# Patient Record
Sex: Male | Born: 1960 | Race: Black or African American | Hispanic: No | Marital: Married | Smoking: Never smoker
Health system: Southern US, Community
[De-identification: ages and names within clinical notes are randomized; demographics above are authoritative.]

## PROBLEM LIST (undated history)

## (undated) DIAGNOSIS — E119 Type 2 diabetes mellitus without complications: Secondary | ICD-10-CM

## (undated) DIAGNOSIS — E669 Obesity, unspecified: Secondary | ICD-10-CM

## (undated) DIAGNOSIS — L02419 Cutaneous abscess of limb, unspecified: Secondary | ICD-10-CM

## (undated) DIAGNOSIS — J40 Bronchitis, not specified as acute or chronic: Secondary | ICD-10-CM

## (undated) DIAGNOSIS — L089 Local infection of the skin and subcutaneous tissue, unspecified: Secondary | ICD-10-CM

## (undated) DIAGNOSIS — L03119 Cellulitis of unspecified part of limb: Secondary | ICD-10-CM

## (undated) DIAGNOSIS — I10 Essential (primary) hypertension: Secondary | ICD-10-CM

---

## 2004-11-04 ENCOUNTER — Emergency Department: Payer: Self-pay | Admitting: Emergency Medicine

## 2005-08-26 ENCOUNTER — Emergency Department: Payer: Self-pay | Admitting: Emergency Medicine

## 2007-06-30 ENCOUNTER — Emergency Department: Payer: Self-pay | Admitting: Internal Medicine

## 2008-12-23 ENCOUNTER — Inpatient Hospital Stay: Payer: Self-pay | Admitting: Specialist

## 2010-09-07 ENCOUNTER — Emergency Department: Payer: Self-pay | Admitting: Unknown Physician Specialty

## 2011-10-12 ENCOUNTER — Emergency Department (INDEPENDENT_AMBULATORY_CARE_PROVIDER_SITE_OTHER)
Admission: EM | Admit: 2011-10-12 | Discharge: 2011-10-12 | Disposition: A | Discharge status: Home / Self Care | Payer: Self-pay | Source: Home / Self Care | Attending: Emergency Medicine | Admitting: Emergency Medicine

## 2011-10-12 ENCOUNTER — Encounter (HOSPITAL_COMMUNITY): Payer: Self-pay

## 2011-10-12 DIAGNOSIS — L089 Local infection of the skin and subcutaneous tissue, unspecified: Secondary | ICD-10-CM

## 2011-10-12 HISTORY — DX: Local infection of the skin and subcutaneous tissue, unspecified: L08.9

## 2011-10-12 HISTORY — DX: Essential (primary) hypertension: I10

## 2011-10-12 MED ORDER — MUPIROCIN 2 % EX OINT: TOPICAL_OINTMENT | Freq: Three times a day (TID) | CUTANEOUS | Status: AC

## 2011-10-12 MED ORDER — SULFAMETHOXAZOLE-TRIMETHOPRIM 800-160 MG PO TABS: 1.0000 | ORAL_TABLET | Freq: Two times a day (BID) | ORAL | Status: AC

## 2011-10-12 MED ORDER — CHLORHEXIDINE GLUCONATE 4 % EX LIQD: 60.0000 mL | Freq: Every day | CUTANEOUS | Status: AC | PRN

## 2011-10-12 NOTE — Discharge Instructions (Signed)
Go to www.goodrx.com to look up your medications. This will give you a list of where you can find your prescriptions at the most affordable prices.   RESOURCE GUIDE   Insufficient Money for Medicine Contact [employer]:  call "211" or [facility] [phone].  No Primary Care Doctor Call [facility]  [phone] Other agencies that provide inexpensive medical care    [facility]  [phone]    [facility]  [phone]    [facility]  [phone]   [facility] [phone]   [address] [place] [address] [place], [place] [address]  [facility] Resources  [facility]     [employer]                          [facility] Dept. [address] [place]                       [address]      [address] [place] [address]   [phone] (After Hours)  .   Call the family practice center first thing tomorrow. They take the first 60 people as patients. You do not need insurance Return here or follow up with a doctor of your choice 2 days for a wound check if you are not getting significantly better. Return sooner if you get worse, have a fever >100.4, or any other concerns. Take the medication as written. Take 1 gram of tylenol with the motrin up to 4 times a day as needed for pain and fever. This is an effective combination for pain. Take the hydrocodone/norco/percocet only for severe pain. Do not take the tylenol and hydrocodone/norco/percocet as they both have tylenol in them and too much can hurt your liver. Return if you get worse, have a persistent fever >100.4, or for any concerns.

## 2011-10-12 NOTE — ED Provider Notes (Signed)
History     CSN: [id number]  Arrival date & time 10/12/11  0865   First MD Initiated Contact with Patient 10/12/11 1918      Chief Complaint  Patient presents with  . Recurrent Skin Infections    (Consider location/radiation/quality/duration/timing/severity/associated sxs/prior treatment) HPI Comments: Patient reports multiple, tender areas of erythema, starting 2 weeks ago. Initial lesion started on right lower abdomen, followed by 2 lesions on his right upper thigh. Has been applying warm compresses, and several these lesions have been draining well. Patient states that they are getting better, but that he is getting another one. Patient has a history of skin infections in this area, he states that it was negative for MRSA. Patient is not diabetic. No nausea, vomiting, fevers. No sensation of being bitten at night  ROS as noted in HPI. All other ROS negative.   Patient is a 51 y.o. male presenting with rash. The history is provided by the patient. No language interpreter was used.  Rash     Past Medical History  Diagnosis Date  . Hypertension   . Skin infection     History reviewed. No pertinent past surgical history.  History reviewed. No pertinent family history.  History  Substance Use Topics  . Smoking status: Never Smoker   . Smokeless tobacco: Not on file  . Alcohol Use: No      Review of Systems  Skin: Positive for rash.    Allergies  Review of patient's allergies indicates no known allergies.  Home Medications   Current Outpatient Rx  Name Route Sig Dispense Refill  . CHLORHEXIDINE GLUCONATE 4 % EX LIQD Topical Apply 60 mLs (4 application total) topically daily as needed. Use daily when bathing for 1-2 weeks 120 mL 0  . MUPIROCIN 2 % EX OINT Topical Apply topically 3 (three) times daily. Apply after warm soak for 10 minutes 22 g 0  . SULFAMETHOXAZOLE-TRIMETHOPRIM 800-160 MG PO TABS Oral Take 1 tablet by mouth 2 (two) times daily. 20 tablet 0    BP  143/89  Pulse 94  Temp(Src) 99.6 F (37.6 C) (Oral)  Resp 20  SpO2 97%  Physical Exam  Nursing note and vitals reviewed. Constitutional: He is oriented to person, place, and time. He appears well-developed and well-nourished.  HENT:  Head: Normocephalic and atraumatic.  Eyes: Conjunctivae and EOM are normal.  Neck: Normal range of motion.  Cardiovascular: Normal rate.   Pulmonary/Chest: Effort normal. No respiratory distress.  Abdominal: He exhibits no distension.  Musculoskeletal: Normal range of motion.  Neurological: He is alert and oriented to person, place, and time.  Skin: Skin is warm and dry.       Several areas of erythema, induration. 2 on right upper thigh, one measuring 2 x 2.5 cm, the other 2 x 5 cm. Both have central scabs. No expressible drainage, central fluctuance. Patient has 2 lesions on right lower abdomen: One measuring 1 x 1.5 cm, the other measuring approximately 2 x 6 cm. Larger lesion has a central, healing ulcer.  Psychiatric: He has a normal mood and affect. His behavior is normal.    ED Course  Procedures (including critical care time)  Labs Reviewed - No data to display No results found.   1. Skin infection       MDM  No previous records available. Patient has multiple skin infections. here does not appear to be anything to I&D at this time. Marked all these with a permanent marker for reference. Will send  home on Bactrim and Bactroban. Will have patient return here if no significant improvement in 2 days. Instructed him to bring the permanent marker with him if he returns and to keep these areas marked for reference.   Luiz Blare, MD 10/12/11 2123

## 2011-10-12 NOTE — ED Notes (Signed)
Skin infections on lower abdominal area x2 and right thigh x 2 for past 2-3 weeks; treated at home w hot compresses and drainage by massage; NAD at present

## 2012-06-05 ENCOUNTER — Emergency Department (HOSPITAL_COMMUNITY)
Admission: EM | Admit: 2012-06-05 | Discharge: 2012-06-05 | Disposition: A | Discharge status: Home / Self Care | Payer: Self-pay | Attending: Emergency Medicine | Admitting: Emergency Medicine

## 2012-06-05 ENCOUNTER — Emergency Department (HOSPITAL_COMMUNITY): Payer: Self-pay

## 2012-06-05 ENCOUNTER — Encounter (HOSPITAL_COMMUNITY): Payer: Self-pay | Admitting: *Deleted

## 2012-06-05 DIAGNOSIS — R509 Fever, unspecified: Secondary | ICD-10-CM | POA: Insufficient documentation

## 2012-06-05 DIAGNOSIS — J40 Bronchitis, not specified as acute or chronic: Secondary | ICD-10-CM | POA: Insufficient documentation

## 2012-06-05 DIAGNOSIS — J3489 Other specified disorders of nose and nasal sinuses: Secondary | ICD-10-CM | POA: Insufficient documentation

## 2012-06-05 DIAGNOSIS — R05 Cough: Secondary | ICD-10-CM | POA: Insufficient documentation

## 2012-06-05 DIAGNOSIS — Z872 Personal history of diseases of the skin and subcutaneous tissue: Secondary | ICD-10-CM | POA: Insufficient documentation

## 2012-06-05 DIAGNOSIS — R059 Cough, unspecified: Secondary | ICD-10-CM | POA: Insufficient documentation

## 2012-06-05 DIAGNOSIS — J069 Acute upper respiratory infection, unspecified: Secondary | ICD-10-CM | POA: Insufficient documentation

## 2012-06-05 DIAGNOSIS — I10 Essential (primary) hypertension: Secondary | ICD-10-CM | POA: Insufficient documentation

## 2012-06-05 MED ORDER — BENZONATATE 100 MG PO CAPS
100.0000 mg | ORAL_CAPSULE | Freq: Once | ORAL | Status: AC
  Administered 2012-06-05: 100 mg via ORAL
  Filled 2012-06-05: qty 1

## 2012-06-05 MED ORDER — BENZONATATE 100 MG PO CAPS
100.0000 mg | ORAL_CAPSULE | Freq: Three times a day (TID) | ORAL | Status: DC
Start: 2012-06-05 — End: 2012-07-08

## 2012-06-05 MED ORDER — ALBUTEROL SULFATE HFA 108 (90 BASE) MCG/ACT IN AERS
2.0000 | INHALATION_SPRAY | RESPIRATORY_TRACT | Status: DC | PRN
  Administered 2012-06-05: 2 via RESPIRATORY_TRACT
  Filled 2012-06-05: qty 6.7

## 2012-06-05 MED ORDER — HYDROCOD POLST-CHLORPHEN POLST 10-8 MG/5ML PO LQCR: 5.0000 mL | Freq: Two times a day (BID) | ORAL | Status: DC

## 2012-06-05 NOTE — ED Notes (Signed)
Pt reports cough for about four to five days.  Stated he has been taking mucinex, robitussin, and cough drops with no relief. Also reports he has been having a productive cough with brown sputum.

## 2012-06-05 NOTE — ED Provider Notes (Signed)
Medical screening examination/treatment/procedure(s) were performed by non-physician practitioner and as supervising physician I was immediately available for consultation/collaboration.  Flint Melter, MD 06/05/12 [phone]

## 2012-06-05 NOTE — ED Notes (Signed)
The pt has had a cough and a cold for 5 days.  He cannot sleep at night  From coughing

## 2012-06-05 NOTE — ED Notes (Signed)
Pt returned from radiology.

## 2012-06-05 NOTE — ED Provider Notes (Signed)
History     CSN: [id number]  Arrival date & time 06/05/12  0128   First MD Initiated Contact with Patient 06/05/12 0145      Chief Complaint  Patient presents with  . Cough   HPI  History provided by the patient. Patient is a 51 year old African American male with history of hypertension who presents with complaints of continued cough with nasal congestion rhinorrhea for the past 5 days. Symptoms have been waxing and waning some with worsening cough symptoms at night when lying down. Patient has been using over-the-counter Robitussin which seems to help during the days but is not working well at night. Over the past one to 2 days patient also has had some episodes of subjective fevers and chills. He has not taken his temperature at home. He denies any known sick contacts. Denies any recent travel. Denies any other aggravating or alleviating factors. Denies any associated vomiting or diarrhea symptoms.    Past Medical History  Diagnosis Date  . Hypertension   . Skin infection     History reviewed. No pertinent past surgical history.  No family history on file.  History  Substance Use Topics  . Smoking status: Never Smoker   . Smokeless tobacco: Not on file  . Alcohol Use: No      Review of Systems  Constitutional: Positive for fever and chills. Negative for diaphoresis and appetite change.  HENT: Positive for congestion and rhinorrhea. Negative for sore throat.   Respiratory: Positive for cough. Negative for shortness of breath.   Cardiovascular: Negative for chest pain and palpitations.  Gastrointestinal: Negative for vomiting, abdominal pain and diarrhea.  Skin: Negative for rash.  All other systems reviewed and are negative.    Allergies  Review of patient's allergies indicates no known allergies.  Home Medications  No current outpatient prescriptions on file.  BP 144/88  Pulse 85  Temp 97.5 F (36.4 C) (Oral)  Resp 18  SpO2 97%  Physical Exam  Nursing  note and vitals reviewed. Constitutional: He is oriented to person, place, and time. He appears well-developed and well-nourished. No distress.  HENT:  Head: Normocephalic.  Right Ear: Tympanic membrane normal.  Left Ear: Tympanic membrane normal.  Mouth/Throat: Oropharynx is clear and moist.  Neck: Normal range of motion. Neck supple.       No meningeal signs  Cardiovascular: Normal rate and regular rhythm.   No murmur heard. Pulmonary/Chest: Breath sounds normal. No respiratory distress. He has no wheezes. He has no rales.       Occasional coughing  Neurological: He is alert and oriented to person, place, and time.  Skin: Skin is warm. No rash noted.  Psychiatric: He has a normal mood and affect. His behavior is normal.    ED Course  Procedures    Dg Chest 2 View  06/05/2012  *RADIOLOGY REPORT*  Clinical Data: Productive cough for 5 days.  Fever.  CHEST - 2 VIEW  Comparison: None.  Findings: The heart size and pulmonary vascularity are normal. The lungs appear clear and expanded without focal air space disease or consolidation. No blunting of the costophrenic angles.  No pneumothorax.  Mediastinal contours appear intact.  Degenerative changes in the spine.  IMPRESSION: No evidence of active pulmonary disease.   Original Report Authenticated By: Burman Nieves, M.D.      1. Cough   2. URI (upper respiratory infection)   3. Bronchitis       MDM  2:05 AM patient seen and evaluated.  Patient appears well in no respiratory distress. Normal respirations and O2 sats.        Angus Seller, PA 06/05/12 0530

## 2012-06-14 ENCOUNTER — Emergency Department (HOSPITAL_COMMUNITY)
Admission: EM | Admit: 2012-06-14 | Discharge: 2012-06-15 | Disposition: A | Discharge status: Home / Self Care | Payer: Self-pay | Attending: Emergency Medicine | Admitting: Emergency Medicine

## 2012-06-14 ENCOUNTER — Encounter (HOSPITAL_COMMUNITY): Payer: Self-pay | Admitting: *Deleted

## 2012-06-14 ENCOUNTER — Emergency Department (HOSPITAL_COMMUNITY): Payer: Self-pay

## 2012-06-14 DIAGNOSIS — L089 Local infection of the skin and subcutaneous tissue, unspecified: Secondary | ICD-10-CM | POA: Insufficient documentation

## 2012-06-14 DIAGNOSIS — J40 Bronchitis, not specified as acute or chronic: Secondary | ICD-10-CM | POA: Insufficient documentation

## 2012-06-14 DIAGNOSIS — I10 Essential (primary) hypertension: Secondary | ICD-10-CM | POA: Insufficient documentation

## 2012-06-14 DIAGNOSIS — Z792 Long term (current) use of antibiotics: Secondary | ICD-10-CM | POA: Insufficient documentation

## 2012-06-14 MED ORDER — AZITHROMYCIN 250 MG PO TABS: 250.0000 mg | ORAL_TABLET | Freq: Every day | ORAL | Status: DC

## 2012-06-14 MED ORDER — LISINOPRIL 10 MG PO TABS: 10.0000 mg | ORAL_TABLET | Freq: Every day | ORAL | Status: DC

## 2012-06-14 MED ORDER — ALBUTEROL SULFATE HFA 108 (90 BASE) MCG/ACT IN AERS: 2.0000 | INHALATION_SPRAY | RESPIRATORY_TRACT | Status: DC | PRN

## 2012-06-14 NOTE — ED Provider Notes (Signed)
History     CSN: [id number]  Arrival date & time 06/14/12  2124   First MD Initiated Contact with Patient 06/14/12 2346      Chief Complaint  Patient presents with  . Cough    (Consider location/radiation/quality/duration/timing/severity/associated sxs/prior treatment) HPI Comments: Pt presents with 1 week of cough, stuffy nose and congestion in his chest - has no fevers, no sore throat. Sx are mild, persistent and not associated with back or chest pain.  Patient is a 51 y.o. male presenting with cough. The history is provided by the patient.  Cough    Past Medical History  Diagnosis Date  . Hypertension   . Skin infection     History reviewed. No pertinent past surgical history.  History reviewed. No pertinent family history.  History  Substance Use Topics  . Smoking status: Never Smoker   . Smokeless tobacco: Not on file  . Alcohol Use: No      Review of Systems  Respiratory: Positive for cough.   All other systems reviewed and are negative.    Allergies  Oxycodone  Home Medications   Current Outpatient Rx  Name  Route  Sig  Dispense  Refill  . ALBUTEROL SULFATE HFA 108 (90 BASE) MCG/ACT IN AERS   Inhalation   Inhale 2 puffs into the lungs every 4 (four) hours as needed for wheezing or shortness of breath.   1 Inhaler   3   . AZITHROMYCIN 250 MG PO TABS   Oral   Take 1 tablet (250 mg total) by mouth daily. 500mg  PO day 1, then 250mg  PO days 205   6 tablet   0   . BENZONATATE 100 MG PO CAPS   Oral   Take 1 capsule (100 mg total) by mouth every 8 (eight) hours.   21 capsule   0   . NAPROXEN SODIUM 220 MG PO TABS   Oral   Take 440 mg by mouth daily as needed. For pain           BP 173/108  Pulse 96  Temp 98.8 F (37.1 C) (Oral)  Resp 20  SpO2 97%  Physical Exam  Nursing note and vitals reviewed. Constitutional: He appears well-developed and well-nourished. No distress.  HENT:  Head: Normocephalic and atraumatic.  Mouth/Throat:  Oropharynx is clear and moist. No oropharyngeal exudate.       + nasal d/c bialterally, OP clear, MMM  Eyes: Conjunctivae normal and EOM are normal. Pupils are equal, round, and reactive to light. Right eye exhibits no discharge. Left eye exhibits no discharge. No scleral icterus.  Neck: Normal range of motion. Neck supple. No JVD present. No thyromegaly present.  Cardiovascular: Normal rate, regular rhythm, normal heart sounds and intact distal pulses.  Exam reveals no gallop and no friction rub.   No murmur heard. Pulmonary/Chest: Effort normal and breath sounds normal. No respiratory distress. He has no wheezes. He has no rales.  Abdominal: Soft. Bowel sounds are normal. He exhibits no distension and no mass. There is no tenderness.  Musculoskeletal: Normal range of motion. He exhibits no edema and no tenderness.  Lymphadenopathy:    He has no cervical adenopathy.  Neurological: He is alert. Coordination normal.  Skin: Skin is warm and dry. No rash noted. No erythema.  Psychiatric: He has a normal mood and affect. His behavior is normal.    ED Course  Procedures (including critical care time)  Labs Reviewed - No data to display Dg Chest 2  View  06/14/2012  *RADIOLOGY REPORT*  Clinical Data: Productive cough.  CHEST - 2 VIEW  Comparison:  06/05/2012  Findings:  The heart size and mediastinal contours are within normal limits.  Both lungs are clear.  The visualized skeletal structures are unremarkable.  IMPRESSION: No active cardiopulmonary disease.   Original Report Authenticated By: Myles Rosenthal, M.D.      1. Bronchitis       MDM  Well appaering, normal VS other than Htn, needs to have this f/u - he does have hx of htn and is currently unmedicated - he agrees to start meds and have f/u BP in next 2 weeks.  Zithromax Lisinopril Albuterol MDI        Vida Roller, MD 06/14/12 2356

## 2012-06-14 NOTE — ED Notes (Signed)
Pt reports having a cough for the past 5 days; pt was febrile earlier this week but not presently; pt reports sputum being brown in the beginning but is now yellow; dry productive cough present on assessment; nad.

## 2012-06-14 NOTE — ED Notes (Signed)
Patient with a week of coughing.  States that he was here a week ago for the same and seems like it getting course and his voice is hoarse.

## 2012-07-08 ENCOUNTER — Encounter (HOSPITAL_COMMUNITY): Payer: Self-pay | Admitting: *Deleted

## 2012-07-08 ENCOUNTER — Emergency Department (HOSPITAL_COMMUNITY): Payer: Self-pay

## 2012-07-08 ENCOUNTER — Emergency Department (HOSPITAL_COMMUNITY)
Admission: EM | Admit: 2012-07-08 | Discharge: 2012-07-08 | Disposition: A | Discharge status: Home / Self Care | Payer: Self-pay | Attending: Emergency Medicine | Admitting: Emergency Medicine

## 2012-07-08 DIAGNOSIS — I10 Essential (primary) hypertension: Secondary | ICD-10-CM | POA: Insufficient documentation

## 2012-07-08 DIAGNOSIS — Z8619 Personal history of other infectious and parasitic diseases: Secondary | ICD-10-CM | POA: Insufficient documentation

## 2012-07-08 DIAGNOSIS — J40 Bronchitis, not specified as acute or chronic: Secondary | ICD-10-CM | POA: Insufficient documentation

## 2012-07-08 DIAGNOSIS — Z791 Long term (current) use of non-steroidal anti-inflammatories (NSAID): Secondary | ICD-10-CM | POA: Insufficient documentation

## 2012-07-08 DIAGNOSIS — J209 Acute bronchitis, unspecified: Secondary | ICD-10-CM

## 2012-07-08 DIAGNOSIS — Z79899 Other long term (current) drug therapy: Secondary | ICD-10-CM | POA: Insufficient documentation

## 2012-07-08 DIAGNOSIS — R062 Wheezing: Secondary | ICD-10-CM | POA: Insufficient documentation

## 2012-07-08 MED ORDER — ALBUTEROL SULFATE (5 MG/ML) 0.5% IN NEBU
5.0000 mg | INHALATION_SOLUTION | Freq: Once | RESPIRATORY_TRACT | Status: AC
  Administered 2012-07-08: 5 mg via RESPIRATORY_TRACT
  Filled 2012-07-08: qty 1

## 2012-07-08 MED ORDER — ALBUTEROL SULFATE HFA 108 (90 BASE) MCG/ACT IN AERS: 1.0000 | INHALATION_SPRAY | Freq: Four times a day (QID) | RESPIRATORY_TRACT | Status: DC | PRN

## 2012-07-08 MED ORDER — PREDNISONE 20 MG PO TABS
60.0000 mg | ORAL_TABLET | Freq: Once | ORAL | Status: AC
  Administered 2012-07-08: 60 mg via ORAL
  Filled 2012-07-08: qty 3

## 2012-07-08 MED ORDER — PREDNISONE 50 MG PO TABS: 50.0000 mg | ORAL_TABLET | Freq: Every day | ORAL | Status: DC

## 2012-07-08 MED ORDER — BENZONATATE 100 MG PO CAPS
100.0000 mg | ORAL_CAPSULE | Freq: Three times a day (TID) | ORAL | Status: DC
Start: 2012-07-08 — End: 2012-09-07

## 2012-07-08 MED ORDER — IPRATROPIUM BROMIDE 0.02 % IN SOLN
0.5000 mg | Freq: Once | RESPIRATORY_TRACT | Status: AC
  Administered 2012-07-08: 0.5 mg via RESPIRATORY_TRACT
  Filled 2012-07-08: qty 2.5

## 2012-07-08 NOTE — ED Notes (Signed)
Pt reports here for same as last time.  States that he has had a cough x several weeks.  Reports that he is coughing up white clear sputum.  Pt has been taking medication as he should.

## 2012-07-08 NOTE — ED Notes (Signed)
Pt reports continuous wheezing and coughing with white mucous. A&Ox4, ambulatory, nad.

## 2012-07-08 NOTE — ED Provider Notes (Signed)
History  This chart was scribed for Christopher Racer, MD by Bennett Scrape, ED Scribe. This patient was seen in room TR09C/TR09C and the patient's care was started at 11:53 AM.   CSN: [id number]  Arrival date & time 07/08/12  1121   First MD Initiated Contact with Patient 07/08/12 1153      Chief Complaint  Patient presents with  . Cough     Patient is a 51 y.o. male presenting with cough. The history is provided by the patient. No language interpreter was used.  Cough This is a new problem. The current episode started more than 1 week ago. The problem occurs constantly. The problem has not changed since onset.The cough is productive of sputum. There has been no fever. Associated symptoms include wheezing. Pertinent negatives include no chills and no shortness of breath.    [name] is a 51 y.o. male who presents to the Emergency Department complaining of one month of gradual onset, non-changing, constant productive cough of white phlegm with associated wheezing that started with a prior URI. The other symptoms have since resolved, but pt reports an on-going cough that is worse at night. He states that he has prior bronchitis diagnoses with similar symptoms, usually once every year. He denies nausea and emesis as associated symptoms. He has a h/o HTN and recently started lisinopril 2 weeks ago. He denies smoking and alcohol use.   Past Medical History  Diagnosis Date  . Hypertension   . Skin infection     History reviewed. No pertinent past surgical history.  History reviewed. No pertinent family history.  History  Substance Use Topics  . Smoking status: Never Smoker   . Smokeless tobacco: Not on file  . Alcohol Use: No      Review of Systems  Constitutional: Negative for fever and chills.  Respiratory: Positive for cough and wheezing. Negative for shortness of breath.   Gastrointestinal: Negative for nausea and vomiting.  All other systems reviewed and are  negative.    Allergies  Oxycodone  Home Medications   Current Outpatient Rx  Name  Route  Sig  Dispense  Refill  . ALBUTEROL SULFATE HFA 108 (90 BASE) MCG/ACT IN AERS   Inhalation   Inhale 2 puffs into the lungs every 6 (six) hours as needed. For shortness of breath         . LISINOPRIL 10 MG PO TABS   Oral   Take 1 tablet (10 mg total) by mouth daily.   30 tablet   1   . NAPROXEN SODIUM 220 MG PO TABS   Oral   Take 440 mg by mouth daily as needed. For pain         . ALBUTEROL SULFATE HFA 108 (90 BASE) MCG/ACT IN AERS   Inhalation   Inhale 1-2 puffs into the lungs every 6 (six) hours as needed for wheezing.   1 Inhaler   0   . BENZONATATE 100 MG PO CAPS   Oral   Take 1 capsule (100 mg total) by mouth every 8 (eight) hours.   21 capsule   0   . PREDNISONE 50 MG PO TABS   Oral   Take 1 tablet (50 mg total) by mouth daily.   5 tablet   0     Triage Vitals: BP 165/102  Pulse 77  Temp 98.1 F (36.7 C) (Oral)  Resp 16  SpO2 98%  Physical Exam  Nursing note and vitals reviewed. Constitutional: He is oriented  to person, place, and time. He appears well-developed and well-nourished. No distress.  HENT:  Head: Normocephalic and atraumatic.  Eyes: EOM are normal.  Neck: Neck supple. No tracheal deviation present.  Cardiovascular: Normal rate and regular rhythm.   Pulmonary/Chest: Effort normal. No respiratory distress. He has wheezes (very mild left-sided expiratory wheeze). He has no rales.       cough heard on expiratory phase  Musculoskeletal: Normal range of motion.  Neurological: He is alert and oriented to person, place, and time.  Skin: Skin is warm and dry.  Psychiatric: He has a normal mood and affect. His behavior is normal.    ED Course  Procedures (including critical care time)  DIAGNOSTIC STUDIES: Oxygen Saturation is 98% on room air, normal by my interpretation.    COORDINATION OF CARE: 12:05 PM- Discussed treatment plan which includes  CXR, albuterol inhaler, steroids and a breathing treatment with pt at bedside and pt agreed to plan.  12:15 PM- Ordered 60 mg prednisone tablet, 5 mg of 0.5% albuterol nebulizer solution and 0.5 mg of Atrovent solution.  1:13 PM- Advised pt of radiology results. Discussed discharge plan which includes an albuterol inhaler and steriods with pt and pt agreed to plan. Also advised pt to follow up and pt agreed.  1:15 PM- Prescribed 50 mg prednisone tablets, 100 mg tessalon capsules and albuterol inhaler  Labs Reviewed - No data to display Dg Chest 2 View  07/08/2012  *RADIOLOGY REPORT*  Clinical Data: Productive cough, congestion.  CHEST - 2 VIEW  Comparison: 06/14/2012  Findings: Mild peribronchial thickening.  Slight right infrahilar density, more prominent since prior study.  This could represent atelectasis or developing infiltrate.  No confluent opacity on the left.  No effusions or acute bony abnormality.  Heart is upper limits normal in size.  IMPRESSION: Bronchitic changes.  Slight right infrahilar atelectasis or infiltrate.   Original Report Authenticated By: Charlett Nose, M.D.      1. Bronchitis with bronchospasm       MDM  I personally performed the services described in this documentation, which was scribed in my presence. The recorded information has been reviewed and is accurate.     Christopher Racer, MD 07/08/12 [phone]

## 2012-07-08 NOTE — ED Notes (Signed)
Pt returned from xray

## 2012-09-07 ENCOUNTER — Emergency Department (HOSPITAL_COMMUNITY): Payer: Self-pay

## 2012-09-07 ENCOUNTER — Encounter (HOSPITAL_COMMUNITY): Payer: Self-pay | Admitting: Adult Health

## 2012-09-07 ENCOUNTER — Emergency Department (HOSPITAL_COMMUNITY)
Admission: EM | Admit: 2012-09-07 | Discharge: 2012-09-07 | Disposition: A | Discharge status: Home / Self Care | Payer: Self-pay | Attending: Emergency Medicine | Admitting: Emergency Medicine

## 2012-09-07 DIAGNOSIS — Z79899 Other long term (current) drug therapy: Secondary | ICD-10-CM | POA: Insufficient documentation

## 2012-09-07 DIAGNOSIS — R220 Localized swelling, mass and lump, head: Secondary | ICD-10-CM | POA: Insufficient documentation

## 2012-09-07 DIAGNOSIS — J40 Bronchitis, not specified as acute or chronic: Secondary | ICD-10-CM | POA: Insufficient documentation

## 2012-09-07 DIAGNOSIS — IMO0002 Reserved for concepts with insufficient information to code with codable children: Secondary | ICD-10-CM | POA: Insufficient documentation

## 2012-09-07 DIAGNOSIS — J209 Acute bronchitis, unspecified: Secondary | ICD-10-CM

## 2012-09-07 DIAGNOSIS — R05 Cough: Secondary | ICD-10-CM | POA: Insufficient documentation

## 2012-09-07 DIAGNOSIS — Z872 Personal history of diseases of the skin and subcutaneous tissue: Secondary | ICD-10-CM | POA: Insufficient documentation

## 2012-09-07 DIAGNOSIS — K1120 Sialoadenitis, unspecified: Secondary | ICD-10-CM | POA: Insufficient documentation

## 2012-09-07 DIAGNOSIS — I10 Essential (primary) hypertension: Secondary | ICD-10-CM | POA: Insufficient documentation

## 2012-09-07 DIAGNOSIS — R059 Cough, unspecified: Secondary | ICD-10-CM | POA: Insufficient documentation

## 2012-09-07 LAB — CBC
HCT: 40.3 % (ref 39.0–52.0)
Hemoglobin: 14 g/dL (ref 13.0–17.0)
MCH: 29.9 pg (ref 26.0–34.0)
MCHC: 34.7 g/dL (ref 30.0–36.0)
MCV: 86.1 fL (ref 78.0–100.0)
RBC: 4.68 MIL/uL (ref 4.22–5.81)

## 2012-09-07 LAB — BASIC METABOLIC PANEL
BUN: 12 mg/dL (ref 6–23)
CO2: 31 mEq/L (ref 19–32)
Calcium: 8.9 mg/dL (ref 8.4–10.5)
Chloride: 103 mEq/L (ref 96–112)
Creatinine, Ser: 0.72 mg/dL (ref 0.50–1.35)
GFR calc Af Amer: >90 mL/min (ref >90)
GFR calc non Af Amer: >90 mL/min (ref >90)
Glucose, Bld: 109 mg/dL — ABNORMAL HIGH (ref 70–99)
Potassium: 3.8 mEq/L (ref 3.5–5.1)
Sodium: 140 mEq/L (ref 135–145)

## 2012-09-07 LAB — POCT I-STAT TROPONIN I: Troponin i, poc: 0.01 ng/mL (ref 0.00–0.08)

## 2012-09-07 MED ORDER — IPRATROPIUM BROMIDE 0.02 % IN SOLN
0.5000 mg | Freq: Once | RESPIRATORY_TRACT | Status: AC
  Administered 2012-09-07: 0.5 mg via RESPIRATORY_TRACT
  Filled 2012-09-07: qty 2.5

## 2012-09-07 MED ORDER — ALBUTEROL SULFATE HFA 108 (90 BASE) MCG/ACT IN AERS: 1.0000 | INHALATION_SPRAY | Freq: Four times a day (QID) | RESPIRATORY_TRACT | Status: DC | PRN

## 2012-09-07 MED ORDER — ALBUTEROL SULFATE (5 MG/ML) 0.5% IN NEBU
5.0000 mg | INHALATION_SOLUTION | Freq: Once | RESPIRATORY_TRACT | Status: AC
  Administered 2012-09-07: 5 mg via RESPIRATORY_TRACT
  Filled 2012-09-07: qty 1

## 2012-09-07 MED ORDER — PREDNISONE 20 MG PO TABS
60.0000 mg | ORAL_TABLET | Freq: Once | ORAL | Status: AC
  Administered 2012-09-07: 60 mg via ORAL
  Filled 2012-09-07: qty 3

## 2012-09-07 MED ORDER — PREDNISONE 50 MG PO TABS: 50.0000 mg | ORAL_TABLET | Freq: Every day | ORAL | Status: DC

## 2012-09-07 MED ORDER — IOHEXOL 300 MG/ML  SOLN
75.0000 mL | Freq: Once | INTRAMUSCULAR | Status: AC | PRN
  Administered 2012-09-07: 75 mL via INTRAVENOUS

## 2012-09-07 NOTE — ED Notes (Signed)
Patients O2 sat down to 81%  Repositioned patient  Wheezing noted while resting.

## 2012-09-07 NOTE — ED Notes (Signed)
Patient complains of SOB since Tuesday. Inspiratory and expiratory Wheezing in all sections of lungs. Patient also complains of right sided facial swelling in cheeks and neck area.  Swelling does not appear to be affecting airway or speech.  Patient stated swelling has gotten progressively worse.

## 2012-09-07 NOTE — ED Provider Notes (Signed)
History    CSN: [id number] Arrival date & time 09/07/12  0029 First MD Initiated Contact with Patient 09/07/12 0049      Chief Complaint  Patient presents with  . Shortness of Breath    HPI Patient presents emergency room with complaints of shortness of breath.  The symptoms started yesterday. He's also been having trouble with cough and he has had some wheezing. Patient has history of bronchitis and takes inhalers occasionally but has never been formally diagnosed with asthma. He denies any smoking history. Patient denies any chest pain, nausea vomiting or diarrhea. He also has noticed some trouble with swelling that started on the right side of his face this afternoon. He does have a bad tooth in that area but has not noticed any drainage and does not feel any pain in the neck area where it is swollen. Past Medical History  Diagnosis Date  . Hypertension   . Skin infection     History reviewed. No pertinent past surgical history.  History reviewed. No pertinent family history.  History  Substance Use Topics  . Smoking status: Never Smoker   . Smokeless tobacco: Not on file  . Alcohol Use: No      Review of Systems  All other systems reviewed and are negative.    Allergies  Oxycodone  Home Medications   Current Outpatient Rx  Name  Route  Sig  Dispense  Refill  . albuterol (PROVENTIL HFA;VENTOLIN HFA) 108 (90 BASE) MCG/ACT inhaler   Inhalation   Inhale 2 puffs into the lungs every 6 (six) hours as needed. For shortness of breath         . albuterol (PROVENTIL HFA;VENTOLIN HFA) 108 (90 BASE) MCG/ACT inhaler   Inhalation   Inhale 1-2 puffs into the lungs every 6 (six) hours as needed for wheezing.   1 Inhaler   0   . benzonatate (TESSALON) 100 MG capsule   Oral   Take 1 capsule (100 mg total) by mouth every 8 (eight) hours.   21 capsule   0   . lisinopril (PRINIVIL,ZESTRIL) 10 MG tablet   Oral   Take 1 tablet (10 mg total) by mouth daily.   30 tablet  1   . naproxen sodium (ANAPROX) 220 MG tablet   Oral   Take 440 mg by mouth daily as needed. For pain         . predniSONE (DELTASONE) 50 MG tablet   Oral   Take 1 tablet (50 mg total) by mouth daily.   5 tablet   0     There were no vitals taken for this visit.  Physical Exam  Nursing note and vitals reviewed. Constitutional: No distress.  Obese  HENT:  Head: Normocephalic and atraumatic.  Right Ear: External ear normal.  Left Ear: External ear normal.  Mouth/Throat: No oropharyngeal exudate.  Dental caries noted, no purulent drainage; induration and swelling right submandibular region, no tenderness or erythema,   Eyes: Conjunctivae are normal. Right eye exhibits no discharge. Left eye exhibits no discharge. No scleral icterus.  Neck: Neck supple. No tracheal deviation present.  Cardiovascular: Normal rate, regular rhythm and intact distal pulses.   Pulmonary/Chest: Effort normal. No accessory muscle usage or stridor. Not tachypneic. No respiratory distress. Decreased breath sounds: end expiration. He has wheezes. He has no rales.  No stridor, trachea midline,   Abdominal: Soft. Bowel sounds are normal. He exhibits no distension. There is no tenderness. There is no rebound and no guarding.  Musculoskeletal: He exhibits no edema and no tenderness.  Neurological: He is alert. He has normal strength. No sensory deficit. Cranial nerve deficit:  no gross defecits noted. He exhibits normal muscle tone. He displays no seizure activity. Coordination normal.  Skin: Skin is warm and dry. No rash noted.  Psychiatric: He has a normal mood and affect.    ED Course  Procedures (including critical care time)  Labs Reviewed  BASIC METABOLIC PANEL - Abnormal; Notable for the following:    Glucose, Bld 109 (*)    All other components within normal limits  CBC - Abnormal; Notable for the following:    Platelets 144 (*)    All other components within normal limits  POCT I-STAT  TROPONIN I   Ct Soft Tissue Neck W Contrast  09/07/2012  *RADIOLOGY REPORT*  Clinical Data: Shortness of breath since Tuesday.  Right facial swelling to the cheek and neck.  Bilateral wheezing.  CT NECK WITH CONTRAST  Technique:  Multidetector CT imaging of the neck was performed with intravenous contrast.  Contrast: 75mL OMNIPAQUE IOHEXOL 300 MG/ML  SOLN  Comparison: None.  Findings: There is mucosal thickening in the maxillary antra on the right and in the ethmoid air cells bilaterally.  No acute air-fluid levels in the paranasal sinuses.  There is asymmetric enlargement of the right parotid gland with infiltration in the subcutaneous fat of the right side of the neck mg centered in the parotid region.  Increased pain and pain and this extends down to the thoracic inlet.  The appearance suggests parotid infection, possibly with cellulitis.  There is no focal abscess within the collection demonstrated.  No significant lymphadenopathy in the neck.  The cervical carotid and jugular vessels are patent and nondisplaced.  The prevertebral and mucosal spaces appear intact. No prevertebral soft tissue swelling.  Epiglottis and aryepiglottic folds are normal.  Homogeneous appearance of the thyroid gland. Lung apices are clear.  Degenerative changes in the cervical spine.  IMPRESSION: Enlarged right parotid gland with soft tissue infiltration and stranding throughout the subcutaneous fat over the right side of the neck.  Changes are consistent with inflammation/infection of the parotid with associated soft tissue reaction versus cellulitis. No drainable fluid collections are demonstrated.   Original Report Authenticated By: Burman Nieves, M.D.      1. Bronchitis with bronchospasm   2. Parotiditis       MDM  Dyspnea Consistent with bronchitis with bronchospasm.  Pt has had episodes like this before but not formally diagnosed with asthma.  Will dc home with albuterol and prednisone.  Parotiditis Doubt  bacterial infection.  No ttp.  Sour lemon Jurek.  Follow up with PCP for recheck.  Monitor for fever, worsening symptoms        Celene Kras, MD 09/07/12 [phone]

## 2012-09-07 NOTE — ED Notes (Signed)
Presents with SOB since Tuesday, denies pain reports cough. Right side of face swelling and induration to cheek and neck. Pt is speaking in full sentences, bilateral inspiratory and expiratory wheezes.  SOb is worse with exertion. Right facial swelling began this afternooon and has gotten progressively larger.

## 2012-10-21 ENCOUNTER — Encounter (HOSPITAL_COMMUNITY): Payer: Self-pay | Admitting: Emergency Medicine

## 2012-10-21 ENCOUNTER — Telehealth (HOSPITAL_COMMUNITY): Payer: Self-pay | Admitting: Emergency Medicine

## 2012-10-21 ENCOUNTER — Emergency Department (HOSPITAL_COMMUNITY)
Admission: EM | Admit: 2012-10-21 | Discharge: 2012-10-21 | Disposition: A | Discharge status: Home / Self Care | Payer: Self-pay | Attending: Emergency Medicine | Admitting: Emergency Medicine

## 2012-10-21 DIAGNOSIS — I10 Essential (primary) hypertension: Secondary | ICD-10-CM | POA: Insufficient documentation

## 2012-10-21 DIAGNOSIS — H109 Unspecified conjunctivitis: Secondary | ICD-10-CM | POA: Insufficient documentation

## 2012-10-21 DIAGNOSIS — E669 Obesity, unspecified: Secondary | ICD-10-CM | POA: Insufficient documentation

## 2012-10-21 DIAGNOSIS — Z872 Personal history of diseases of the skin and subcutaneous tissue: Secondary | ICD-10-CM | POA: Insufficient documentation

## 2012-10-21 DIAGNOSIS — Z79899 Other long term (current) drug therapy: Secondary | ICD-10-CM | POA: Insufficient documentation

## 2012-10-21 HISTORY — DX: Obesity, unspecified: E66.9

## 2012-10-21 MED ORDER — NEOMYCIN-POLYMYXIN-HC 3.5-10000-1 OP SUSP: 2.0000 [drp] | Freq: Four times a day (QID) | OPHTHALMIC | Status: DC

## 2012-10-21 NOTE — Progress Notes (Signed)
   CARE MANAGEMENT ED NOTE 10/21/2012  Patient:  [name], [name]   Account Number:  [id number]  Date Initiated:  10/21/2012  Documentation initiated by:  Fransico Michael  Subjective/Objective Assessment:   presented to ED with c/o bilateral eye redness and pain early this am.     Subjective/Objective Assessment Detail:     Action/Plan:   requesting assistance with medications.   Action/Plan Detail:   Anticipated DC Date:  10/21/2012     Status Recommendation to Physician:   Result of Recommendation:      DC Planning Services  CM consult  [facility] Program    Choice offered to / List presented to:            Status of service:  Completed, signed off  ED Comments:   ED Comments Detail:  10/21/12-1331-J.Cindie Rajagopalan,RN,BSN [phone]        Patient enrolled in [facility] program. Letter printed and left with triage nurse for patient to pick up. No further needs identified.  10/21/12-1323-J.Taliya Mcclard,RN,BSN  [phone]      Called Mr. Wise at [phone] regarding referral. States, " I recently lost my health insurance and have been to several pharmacies trying to find this medicine for less than $100 and can't. I just can't afford it." Explained the [facility] program to patient. Voiced interest in enrolling. Also explained that it is only accessible once a year and that he would have a $3 copay and letter for patient to give with prescription. Voiced understanding. Will enroll patient.  10/21/12-1259-J.Chantil Bari,RN,BSN [phone]      Received call from flow manager with referral for medication assistance for patient who was seen in ED last night.

## 2012-10-21 NOTE — ED Notes (Signed)
Pt reports bilateral eye drainage and inflammation x 3 days. States he has "pink eye".

## 2012-10-21 NOTE — ED Notes (Signed)
PT. REPORTS BILATERAL RED EYES FOR 2 DAYS WITH SLIGHT BLURRED VISION , DENIES INJURY , NO DRAINAGE .

## 2012-10-21 NOTE — ED Provider Notes (Signed)
History     CSN: [id number]  Arrival date & time 10/21/12  0131   None     Chief Complaint  Patient presents with  . Conjunctivitis    (Consider location/radiation/quality/duration/timing/severity/associated sxs/prior treatment) The history is provided by the patient. No language interpreter was used.    SUBJECTIVE:  52 y.o. male with  redness, wattering, discharge and mattering in both eyes for 2 days.  No other symptoms.  No significant prior ophthalmological history. No change in visual acuity, no photophobia, no severe eye pain. No history of previous, no history of allergies.  Denies injury or chemical exposure.       Past Medical History  Diagnosis Date  . Hypertension   . Skin infection   . Obesity     History reviewed. No pertinent past surgical history.  No family history on file.  History  Substance Use Topics  . Smoking status: Never Smoker   . Smokeless tobacco: Not on file  . Alcohol Use: No      Review of Systems  Eyes: Positive for discharge and redness. Negative for photophobia, pain, itching and visual disturbance.    Allergies  Oxycodone  Home Medications   Current Outpatient Rx  Name  Route  Sig  Dispense  Refill  . albuterol (PROVENTIL HFA;VENTOLIN HFA) 108 (90 BASE) MCG/ACT inhaler   Inhalation   Inhale 1-2 puffs into the lungs every 6 (six) hours as needed for wheezing.   1 Inhaler   0   . lisinopril (PRINIVIL,ZESTRIL) 10 MG tablet   Oral   Take 1 tablet (10 mg total) by mouth daily.   30 tablet   1   . naproxen sodium (ANAPROX) 220 MG tablet   Oral   Take 220 mg by mouth 2 (two) times daily as needed (for pain).           BP 143/75  Pulse 78  Temp(Src) 97.6 F (36.4 C) (Oral)  Resp 18  SpO2 99%  Physical Exam CV: RRR, No M/R/G, Peripheral pulses intact. No peripheral edema. Lungs: CTAB Abd: Soft, Non tender, non distended Eyes: both eyes with findings of typical conjunctivitis noted; erythema and discharge.  PERRLA, no foreign body noted. No periorbital cellulitis. The corneas are clear and fundi normal. Visual acuity normal.   ED Course  Procedures (including critical care time)  Labs Reviewed - No data to display No results found.   No diagnosis found.    MDM  Patient with conjunctivitis.  He denies any irritation.  Symptoms consistent with conjunctivitis.  Will discharge with antibiotic eyedrops, although this is likely viral in origin. Patient has no eye discomfort or irritation. No history of HSV, no vesicular eruptions on face or nose. Personal hygiene and frequent handwashing discussed.  Patient advised to followup with ophthalmologist if symptoms persist or worsen in any way including vision change or purulent discharge.  Patient verbalizes understanding and is agreeable with discharge.         Arthor Captain, PA-C 10/21/12 1603

## 2012-10-22 NOTE — ED Provider Notes (Signed)
Medical screening examination/treatment/procedure(s) were performed by non-physician practitioner and as supervising physician I was immediately available for consultation/collaboration.   Hanley Seamen, MD 10/22/12 [id number]

## 2013-03-21 ENCOUNTER — Encounter (HOSPITAL_COMMUNITY): Payer: Self-pay

## 2013-03-21 ENCOUNTER — Emergency Department (HOSPITAL_COMMUNITY)
Admission: EM | Admit: 2013-03-21 | Discharge: 2013-03-21 | Disposition: A | Discharge status: Home / Self Care | Payer: Self-pay | Attending: Emergency Medicine | Admitting: Emergency Medicine

## 2013-03-21 ENCOUNTER — Emergency Department (HOSPITAL_COMMUNITY): Payer: Self-pay

## 2013-03-21 DIAGNOSIS — Z79899 Other long term (current) drug therapy: Secondary | ICD-10-CM | POA: Insufficient documentation

## 2013-03-21 DIAGNOSIS — Z872 Personal history of diseases of the skin and subcutaneous tissue: Secondary | ICD-10-CM | POA: Insufficient documentation

## 2013-03-21 DIAGNOSIS — I10 Essential (primary) hypertension: Secondary | ICD-10-CM | POA: Insufficient documentation

## 2013-03-21 DIAGNOSIS — E669 Obesity, unspecified: Secondary | ICD-10-CM | POA: Insufficient documentation

## 2013-03-21 DIAGNOSIS — J40 Bronchitis, not specified as acute or chronic: Secondary | ICD-10-CM | POA: Insufficient documentation

## 2013-03-21 HISTORY — DX: Bronchitis, not specified as acute or chronic: J40

## 2013-03-21 MED ORDER — CETIRIZINE HCL 10 MG PO TABS: 10.0000 mg | ORAL_TABLET | Freq: Every day | ORAL | Status: DC

## 2013-03-21 MED ORDER — ALBUTEROL SULFATE HFA 108 (90 BASE) MCG/ACT IN AERS
2.0000 | INHALATION_SPRAY | RESPIRATORY_TRACT | Status: DC | PRN
  Administered 2013-03-21: 2 via RESPIRATORY_TRACT
  Filled 2013-03-21: qty 6.7

## 2013-03-21 MED ORDER — GUAIFENESIN 100 MG/5ML PO LIQD: 100.0000 mg | ORAL | Status: DC | PRN

## 2013-03-21 NOTE — ED Provider Notes (Signed)
CSN: [id number]     Arrival date & time 03/21/13  1718 History  This chart was scribed for non-physician practitioner Roxy Horseman, PA-C working with [name]. Oletta Lamas, MD by Danella Maiers, ED Scribe. This patient was seen in room TR10C/TR10C and the patient's care was started at 5:59 PM.    Chief Complaint  Patient presents with  . Bronchitis   The history is provided by the patient. No language interpreter was used.   HPI Comments: [name] is a 52 y.o. male with a history of bronchitis who presents to the Emergency Department complaining of a productive cough with greenish-yellow sputum onset 2 days ago. He reports associated waxing and waning fever at home. He states his symptoms feel the same as when he had bronchitis before and that this is his 3rd time having bronchitis. He hasnt tried anything for the cough but states he is planning on getting Mucinex.   Past Medical History  Diagnosis Date  . Hypertension   . Skin infection   . Obesity   . Bronchitis    History reviewed. No pertinent past surgical history. History reviewed. No pertinent family history. History  Substance Use Topics  . Smoking status: Never Smoker   . Smokeless tobacco: Not on file  . Alcohol Use: No    Review of Systems  All other systems reviewed and are negative.    Allergies  Oxycodone  Home Medications   Current Outpatient Rx  Name  Route  Sig  Dispense  Refill  . albuterol (PROVENTIL HFA;VENTOLIN HFA) 108 (90 BASE) MCG/ACT inhaler   Inhalation   Inhale 1-2 puffs into the lungs every 6 (six) hours as needed for wheezing.   1 Inhaler   0   . lisinopril (PRINIVIL,ZESTRIL) 10 MG tablet   Oral   Take 1 tablet (10 mg total) by mouth daily.   30 tablet   1   . naproxen sodium (ANAPROX) 220 MG tablet   Oral   Take 220 mg by mouth 2 (two) times daily as needed (for pain).         Marland Kitchen neomycin-polymyxin-hydrocortisone (CORTISPORIN) 3.5-10000-1 ophthalmic suspension   Both Eyes    Place 2 drops into both eyes 4 (four) times daily.   7.5 mL   0    BP 162/95  Pulse 90  Temp(Src) 99 F (37.2 C) (Oral)  Resp 18  Ht 5\' 10"  (1.778 m)  Wt 354 lb 3 oz (160.658 kg)  BMI 50.82 kg/m2  SpO2 97% Physical Exam  Nursing note and vitals reviewed. Constitutional: He is oriented to person, place, and time. He appears well-developed and well-nourished. No distress.  HENT:  Head: Normocephalic and atraumatic.  Eyes: EOM are normal.  Neck: Neck supple. No tracheal deviation present.  Cardiovascular: Normal rate, regular rhythm and normal heart sounds.  Exam reveals no gallop and no friction rub.   No murmur heard. Pulmonary/Chest: Effort normal and breath sounds normal. No respiratory distress. He has no wheezes. He has no rales. He exhibits no tenderness.  Musculoskeletal: Normal range of motion.  Neurological: He is alert and oriented to person, place, and time.  Skin: Skin is warm and dry.  Psychiatric: He has a normal mood and affect. His behavior is normal.    ED Course  Procedures (including critical care time) Medications - No data to display  DIAGNOSTIC STUDIES: Oxygen Saturation is 97% on room air, normal by my interpretation.    COORDINATION OF CARE: 6:27 PM- Discussed treatment plan  with pt which includes a CXR. Advised the pt that if he has bronchitis he will be given an inhaler and mucinex. If he has pneumonia he will be given antibiotics. Pt agrees to plan.    Labs Review Labs Reviewed - No data to display Imaging Review Dg Chest 2 View  03/21/2013   *RADIOLOGY REPORT*  Clinical Data: History of bronchitis, hypertension, congestion for 2 days.  CHEST - 2 VIEW  Comparison: 07/08/2012  Findings: Heart size is normal.  There is mild perihilar peribronchial thickening.  There are no focal consolidations or pleural effusions.  Mild mid thoracic degenerative changes are present.  IMPRESSION:  1.  Bronchitic changes. 2. No focal pulmonary abnormality.   Original  Report Authenticated By: Norva Pavlov, M.D.    MDM   1. Bronchitis    Uncomplicated bronchitis.  Inhaler, mucinex, rest and fluids.   I personally performed the services described in this documentation, which was scribed in my presence. The recorded information has been reviewed and is accurate.    Roxy Horseman, PA-C 03/21/13 1939

## 2013-03-21 NOTE — ED Notes (Signed)
Pt c.o productive cough with green/yellow colored mucus x2 days. Pt has a hx of bronchitis and states his symptoms feel the same

## 2013-03-23 NOTE — ED Provider Notes (Signed)
Medical screening examination/treatment/procedure(s) were performed by non-physician practitioner and as supervising physician I was immediately available for consultation/collaboration.  Danasha Melman Y. Koa Palla, MD 03/23/13 2157 

## 2013-09-10 ENCOUNTER — Emergency Department (HOSPITAL_COMMUNITY)
Admission: EM | Admit: 2013-09-10 | Discharge: 2013-09-10 | Disposition: A | Discharge status: Home / Self Care | Payer: Self-pay | Attending: Emergency Medicine | Admitting: Emergency Medicine

## 2013-09-10 ENCOUNTER — Emergency Department (HOSPITAL_COMMUNITY): Payer: Self-pay

## 2013-09-10 ENCOUNTER — Encounter (HOSPITAL_COMMUNITY): Payer: Self-pay | Admitting: Emergency Medicine

## 2013-09-10 DIAGNOSIS — Z872 Personal history of diseases of the skin and subcutaneous tissue: Secondary | ICD-10-CM | POA: Insufficient documentation

## 2013-09-10 DIAGNOSIS — R071 Chest pain on breathing: Secondary | ICD-10-CM | POA: Insufficient documentation

## 2013-09-10 DIAGNOSIS — I10 Essential (primary) hypertension: Secondary | ICD-10-CM | POA: Insufficient documentation

## 2013-09-10 DIAGNOSIS — R0789 Other chest pain: Secondary | ICD-10-CM

## 2013-09-10 DIAGNOSIS — Z79899 Other long term (current) drug therapy: Secondary | ICD-10-CM | POA: Insufficient documentation

## 2013-09-10 LAB — BASIC METABOLIC PANEL
BUN: 8 mg/dL (ref 6–23)
CALCIUM: 9.2 mg/dL (ref 8.4–10.5)
CO2: 26 mEq/L (ref 19–32)
Chloride: 100 mEq/L (ref 96–112)
Creatinine, Ser: 0.64 mg/dL (ref 0.50–1.35)
Glucose, Bld: 105 mg/dL — ABNORMAL HIGH (ref 70–99)
POTASSIUM: 4 meq/L (ref 3.7–5.3)
SODIUM: 135 meq/L — AB (ref 137–147)

## 2013-09-10 LAB — CBC
HEMATOCRIT: 39 % (ref 39.0–52.0)
Hemoglobin: 13.6 g/dL (ref 13.0–17.0)
MCH: 31 pg (ref 26.0–34.0)
MCHC: 34.9 g/dL (ref 30.0–36.0)
MCV: 88.8 fL (ref 78.0–100.0)
Platelets: 143 10*3/uL — ABNORMAL LOW (ref 150–400)
RBC: 4.39 MIL/uL (ref 4.22–5.81)
RDW: 13.1 % (ref 11.5–15.5)
WBC: 7.2 10*3/uL (ref 4.0–10.5)

## 2013-09-10 LAB — TROPONIN I: Troponin I: <0.3 ng/mL (ref <0.30)

## 2013-09-10 MED ORDER — METHOCARBAMOL 500 MG PO TABS: 500.0000 mg | ORAL_TABLET | Freq: Two times a day (BID) | ORAL | Status: DC

## 2013-09-10 MED ORDER — ASPIRIN 81 MG PO CHEW
324.0000 mg | CHEWABLE_TABLET | Freq: Once | ORAL | Status: AC
  Administered 2013-09-10: 324 mg via ORAL
  Filled 2013-09-10: qty 4

## 2013-09-10 MED ORDER — MORPHINE SULFATE 4 MG/ML IJ SOLN: 4.0000 mg | Freq: Once | INTRAMUSCULAR | Status: DC

## 2013-09-10 MED ORDER — ONDANSETRON 4 MG PO TBDP
4.0000 mg | ORAL_TABLET | Freq: Once | ORAL | Status: AC
  Administered 2013-09-10: 4 mg via ORAL
  Filled 2013-09-10: qty 1

## 2013-09-10 MED ORDER — MORPHINE SULFATE 4 MG/ML IJ SOLN
4.0000 mg | Freq: Once | INTRAMUSCULAR | Status: AC
Start: 2013-09-10 — End: 2013-09-10
  Administered 2013-09-10: 4 mg via INTRAMUSCULAR
  Filled 2013-09-10: qty 1

## 2013-09-10 MED ORDER — LISINOPRIL 20 MG PO TABS: 20.0000 mg | ORAL_TABLET | Freq: Every day | ORAL | Status: DC

## 2013-09-10 NOTE — ED Notes (Addendum)
Pt c/o right upper chest pain, describes it as feeling sore and sometimes like a cramp, pain only comes with movement. Pain increases with palpation. Pt reports it started yesterday, denies taking any OTC meds for it. Nad, skin warm and dry, resp e/u.

## 2013-09-10 NOTE — ED Notes (Signed)
Pt c/o right sided chest pain x 2 days. Pt also reports abdominal cramping. Pt has not taken his BP meds in 3-4 months.

## 2013-09-10 NOTE — ED Notes (Signed)
Pt ambulated to restroom, no difficulties. Now talking on phone.

## 2013-09-10 NOTE — ED Provider Notes (Signed)
CSN: [id number]     Arrival date & time 09/10/13  1013 History   First MD Initiated Contact with Patient 09/10/13 1113     Chief Complaint  Patient presents with  . Chest Pain     (Consider location/radiation/quality/duration/timing/severity/associated sxs/prior Treatment) HPI  [name] is a 53 y.o.male with a significant PMH of morbid obesity,hyperlipidemia and hypertension presents to the ER with complaints of right sided chest soreness for two days. He reports that he works for [employer] and has  Only had one day off in 30 days. He says while at work his right pectoral muscle cramped up really tight, this lasted for a couple minutes and then resolved. He reports that he has been getting charlie horses in his abdomen and his extremities lately as well. Since it resolved he has had chest wall tenderness and soreness with movement, pain with exertion. He denies feeling weak, nauseous, diaphoretic, or "ill" when he had the initial pains. He denies ever having chest pains. He denies ever seeing a cardiologist. Denies family history. He has not taken his BP medication in 4 months, he reports no insurance and his prescription ran out. He currently feels mild soreness to the right chest.  Past Medical History  Diagnosis Date  . Hypertension   . Skin infection   . Obesity   . Bronchitis    History reviewed. No pertinent past surgical history. No family history on file. History  Substance Use Topics  . Smoking status: Never Smoker   . Smokeless tobacco: Not on file  . Alcohol Use: No    Review of Systems  The patient denies anorexia, fever, weight loss,, vision loss, decreased hearing, hoarseness, syncope, dyspnea on exertion, peripheral edema, balance deficits, hemoptysis, abdominal pain, melena, hematochezia, severe indigestion/heartburn, hematuria, incontinence, genital sores, muscle weakness, suspicious skin lesions, transient blindness, difficulty walking, depression, unusual weight  change, abnormal bleeding, enlarged lymph nodes, angioedema, and breast masses.   Allergies  Oxycodone  Home Medications   Current Outpatient Rx  Name  Route  Sig  Dispense  Refill  . albuterol (PROVENTIL HFA;VENTOLIN HFA) 108 (90 BASE) MCG/ACT inhaler   Inhalation   Inhale 1-2 puffs into the lungs every 6 (six) hours as needed for wheezing.   1 Inhaler   0    BP 149/78  Pulse 71  Temp(Src) 98.6 F (37 C) (Oral)  Resp 22  Ht 5\' 10"  (1.778 m)  Wt 373 lb 5 oz (169.333 kg)  BMI 53.56 kg/m2  SpO2 97% Physical Exam  Nursing note and vitals reviewed. Constitutional: He appears well-developed and well-nourished. No distress.  HENT:  Head: Normocephalic and atraumatic.  Eyes: Pupils are equal, round, and reactive to light.  Neck: Normal range of motion. Neck supple.  Cardiovascular: Normal rate and regular rhythm.   Pulmonary/Chest: Effort normal. He has no decreased breath sounds. He has no wheezes. He has no rhonchi. He exhibits tenderness (to palpation of the right pectoral muscle.).  Abdominal: Soft.  Musculoskeletal:       Right shoulder: He exhibits tenderness and pain. He exhibits normal range of motion (pain with ROM to right pectoral muscle), no bony tenderness, no crepitus, no spasm, normal pulse and normal strength.       Arms: Neurological: He is alert.  Skin: Skin is warm and dry.      ED Course  Procedures (including critical care time) Labs Review Labs Reviewed  CBC - Abnormal; Notable for the following:    Platelets 143 (*)  All other components within normal limits  BASIC METABOLIC PANEL - Abnormal; Notable for the following:    Sodium 135 (*)    Glucose, Bld 105 (*)    All other components within normal limits  TROPONIN I   Imaging Review Dg Chest 2 View (if Patient Has Fever And/or Copd)  09/10/2013   CLINICAL DATA:  Chest pressure and pain.  Hypertension.  Diabetic.  EXAM: CHEST  2 VIEW  COMPARISON:  DG CHEST 2 VIEW dated 03/21/2013; DG CHEST 2  VIEW dated 06/05/2012  FINDINGS: Lateral view degraded by patient arm position. Moderate lower thoracic spondylosis. Midline trachea. Borderline cardiomegaly. Mediastinal contours otherwise within normal limits. No pleural effusion or pneumothorax. Low lung volumes with resultant pulmonary interstitial prominence.  IMPRESSION: Borderline cardiomegaly and low lung volumes. No acute findings.   Electronically Signed   By: Jeronimo Greaves M.D.   On: 09/10/2013 11:19     EKG Interpretation   Date/Time:  Sunday September 10 2013 10:15:27 EST Ventricular Rate:  87 PR Interval:  138 QRS Duration: 84 QT Interval:  350 QTC Calculation: 421 R Axis:   60 Text Interpretation:  Normal sinus rhythm T wave abnormality, consider  inferior ischemia Abnormal ECG since last tracing no significant change  Confirmed by Effie Shy  MD, Mechele Collin ([id number]) on 09/10/2013 2:23:38 PM      MDM   Final diagnoses:  Chest wall pain  Hypertension    Discussed case with Dr. Effie Shy and reviewed results. Patients pain is very atypical for cardiac chest pain. He needs to see his PCP for follow-up and further management. Will give him a referral. Will start him on Lisinopril and give his a muscle relaxer.  52 y.o.Refujio Pion's evaluation in the Emergency Department is complete. It has been determined that no acute conditions requiring further emergency intervention are present at this time. The patient/guardian have been advised of the diagnosis and plan. We have discussed signs and symptoms that warrant return to the ED, such as changes or worsening in symptoms.  Vital signs are stable at discharge. Filed Vitals:   09/10/13 1300  BP:   Pulse: 71  Temp:   Resp:     Patient/guardian has voiced understanding and agreed to follow-up with the PCP or specialist.     Dorthula Matas, PA-C 09/10/13 1427

## 2013-09-10 NOTE — ED Notes (Signed)
Attempted to draw blood, unsuccessful 

## 2013-09-10 NOTE — ED Notes (Signed)
Called phlebotomy to get pt's blood work, reports she will come as soon as she can.

## 2013-09-10 NOTE — ED Notes (Signed)
[name], NT attempted to draw blood, unsuccessful.

## 2013-09-10 NOTE — Discharge Instructions (Signed)
Chest Wall Pain Chest wall pain is pain in or around the bones and muscles of your chest. It may take up to 6 weeks to get better. It may take longer if you must stay physically active in your work and activities.  CAUSES  Chest wall pain may happen on its own. However, it may be caused by:  A viral illness like the flu.  Injury.  Coughing.  Exercise.  Arthritis.  Fibromyalgia.  Shingles. HOME CARE INSTRUCTIONS   Avoid overtiring physical activity. Try not to strain or perform activities that cause pain. This includes any activities using your chest or your abdominal and side muscles, especially if heavy weights are used.  Put ice on the sore area.  Put ice in a plastic bag.  Place a towel between your skin and the bag.  Leave the ice on for 15-20 minutes per hour while awake for the first 2 days.  Only take over-the-counter or prescription medicines for pain, discomfort, or fever as directed by your caregiver. SEEK IMMEDIATE MEDICAL CARE IF:   Your pain increases, or you are very uncomfortable.  You have a fever.  Your chest pain becomes worse.  You have new, unexplained symptoms.  You have nausea or vomiting.  You feel sweaty or lightheaded.  You have a cough with phlegm (sputum), or you cough up blood. MAKE SURE YOU:   Understand these instructions.  Will watch your condition.  Will get help right away if you are not doing well or get worse. Document Released: 06/29/2005 Document Revised: 09/21/2011 Document Reviewed: 02/23/2011 [facility] Patient Information 2014 [place], [place].  Arterial Hypertension Arterial hypertension (high blood pressure) is a condition of elevated pressure in your blood vessels. Hypertension over a long period of time is a risk factor for strokes, heart attacks, and heart failure. It is also the leading cause of kidney (renal) failure.  CAUSES   In Adults -- Over 90% of all hypertension has no known cause. This is called essential  or primary hypertension. In the other 10% of people with hypertension, the increase in blood pressure is caused by another disorder. This is called secondary hypertension. Important causes of secondary hypertension are:  Heavy alcohol use.  Obstructive sleep apnea.  Hyperaldosterosim (Conn's syndrome).  Steroid use.  Chronic kidney failure.  Hyperparathyroidism.  Medications.  Renal artery stenosis.  Pheochromocytoma.  Cushing's disease.  Coarctation of the aorta.  Scleroderma renal crisis.  Licorice (in excessive amounts).  Drugs (cocaine, methamphetamine). Your caregiver can explain any items above that apply to you.  In Children -- Secondary hypertension is more common and should always be considered.  Pregnancy -- Few women of childbearing age have high blood pressure. However, up to 10% of them develop hypertension of pregnancy. Generally, this will not harm the woman. It may be a sign of 3 complications of pregnancy: preeclampsia, HELLP syndrome, and eclampsia. Follow up and control with medication is necessary. SYMPTOMS   This condition normally does not produce any noticeable symptoms. It is usually found during a routine exam.  Malignant hypertension is a late problem of high blood pressure. It may have the following symptoms:  Headaches.  Blurred vision.  End-organ damage (this means your kidneys, heart, lungs, and other organs are being damaged).  Stressful situations can increase the blood pressure. If a person with normal blood pressure has their blood pressure go up while being seen by their caregiver, this is often termed "white coat hypertension." Its importance is not known. It may be  related with eventually developing hypertension or complications of hypertension.  Hypertension is often confused with mental tension, stress, and anxiety. DIAGNOSIS  The diagnosis is made by 3 separate blood pressure measurements. They are taken at least 1 week apart  from each other. If there is organ damage from hypertension, the diagnosis may be made without repeat measurements. Hypertension is usually identified by having blood pressure readings:  Above 140/90 mmHg measured in both arms, at 3 separate times, over a couple weeks.  Over 130/80 mmHg should be considered a risk factor and may require treatment in patients with diabetes. Blood pressure readings over 120/80 mmHg are called "pre-hypertension" even in non-diabetic patients. To get a true blood pressure measurement, use the following guidelines. Be aware of the factors that can alter blood pressure readings.  Take measurements at least 1 hour after caffeine.  Take measurements 30 minutes after smoking and without any stress. This is another reason to quit smoking  it raises your blood pressure.  Use a proper cuff size. Ask your caregiver if you are not sure about your cuff size.  Most home blood pressure cuffs are automatic. They will measure systolic and diastolic pressures. The systolic pressure is the pressure reading at the start of sounds. Diastolic pressure is the pressure at which the sounds disappear. If you are elderly, measure pressures in multiple postures. Try sitting, lying or standing.  Sit at rest for a minimum of 5 minutes before taking measurements.  You should not be on any medications like decongestants. These are found in many cold medications.  Record your blood pressure readings and review them with your caregiver. If you have hypertension:  Your caregiver may do tests to be sure you do not have secondary hypertension (see "causes" above).  Your caregiver may also look for signs of metabolic syndrome. This is also called Syndrome X or Insulin Resistance Syndrome. You may have this syndrome if you have type 2 diabetes, abdominal obesity, and abnormal blood lipids in addition to hypertension.  Your caregiver will take your medical and family history and perform a  physical exam.  Diagnostic tests may include blood tests (for glucose, cholesterol, potassium, and kidney function), a urinalysis, or an EKG. Other tests may also be necessary depending on your condition. PREVENTION  There are important lifestyle issues that you can adopt to reduce your chance of developing hypertension:  Maintain a normal weight.  Limit the amount of salt (sodium) in your diet.  Exercise often.  Limit alcohol intake.  Get enough potassium in your diet. Discuss specific advice with your caregiver.  Follow a DASH diet (dietary approaches to stop hypertension). This diet is rich in fruits, vegetables, and low-fat dairy products, and avoids certain fats. PROGNOSIS  Essential hypertension cannot be cured. Lifestyle changes and medical treatment can lower blood pressure and reduce complications. The prognosis of secondary hypertension depends on the underlying cause. Many people whose hypertension is controlled with medicine or lifestyle changes can live a normal, healthy life.  RISKS AND COMPLICATIONS  While high blood pressure alone is not an illness, it often requires treatment due to its short- and long-term effects on many organs. Hypertension increases your risk for:  CVAs or strokes (cerebrovascular accident).  Heart failure due to chronically high blood pressure (hypertensive cardiomyopathy).  Heart attack (myocardial infarction).  Damage to the retina (hypertensive retinopathy).  Kidney failure (hypertensive nephropathy). Your caregiver can explain list items above that apply to you. Treatment of hypertension can significantly reduce the risk  of complications. TREATMENT   For overweight patients, weight loss and regular exercise are recommended. Physical fitness lowers blood pressure.  Mild hypertension is usually treated with diet and exercise. A diet rich in fruits and vegetables, fat-free dairy products, and foods low in fat and salt (sodium) can help lower  blood pressure. Decreasing salt intake decreases blood pressure in a 1/3 of people.  Stop smoking if you are a smoker. The steps above are highly effective in reducing blood pressure. While these actions are easy to suggest, they are difficult to achieve. Most patients with moderate or severe hypertension end up requiring medications to bring their blood pressure down to a normal level. There are several classes of medications for treatment. Blood pressure pills (antihypertensives) will lower blood pressure by their different actions. Lowering the blood pressure by 10 mmHg may decrease the risk of complications by as much as 25%. The goal of treatment is effective blood pressure control. This will reduce your risk for complications. Your caregiver will help you determine the best treatment for you according to your lifestyle. What is excellent treatment for one person, may not be for you. HOME CARE INSTRUCTIONS   Do not smoke.  Follow the lifestyle changes outlined in the "Prevention" section.  If you are on medications, follow the directions carefully. Blood pressure medications must be taken as prescribed. Skipping doses reduces their benefit. It also puts you at risk for problems.  Follow up with your caregiver, as directed.  If you are asked to monitor your blood pressure at home, follow the guidelines in the "Diagnosis" section above. SEEK MEDICAL CARE IF:   You think you are having medication side effects.  You have recurrent headaches or lightheadedness.  You have swelling in your ankles.  You have trouble with your vision. SEEK IMMEDIATE MEDICAL CARE IF:   You have sudden onset of chest pain or pressure, difficulty breathing, or other symptoms of a heart attack.  You have a severe headache.  You have symptoms of a stroke (such as sudden weakness, difficulty speaking, difficulty walking). MAKE SURE YOU:   Understand these instructions.  Will watch your condition.  Will  get help right away if you are not doing well or get worse. Document Released: 06/29/2005 Document Revised: 09/21/2011 Document Reviewed: 01/27/2007 [facility] Patient Information 2014 [place]ExitCare, [place]LLC.

## 2013-09-12 NOTE — ED Provider Notes (Signed)
Medical screening examination/treatment/procedure(s) were performed by non-physician practitioner and as supervising physician I was immediately available for consultation/collaboration.  Delson Dulworth L Agostino Gorin, MD 09/12/13 2033 

## 2013-11-07 ENCOUNTER — Emergency Department (HOSPITAL_COMMUNITY): Payer: Self-pay

## 2013-11-07 ENCOUNTER — Encounter (HOSPITAL_COMMUNITY): Payer: Self-pay | Admitting: Emergency Medicine

## 2013-11-07 ENCOUNTER — Emergency Department (HOSPITAL_COMMUNITY)
Admission: EM | Admit: 2013-11-07 | Discharge: 2013-11-07 | Disposition: A | Discharge status: Home / Self Care | Payer: Self-pay | Attending: Emergency Medicine | Admitting: Emergency Medicine

## 2013-11-07 DIAGNOSIS — Z79899 Other long term (current) drug therapy: Secondary | ICD-10-CM | POA: Insufficient documentation

## 2013-11-07 DIAGNOSIS — J40 Bronchitis, not specified as acute or chronic: Secondary | ICD-10-CM

## 2013-11-07 DIAGNOSIS — Z872 Personal history of diseases of the skin and subcutaneous tissue: Secondary | ICD-10-CM | POA: Insufficient documentation

## 2013-11-07 DIAGNOSIS — E669 Obesity, unspecified: Secondary | ICD-10-CM | POA: Insufficient documentation

## 2013-11-07 DIAGNOSIS — J209 Acute bronchitis, unspecified: Secondary | ICD-10-CM | POA: Insufficient documentation

## 2013-11-07 DIAGNOSIS — I10 Essential (primary) hypertension: Secondary | ICD-10-CM | POA: Insufficient documentation

## 2013-11-07 LAB — CBC WITH DIFFERENTIAL/PLATELET
BASOS ABS: 0 10*3/uL (ref 0.0–0.1)
BASOS PCT: 0 % (ref 0–1)
EOS ABS: 0.2 10*3/uL (ref 0.0–0.7)
EOS PCT: 4 % (ref 0–5)
HEMATOCRIT: 40.9 % (ref 39.0–52.0)
Hemoglobin: 14.1 g/dL (ref 13.0–17.0)
Lymphocytes Relative: 36 % (ref 12–46)
Lymphs Abs: 2.1 10*3/uL (ref 0.7–4.0)
MCH: 30.6 pg (ref 26.0–34.0)
MCHC: 34.5 g/dL (ref 30.0–36.0)
MCV: 88.7 fL (ref 78.0–100.0)
MONO ABS: 0.5 10*3/uL (ref 0.1–1.0)
MONOS PCT: 8 % (ref 3–12)
NEUTROS ABS: 3 10*3/uL (ref 1.7–7.7)
Neutrophils Relative %: 52 % (ref 43–77)
Platelets: 147 10*3/uL — ABNORMAL LOW (ref 150–400)
RBC: 4.61 MIL/uL (ref 4.22–5.81)
RDW: 12.9 % (ref 11.5–15.5)
WBC: 5.8 10*3/uL (ref 4.0–10.5)

## 2013-11-07 LAB — BASIC METABOLIC PANEL
BUN: 8 mg/dL (ref 6–23)
CHLORIDE: 103 meq/L (ref 96–112)
CO2: 26 mEq/L (ref 19–32)
Calcium: 9.4 mg/dL (ref 8.4–10.5)
Creatinine, Ser: 0.65 mg/dL (ref 0.50–1.35)
GFR calc Af Amer: >90 mL/min (ref >90)
GFR calc non Af Amer: >90 mL/min (ref >90)
GLUCOSE: 104 mg/dL — AB (ref 70–99)
Potassium: 4.2 mEq/L (ref 3.7–5.3)
Sodium: 139 mEq/L (ref 137–147)

## 2013-11-07 LAB — PRO B NATRIURETIC PEPTIDE: PRO B NATRI PEPTIDE: 74.4 pg/mL (ref 0–125)

## 2013-11-07 LAB — TROPONIN I: Troponin I: <0.3 ng/mL (ref <0.30)

## 2013-11-07 MED ORDER — SODIUM CHLORIDE 0.9 % IJ SOLN
INTRAMUSCULAR | Status: AC
  Administered 2013-11-07: 3 mL
  Filled 2013-11-07: qty 3

## 2013-11-07 MED ORDER — ALBUTEROL SULFATE HFA 108 (90 BASE) MCG/ACT IN AERS
2.0000 | INHALATION_SPRAY | Freq: Once | RESPIRATORY_TRACT | Status: AC
  Administered 2013-11-07: 2 via RESPIRATORY_TRACT
  Filled 2013-11-07: qty 6.7

## 2013-11-07 MED ORDER — AZITHROMYCIN 250 MG PO TABS: ORAL_TABLET | ORAL | Status: DC

## 2013-11-07 MED ORDER — PREDNISONE 20 MG PO TABS: 40.0000 mg | ORAL_TABLET | Freq: Every day | ORAL | Status: DC

## 2013-11-07 MED ORDER — IPRATROPIUM-ALBUTEROL 0.5-2.5 (3) MG/3ML IN SOLN
3.0000 mL | Freq: Once | RESPIRATORY_TRACT | Status: AC
  Administered 2013-11-07: 3 mL via RESPIRATORY_TRACT
  Filled 2013-11-07: qty 3

## 2013-11-07 MED ORDER — PREDNISONE 20 MG PO TABS
40.0000 mg | ORAL_TABLET | Freq: Once | ORAL | Status: AC
  Administered 2013-11-07: 40 mg via ORAL
  Filled 2013-11-07: qty 2

## 2013-11-07 MED ORDER — AZITHROMYCIN 250 MG PO TABS
500.0000 mg | ORAL_TABLET | Freq: Once | ORAL | Status: AC
  Administered 2013-11-07: 500 mg via ORAL
  Filled 2013-11-07: qty 2

## 2013-11-07 NOTE — Discharge Instructions (Signed)
Rest and push fluids.   For fever and pain control you can take Motrin (ibuprofen) as follows: 400 mg (this is normally 2 over the counter pills) every 4 hours with food.   Return to the emergency room for any worsening or concerning symptoms including fast breathing, heart racing, confusion.    Bronchitis Bronchitis is inflammation of the airways that extend from the windpipe into the lungs (bronchi). The inflammation often causes mucus to develop, which leads to a cough. If the inflammation becomes severe, it may cause shortness of breath. CAUSES  Bronchitis may be caused by:   Viral infections.   Bacteria.   Cigarette smoke.   Allergens, pollutants, and other irritants.  SIGNS AND SYMPTOMS  The most common symptom of bronchitis is a frequent cough that produces mucus. Other symptoms include:  Fever.   Body aches.   Chest congestion.   Chills.   Shortness of breath.   Sore throat.  DIAGNOSIS  Bronchitis is usually diagnosed through a medical history and physical exam. Tests, such as chest X-rays, are sometimes done to rule out other conditions.  TREATMENT  You may need to avoid contact with whatever caused the problem (smoking, for example). Medicines are sometimes needed. These may include:  Antibiotics. These may be prescribed if the condition is caused by bacteria.  Cough suppressants. These may be prescribed for relief of cough symptoms.   Inhaled medicines. These may be prescribed to help open your airways and make it easier for you to breathe.   Steroid medicines. These may be prescribed for those with recurrent (chronic) bronchitis. HOME CARE INSTRUCTIONS  Get plenty of rest.   Drink enough fluids to keep your urine clear or pale yellow (unless you have a medical condition that requires fluid restriction). Increasing fluids may help thin your secretions and will prevent dehydration.   Only take over-the-counter or prescription medicines as  directed by your health care provider.  Only take antibiotics as directed. Make sure you finish them even if you start to feel better.  Avoid secondhand smoke, irritating chemicals, and strong fumes. These will make bronchitis worse. If you are a smoker, quit smoking. Consider using nicotine gum or skin patches to help control withdrawal symptoms. Quitting smoking will help your lungs heal faster.   Put a cool-mist humidifier in your bedroom at night to moisten the air. This may help loosen mucus. Change the water in the humidifier daily. You can also run the hot water in your shower and sit in the bathroom with the door closed for 5 10 minutes.   Follow up with your health care provider as directed.   Wash your hands frequently to avoid catching bronchitis again or spreading an infection to others.  SEEK MEDICAL CARE IF: Your symptoms do not improve after 1 week of treatment.  SEEK IMMEDIATE MEDICAL CARE IF:  Your fever increases.  You have chills.   You have chest pain.   You have worsening shortness of breath.   You have bloody sputum.  You faint.  You have lightheadedness.  You have a severe headache.   You vomit repeatedly. MAKE SURE YOU:   Understand these instructions.  Will watch your condition.  Will get help right away if you are not doing well or get worse. Document Released: 06/29/2005 Document Revised: 04/19/2013 Document Reviewed: 02/21/2013 [facility] Patient Information 2014 [place] VillageExitCare, [place]LL[place].

## 2013-11-07 NOTE — ED Provider Notes (Signed)
CSN: [id number]     Arrival date & time 11/07/13  1723 History   First MD Initiated Contact with Patient 11/07/13 1941     Chief Complaint  Patient presents with  . Shortness of Breath     (Consider location/radiation/quality/duration/timing/severity/associated sxs/prior Treatment) HPI  [name] Needs is a 53 y.o. male complaining of productive cough, shortness of breath and wheezing worsening over the course of 3 days. Pt denies fever, cough, h/o DVT/PE, calf pain or leg swelling, hemoptysis, recent immobilization, cancer/chemotherapy in the last 6 months.    Past Medical History  Diagnosis Date  . Hypertension   . Skin infection   . Obesity   . Bronchitis    History reviewed. No pertinent past surgical history. History reviewed. No pertinent family history. History  Substance Use Topics  . Smoking status: Never Smoker   . Smokeless tobacco: Not on file  . Alcohol Use: No    Review of Systems  10 systems reviewed and found to be negative, except as noted in the HPI.   Allergies  Oxycodone  Home Medications   Prior to Admission medications   Medication Sig Start Date End Date Taking? Authorizing Provider  lisinopril (PRINIVIL,ZESTRIL) 20 MG tablet Take 1 tablet (20 mg total) by mouth daily. 09/10/13  Yes Tiffany Irine SealG Greene, PA-C  naproxen sodium (ANAPROX) 220 MG tablet Take 660 mg by mouth daily as needed (knee pain).   Yes Historical Provider, MD   BP 129/72  Pulse 74  Temp(Src) 98.2 F (36.8 C) (Oral)  Resp 16  Ht 5\' 10"  (1.778 m)  SpO2 98% Physical Exam  Nursing note and vitals reviewed. Constitutional: He is oriented to person, place, and time. He appears well-developed and well-nourished. No distress.  HENT:  Head: Normocephalic and atraumatic.  Mouth/Throat: Oropharynx is clear and moist.  Eyes: Conjunctivae and EOM are normal. Pupils are equal, round, and reactive to light.  Neck: Normal range of motion.  Cardiovascular: Normal rate.   Pulmonary/Chest:  Effort normal. No stridor. No respiratory distress. He has wheezes. He has no rales. He exhibits no tenderness.  No tripoding, no stridor, patient speaking in complete sentences.  Mild diffuse expiratory wheezing with no rales or rhonchi.  Good air movement in all fields.  Abdominal: Soft. Bowel sounds are normal. He exhibits no distension and no mass. There is no tenderness. There is no rebound and no guarding.  Musculoskeletal: Normal range of motion.  Neurological: He is alert and oriented to person, place, and time.  Psychiatric: He has a normal mood and affect.    ED Course  Procedures (including critical care time) Labs Review Labs Reviewed  CBC WITH DIFFERENTIAL - Abnormal; Notable for the following:    Platelets 147 (*)    All other components within normal limits  BASIC METABOLIC PANEL - Abnormal; Notable for the following:    Glucose, Bld 104 (*)    All other components within normal limits  TROPONIN I  PRO B NATRIURETIC PEPTIDE    Imaging Review No results found.   EKG Interpretation   Date/Time:  Tuesday November 07 2013 17:28:08 EDT Ventricular Rate:  90 PR Interval:  140 QRS Duration: 86 QT Interval:  352 QTC Calculation: 430 R Axis:   30 Text Interpretation:  Normal sinus rhythm Moderate voltage criteria for  LVH, may be normal variant T wave abnormality, consider inferior ischemia  Abnormal ECG ED PHYSICIAN INTERPRETATION AVAILABLE IN [facility]  Confirmed by TEST, Record ([id number]) on 11/09/2013 7:30:47 AM  MDM   Final diagnoses:  Bronchitis    Filed Vitals:   11/07/13 2100 11/07/13 2130 11/07/13 2145 11/07/13 2200  BP: 141/64 127/65 125/94 129/72  Pulse: 66 74 85 74  Temp:      TempSrc:      Resp:  13 21 16   Height:      SpO2: 98% 100% 100% 98%    Medications  predniSONE (DELTASONE) tablet 40 mg (40 mg Oral Given 11/07/13 2113)  ipratropium-albuterol (DUONEB) 0.5-2.5 (3) MG/3ML nebulizer solution 3 mL (3 mLs Nebulization Given  11/07/13 2113)  azithromycin (ZITHROMAX) tablet 500 mg (500 mg Oral Given 11/07/13 2145)  sodium chloride 0.9 % injection (3 mLs  Given 11/07/13 2113)  albuterol (PROVENTIL HFA;VENTOLIN HFA) 108 (90 BASE) MCG/ACT inhaler 2 puff (2 puffs Inhalation Given 11/07/13 2210)    [name] [name] is a 53 y.o. male presenting with productive cough, shortness of breath and wheezing. Patient is nonasthmatic. Chest x-ray with no signs of infiltrate. EKG is nonischemic and troponin and BNP are also negative. Patient improved significantly after nebulizer treatment. We'll treat as acute bronchitis with reactive airway.  Evaluation does not show pathology that would require ongoing emergent intervention or inpatient treatment. Pt is hemodynamically stable and mentating appropriately. Discussed findings and plan with patient/guardian, who agrees with care plan. All questions answered. Return precautions discussed and outpatient follow up given.   Discharge Medication List as of 11/07/2013 10:01 PM    START taking these medications   Details  azithromycin (ZITHROMAX Z-PAK) 250 MG tablet 2 po day one, then 1 daily x 4 days, Print    predniSONE (DELTASONE) 20 MG tablet Take 2 tablets (40 mg total) by mouth daily., Starting 11/07/2013, Until Discontinued, Print        Note: Portions of this report may have been transcribed using voice recognition software. Every effort was made to ensure accuracy; however, inadvertent computerized transcription errors may be present    Christopher Emeryicole Collyns Mcquigg, PA-C 11/09/13 1915  Christopher Emeryicole Tudor Chandley, PA-C 11/09/13 [id number]

## 2013-11-07 NOTE — ED Notes (Signed)
PA Nicole at the bedside  

## 2013-11-07 NOTE — ED Notes (Signed)
Reports having sob and wheezing for several days, denies any swelling to extremities, denies hx of asthma. Reports recent cough with brown sputum.

## 2013-11-07 NOTE — ED Provider Notes (Signed)
Medical screening examination/treatment/procedure(s) were conducted as a shared visit with non-physician practitioner(s) and myself.  I personally evaluated the patient during the encounter.   EKG Interpretation None      Date: 11/07/2013  Rate: 90  Rhythm: normal sinus rhythm  QRS Axis: normal  Intervals: normal  ST/T Wave abnormalities: nonspecific T wave changes  Conduction Disutrbances:none  Narrative Interpretation:   Old EKG Reviewed: none available    Toy BakerAnthony T Dasie Chancellor, MD 11/18/13 1515

## 2013-11-09 NOTE — ED Provider Notes (Signed)
Medical screening examination/treatment/procedure(s) were performed by non-physician practitioner and as supervising physician I was immediately available for consultation/collaboration.  Jakaila Norment T Retha Bither, MD 11/09/13 2325 

## 2013-11-24 ENCOUNTER — Emergency Department (HOSPITAL_COMMUNITY)
Admission: EM | Admit: 2013-11-24 | Discharge: 2013-11-24 | Disposition: A | Discharge status: Home / Self Care | Payer: Self-pay | Attending: Emergency Medicine | Admitting: Emergency Medicine

## 2013-11-24 ENCOUNTER — Emergency Department (HOSPITAL_COMMUNITY): Payer: Self-pay

## 2013-11-24 ENCOUNTER — Encounter (HOSPITAL_COMMUNITY): Payer: Self-pay | Admitting: Emergency Medicine

## 2013-11-24 DIAGNOSIS — E669 Obesity, unspecified: Secondary | ICD-10-CM | POA: Insufficient documentation

## 2013-11-24 DIAGNOSIS — I10 Essential (primary) hypertension: Secondary | ICD-10-CM | POA: Insufficient documentation

## 2013-11-24 DIAGNOSIS — R0781 Pleurodynia: Secondary | ICD-10-CM

## 2013-11-24 DIAGNOSIS — W1809XA Striking against other object with subsequent fall, initial encounter: Secondary | ICD-10-CM | POA: Insufficient documentation

## 2013-11-24 DIAGNOSIS — Z8709 Personal history of other diseases of the respiratory system: Secondary | ICD-10-CM | POA: Insufficient documentation

## 2013-11-24 DIAGNOSIS — W19XXXA Unspecified fall, initial encounter: Secondary | ICD-10-CM

## 2013-11-24 DIAGNOSIS — W010XXA Fall on same level from slipping, tripping and stumbling without subsequent striking against object, initial encounter: Secondary | ICD-10-CM | POA: Insufficient documentation

## 2013-11-24 DIAGNOSIS — Y9389 Activity, other specified: Secondary | ICD-10-CM | POA: Insufficient documentation

## 2013-11-24 DIAGNOSIS — S298XXA Other specified injuries of thorax, initial encounter: Secondary | ICD-10-CM | POA: Insufficient documentation

## 2013-11-24 DIAGNOSIS — Y9289 Other specified places as the place of occurrence of the external cause: Secondary | ICD-10-CM | POA: Insufficient documentation

## 2013-11-24 MED ORDER — IPRATROPIUM BROMIDE 0.02 % IN SOLN
0.5000 mg | Freq: Once | RESPIRATORY_TRACT | Status: AC
  Administered 2013-11-24: 0.5 mg via RESPIRATORY_TRACT
  Filled 2013-11-24: qty 2.5

## 2013-11-24 MED ORDER — ALBUTEROL SULFATE (2.5 MG/3ML) 0.083% IN NEBU
5.0000 mg | INHALATION_SOLUTION | Freq: Once | RESPIRATORY_TRACT | Status: AC
  Administered 2013-11-24: 5 mg via RESPIRATORY_TRACT
  Filled 2013-11-24: qty 6

## 2013-11-24 MED ORDER — HYDROCODONE-ACETAMINOPHEN 5-325 MG PO TABS
2.0000 | ORAL_TABLET | Freq: Once | ORAL | Status: AC
  Administered 2013-11-24: 2 via ORAL
  Filled 2013-11-24: qty 2

## 2013-11-24 MED ORDER — HYDROCODONE-ACETAMINOPHEN 5-325 MG PO TABS: 1.0000 | ORAL_TABLET | Freq: Four times a day (QID) | ORAL | Status: DC | PRN

## 2013-11-24 NOTE — Discharge Instructions (Signed)
Rib Contusion A rib contusion (bruise) can occur by a blow to the chest or by a fall against a hard object. Usually these will be much better in a couple weeks. If X-rays were taken today and there are no broken bones (fractures), the diagnosis of bruising is made. However, broken ribs may not show up for several days, or may be discovered later on a routine X-ray when signs of healing show up. If this happens to you, it does not mean that something was missed on the X-ray, but simply that it did not show up on the first X-rays. Earlier diagnosis will not usually change the treatment. HOME CARE INSTRUCTIONS   Avoid strenuous activity. Be careful during activities and avoid bumping the injured ribs. Activities that pull on the injured ribs and cause pain should be avoided, if possible.  For the first day or two, an ice pack used every 20 minutes while awake may be helpful. Put ice in a plastic bag and put a towel between the bag and the skin.  Eat a normal, well-balanced diet. Drink plenty of fluids to avoid constipation.  Take deep breaths several times a day to keep lungs free of infection. Try to cough several times a day. Splint the injured area with a pillow while coughing to ease pain. Coughing can help prevent pneumonia.  Wear a rib belt or binder only if told to do so by your caregiver. If you are wearing a rib belt or binder, you must do the breathing exercises as directed by your caregiver. If not used properly, rib belts or binders restrict breathing which can lead to pneumonia.  Only take over-the-counter or prescription medicines for pain, discomfort, or fever as directed by your caregiver. SEEK MEDICAL CARE IF:   You or your child has an oral temperature above 102 F (38.9 C).  Your baby is older than 3 months with a rectal temperature of 100.5 F (38.1 C) or higher for more than 1 day.  You develop a cough, with thick or bloody sputum. SEEK IMMEDIATE MEDICAL CARE IF:   You  have difficulty breathing.  You feel sick to your stomach (nausea), have vomiting or belly (abdominal) pain.  You have worsening pain, not controlled with medications, or there is a change in the location of the pain.  You develop sweating or radiation of the pain into the arms, jaw or shoulders, or become light headed or faint.  You or your child has an oral temperature above 102 F (38.9 C), not controlled by medicine.  Your or your baby is older than 3 months with a rectal temperature of 102 F (38.9 C) or higher.  Your baby is 3 months old or younger with a rectal temperature of 100.4 F (38 C) or higher. MAKE SURE YOU:   Understand these instructions.  Will watch your condition.  Will get help right away if you are not doing well or get worse. Document Released: 03/24/2001 Document Revised: 10/24/2012 Document Reviewed: 02/15/2008 Exit[employer] Patient Information 2014 [employer].  

## 2013-11-24 NOTE — ED Provider Notes (Signed)
CSN: [id number]     Arrival date & time 11/24/13  1808 History   First MD Initiated Contact with Patient 11/24/13 1835     Chief Complaint  Patient presents with  . Fall     (Consider location/radiation/quality/duration/timing/severity/associated sxs/prior Treatment) HPI Comments: Patient presents emergency department with chief complaint of chest pain. He states that he tripped and fell yesterday, and his chest on the corner of a box. He states that yesterday he did not have any pain, but when he awoke this morning, he has had mild/moderate pain. Throughout the day, the pain has worsened to severe pain. He states he has tried taking Robaxin with no relief. He reports increased pain with inspiration, coughing, and bending.  The history is provided by the patient. No language interpreter was used.    Past Medical History  Diagnosis Date  . Hypertension   . Skin infection   . Obesity   . Bronchitis    History reviewed. No pertinent past surgical history. No family history on file. History  Substance Use Topics  . Smoking status: Never Smoker   . Smokeless tobacco: Not on file  . Alcohol Use: No    Review of Systems  Constitutional: Negative for fever and chills.  Respiratory: Negative for shortness of breath.   Cardiovascular: Positive for chest pain.  Gastrointestinal: Negative for nausea, vomiting, diarrhea and constipation.  Genitourinary: Negative for dysuria.      Allergies  Oxycodone  Home Medications   Prior to Admission medications   Medication Sig Start Date End Date Taking? Authorizing Provider  lisinopril (PRINIVIL,ZESTRIL) 20 MG tablet Take 1 tablet (20 mg total) by mouth daily. 09/10/13  Yes Tiffany Irine SealG Greene, PA-C  methocarbamol (ROBAXIN) 500 MG tablet Take 500 mg by mouth every 6 (six) hours as needed for muscle spasms.   Yes Historical Provider, MD   BP 155/83  Pulse 89  Temp(Src) 98 F (36.7 C) (Oral)  Resp 18  SpO2 98% Physical Exam  Nursing note  and vitals reviewed. Constitutional: He is oriented to person, place, and time. He appears well-developed and well-nourished.  HENT:  Head: Normocephalic and atraumatic.  Eyes: Conjunctivae and EOM are normal. Pupils are equal, round, and reactive to light. Right eye exhibits no discharge. Left eye exhibits no discharge. No scleral icterus.  Neck: Normal range of motion. Neck supple. No JVD present.  Cardiovascular: Normal rate, regular rhythm and normal heart sounds.  Exam reveals no gallop and no friction rub.   No murmur heard. Pulmonary/Chest: Effort normal. No respiratory distress. He has no wheezes. He has rales. He exhibits no tenderness.  Right-sided rale  Chest tender to palpation, no bruising  Abdominal: Soft. He exhibits no distension and no mass. There is no tenderness. There is no rebound and no guarding.  Musculoskeletal: Normal range of motion. He exhibits no edema and no tenderness.  Neurological: He is alert and oriented to person, place, and time.  Skin: Skin is warm and dry.  Psychiatric: He has a normal mood and affect. His behavior is normal. Judgment and thought content normal.    ED Course  Procedures (including critical care time) Labs Review Labs Reviewed - No data to display  Imaging Review Dg Chest 2 View  11/24/2013   CLINICAL DATA:  Sharp right chest pain for a few hours  EXAM: CHEST  2 VIEW  COMPARISON:  November 07, 2013, September 10, 2013,  FINDINGS: The heart size and mediastinal contours are within normal limits. Confluence of  shadows is identified in the medial right lung base unchanged. There is no focal infiltrate, pulmonary edema, or pleural effusion. The visualized skeletal structures are stable.  IMPRESSION: No active cardiopulmonary disease.   Electronically Signed   By: Sherian ReinWei-Chen  Lin M.D.   On: 11/24/2013 19:55     EKG Interpretation None      MDM   Final diagnoses:  Rib pain  Fall    Patient with chest pain following a fall yesterday. Will  check chest x-ray. Will treat pain.  Plan films are negative. Under exam, no rales are heard. Chest pain is reproducible with palpation, and is mostly located over the right inferior ribs. Discharge with pain medicine, and instructions regarding rib injury. Patient is stable and ready for discharge.   Roxy Horsemanobert Beyonka Pitney, PA-C 11/24/13 2044

## 2013-11-24 NOTE — ED Notes (Signed)
The pt is c/o chest pain after a fall yesterday.  He tripped and fell flat onto his chest.  No pain then today he is in severe pain

## 2013-11-25 NOTE — ED Provider Notes (Signed)
Medical screening examination/treatment/procedure(s) were performed by non-physician practitioner and as supervising physician I was immediately available for consultation/collaboration.  Flint MelterElliott L Bryley Chrisman, MD 11/25/13 [phone]

## 2014-04-06 ENCOUNTER — Emergency Department (HOSPITAL_COMMUNITY)
Admission: EM | Admit: 2014-04-06 | Discharge: 2014-04-06 | Disposition: A | Discharge status: Home / Self Care | Payer: Self-pay | Attending: Emergency Medicine | Admitting: Emergency Medicine

## 2014-04-06 ENCOUNTER — Encounter (HOSPITAL_COMMUNITY): Payer: Self-pay | Admitting: Emergency Medicine

## 2014-04-06 ENCOUNTER — Emergency Department (HOSPITAL_COMMUNITY): Payer: Self-pay

## 2014-04-06 DIAGNOSIS — R0602 Shortness of breath: Secondary | ICD-10-CM | POA: Insufficient documentation

## 2014-04-06 DIAGNOSIS — Z79899 Other long term (current) drug therapy: Secondary | ICD-10-CM | POA: Insufficient documentation

## 2014-04-06 DIAGNOSIS — I10 Essential (primary) hypertension: Secondary | ICD-10-CM | POA: Insufficient documentation

## 2014-04-06 DIAGNOSIS — Z872 Personal history of diseases of the skin and subcutaneous tissue: Secondary | ICD-10-CM | POA: Insufficient documentation

## 2014-04-06 DIAGNOSIS — Z8709 Personal history of other diseases of the respiratory system: Secondary | ICD-10-CM | POA: Insufficient documentation

## 2014-04-06 DIAGNOSIS — D72829 Elevated white blood cell count, unspecified: Secondary | ICD-10-CM | POA: Insufficient documentation

## 2014-04-06 DIAGNOSIS — E669 Obesity, unspecified: Secondary | ICD-10-CM | POA: Insufficient documentation

## 2014-04-06 DIAGNOSIS — R000 Tachycardia, unspecified: Secondary | ICD-10-CM | POA: Insufficient documentation

## 2014-04-06 DIAGNOSIS — B349 Viral infection, unspecified: Secondary | ICD-10-CM

## 2014-04-06 DIAGNOSIS — B9789 Other viral agents as the cause of diseases classified elsewhere: Secondary | ICD-10-CM | POA: Insufficient documentation

## 2014-04-06 LAB — CBC WITH DIFFERENTIAL/PLATELET
Basophils Absolute: 0 10*3/uL (ref 0.0–0.1)
Basophils Relative: 0 % (ref 0–1)
Eosinophils Absolute: 0 10*3/uL (ref 0.0–0.7)
Eosinophils Relative: 0 % (ref 0–5)
HEMATOCRIT: 40.1 % (ref 39.0–52.0)
Hemoglobin: 13.7 g/dL (ref 13.0–17.0)
LYMPHS ABS: 0.7 10*3/uL (ref 0.7–4.0)
LYMPHS PCT: 4 % — AB (ref 12–46)
MCH: 30 pg (ref 26.0–34.0)
MCHC: 34.2 g/dL (ref 30.0–36.0)
MCV: 87.7 fL (ref 78.0–100.0)
MONO ABS: 1.1 10*3/uL — AB (ref 0.1–1.0)
Monocytes Relative: 6 % (ref 3–12)
Neutro Abs: 17.2 10*3/uL — ABNORMAL HIGH (ref 1.7–7.7)
Neutrophils Relative %: 90 % — ABNORMAL HIGH (ref 43–77)
PLATELETS: 124 10*3/uL — AB (ref 150–400)
RBC: 4.57 MIL/uL (ref 4.22–5.81)
RDW: 13.1 % (ref 11.5–15.5)
WBC: 19.1 10*3/uL — AB (ref 4.0–10.5)

## 2014-04-06 LAB — URINALYSIS, ROUTINE W REFLEX MICROSCOPIC
BILIRUBIN URINE: NEGATIVE
GLUCOSE, UA: NEGATIVE mg/dL
HGB URINE DIPSTICK: NEGATIVE
KETONES UR: NEGATIVE mg/dL
Leukocytes, UA: NEGATIVE
Nitrite: NEGATIVE
PH: 8 (ref 5.0–8.0)
PROTEIN: NEGATIVE mg/dL
Specific Gravity, Urine: 1.027 (ref 1.005–1.030)
Urobilinogen, UA: 1 mg/dL (ref 0.0–1.0)

## 2014-04-06 LAB — I-STAT CHEM 8, ED
BUN: 12 mg/dL (ref 6–23)
CALCIUM ION: 1.11 mmol/L — AB (ref 1.12–1.23)
CREATININE: 0.7 mg/dL (ref 0.50–1.35)
Chloride: 106 mEq/L (ref 96–112)
Glucose, Bld: 120 mg/dL — ABNORMAL HIGH (ref 70–99)
HCT: 45 % (ref 39.0–52.0)
HEMOGLOBIN: 15.3 g/dL (ref 13.0–17.0)
Potassium: 3.8 mEq/L (ref 3.7–5.3)
Sodium: 139 mEq/L (ref 137–147)
TCO2: 22 mmol/L (ref 0–100)

## 2014-04-06 LAB — I-STAT CG4 LACTIC ACID, ED: Lactic Acid, Venous: 1.04 mmol/L (ref 0.5–2.2)

## 2014-04-06 LAB — D-DIMER, QUANTITATIVE: D-Dimer, Quant: 0.75 ug/mL-FEU — ABNORMAL HIGH (ref 0.00–0.48)

## 2014-04-06 MED ORDER — IBUPROFEN 800 MG PO TABS: 800.0000 mg | ORAL_TABLET | Freq: Three times a day (TID) | ORAL | Status: DC | PRN

## 2014-04-06 MED ORDER — IOHEXOL 350 MG/ML SOLN
80.0000 mL | Freq: Once | INTRAVENOUS | Status: AC | PRN
  Administered 2014-04-06: 100 mL via INTRAVENOUS

## 2014-04-06 MED ORDER — LISINOPRIL 20 MG PO TABS: 20.0000 mg | ORAL_TABLET | Freq: Every day | ORAL | Status: DC

## 2014-04-06 MED ORDER — ACETAMINOPHEN 325 MG PO TABS
650.0000 mg | ORAL_TABLET | Freq: Four times a day (QID) | ORAL | Status: DC | PRN
  Administered 2014-04-06: 650 mg via ORAL
  Filled 2014-04-06: qty 2

## 2014-04-06 MED ORDER — MORPHINE SULFATE 4 MG/ML IJ SOLN
4.0000 mg | Freq: Once | INTRAMUSCULAR | Status: AC
  Administered 2014-04-06: 4 mg via INTRAVENOUS
  Filled 2014-04-06: qty 1

## 2014-04-06 MED ORDER — LISINOPRIL 10 MG PO TABS
10.0000 mg | ORAL_TABLET | Freq: Once | ORAL | Status: AC
Start: 2014-04-06 — End: 2014-04-06
  Administered 2014-04-06: 10 mg via ORAL
  Filled 2014-04-06: qty 1

## 2014-04-06 MED ORDER — ACETAMINOPHEN 500 MG PO TABS: 500.0000 mg | ORAL_TABLET | Freq: Four times a day (QID) | ORAL | Status: DC | PRN

## 2014-04-06 MED ORDER — SODIUM CHLORIDE 0.9 % IV BOLUS (SEPSIS)
1000.0000 mL | Freq: Once | INTRAVENOUS | Status: AC
  Administered 2014-04-06: 1000 mL via INTRAVENOUS

## 2014-04-06 NOTE — ED Notes (Addendum)
Pt reports waking up this am with sob, chills, headache. Denies cough. Airway intact at triage and ekg done. Reports not taking bp meds x 1 month.

## 2014-04-06 NOTE — ED Notes (Signed)
Patient reports that this last night he was cold, had chills, then he started getting hot, going back and forth throughtout the night until he went to sleep..  When he woke this am his head was hurting so bad.  He couldn't hardly breath last night.

## 2014-04-06 NOTE — Discharge Planning (Signed)
[facility] Community Liaison   Spoke to patient regarding primary care resources and establishing care with a provider. Follow up appointment made with the [facility] for Monday Sept 28,2015 at 12:00p, pt verbalized understanding of this appointment. Financial assistance appointment also made. [employer] card application and my contact information provided for any future questions or concerns. No other community liaison needs identified at this time.

## 2014-04-06 NOTE — ED Notes (Addendum)
Pt given urinal and asked to provide a urine sample.  

## 2014-04-06 NOTE — ED Notes (Signed)
Portable xray at bedside.

## 2014-04-06 NOTE — ED Notes (Signed)
Pt returned from radiology.

## 2014-04-06 NOTE — ED Provider Notes (Signed)
CSN: [id number]     Arrival date & time 04/06/14  0820 History   First MD Initiated Contact with Patient 04/06/14 [phone]     Chief Complaint  Patient presents with  . Shortness of Breath  . Headache     (Consider location/radiation/quality/duration/timing/severity/associated sxs/prior Treatment) HPI  53 year old obese male with history of recurrent bronchitis, hypertension presents for evaluation of shortness of breath. Patient reports since last night he has been having fever, chills, sharp throbbing forehead headache, body aches and increased shortness of breath. Report lightheadedness with positional change. Symptom is gradual but has been persistent. Last normal was yesterday morning. Headache is similar to prior migraine headache. No associated runny nose, sneezing, coughing, sore throat, chest pain, productive cough, hemoptysis, nausea vomiting, diarrhea, abdominal pain or back pain, or diaphoresis. No specific treatment tried. Patient admits that he has history of high blood pressure but has not refilled his blood pressure medication for a month. Denies any recent sick contact. Patient is a nonsmoker, no recent surgery, prolonged bed rest, unilateral leg swelling or calf pain, no prior history of PE DVT, no active cancer.  Past Medical History  Diagnosis Date  . Hypertension   . Skin infection   . Obesity   . Bronchitis    History reviewed. No pertinent past surgical history. History reviewed. No pertinent family history. History  Substance Use Topics  . Smoking status: Never Smoker   . Smokeless tobacco: Not on file  . Alcohol Use: No    Review of Systems  All other systems reviewed and are negative.     Allergies  Oxycodone  Home Medications   Prior to Admission medications   Medication Sig Start Date End Date Taking? Authorizing Provider  HYDROcodone-acetaminophen (NORCO/VICODIN) 5-325 MG per tablet Take 1-2 tablets by mouth every 6 (six) hours as needed. 11/24/13    Roxy Horseman, PA-C  lisinopril (PRINIVIL,ZESTRIL) 20 MG tablet Take 1 tablet (20 mg total) by mouth daily. 09/10/13   Tiffany Irine Seal, PA-C  methocarbamol (ROBAXIN) 500 MG tablet Take 500 mg by mouth every 6 (six) hours as needed for muscle spasms.    Historical Provider, MD   There were no vitals taken for this visit. Physical Exam  Constitutional: He appears well-developed and well-nourished. No distress.  HENT:  Head: Atraumatic.  Right Ear: External ear normal.  Left Ear: External ear normal.  Nose: Nose normal.  Mouth/Throat: Oropharynx is clear and moist. No oropharyngeal exudate.  Eyes: Conjunctivae are normal.  Neck: Normal range of motion. Neck supple. No JVD present.  No nuchal rigidity  Cardiovascular:  Tachycardia without murmur rubs or gallops  Pulmonary/Chest: Effort normal and breath sounds normal. No respiratory distress. He has no wheezes. He has no rales. He exhibits no tenderness.  Abdominal: Soft. There is no tenderness.  Musculoskeletal: He exhibits no edema.  Neurological: He is alert.  Skin: Skin is warm. No rash noted.  Psychiatric: He has a normal mood and affect.    ED Course  Procedures (including critical care time)  Patient presents complaining of headache, and shortness of breath. He is febrile with a temperature of 103. He is also tachycardic likely due to his fever. He denies any significant cough but endorsed shortness of breath. History of recurrent bronchitis. He does not have any significant risk factors for PE. He complains of a headache, has no meningismal sign on exam, I have low suspicion for meningitis.  We'll provide patient with IV fluid, Tylenol for fever, chest x-ray to  rule out pneumonia. His EKG shows tachycardia but no acute ischemic changes. He is hypertensive has not been taking his blood pressure medication one month. Patient agrees to resume his blood pressure medication and follow up closely with his doctor next week.  9:36  AM CXR unremarkable, but it's only 1 view, and may not be sensitive.  Since i cannot use PERC criteria to r/o PE, i will obtain d-dimer.  Will check basic labs.    12:12 PM  patient with leukocytosis, WBC is 19.1 with a left shift. D-dimer is mildly elevated at 0.75. A chest CTA without any obvious PE or pneumonia or CHF. The pulmonary arterial tree was not fully visualized.  However, given low suspicion for PE, i do not think VQ scan is indicated at this time.  Pt report his headache has resolved.  Fever resolved with antipyretic.  UA without signs of infection.  I discussed the finding with Dr. Wilkie Aye.    12:25 PM Dr Wilkie Aye has evaluated pt.  We both believe pt likely not having meningitis as he is feeling better and is without active headache or meningismal sign.  Suspect flu-like sxs causing his headache and fever. We discussed option of spinal tap but believe risk outweigh benefit at this time, pt agrees.  Strong return precaution discussed.  Social worker has talked to pt and set up outpt f/u next week on 9/28.    Labs Review Labs Reviewed  CBC WITH DIFFERENTIAL - Abnormal; Notable for the following:    WBC 19.1 (*)    Platelets 124 (*)    Neutrophils Relative % 90 (*)    Neutro Abs 17.2 (*)    Lymphocytes Relative 4 (*)    Monocytes Absolute 1.1 (*)    All other components within normal limits  D-DIMER, QUANTITATIVE - Abnormal; Notable for the following:    D-Dimer, Quant 0.75 (*)    All other components within normal limits  URINALYSIS, ROUTINE W REFLEX MICROSCOPIC - Abnormal; Notable for the following:    Color, Urine AMBER (*)    All other components within normal limits  I-STAT CHEM 8, ED - Abnormal; Notable for the following:    Glucose, Bld 120 (*)    Calcium, Ion 1.11 (*)    All other components within normal limits  I-STAT CG4 LACTIC ACID, ED    Imaging Review Ct Angio Chest Pe W/cm &/or Wo Cm  04/06/2014   CLINICAL DATA:  Shortness of breath, fever, chills, cough,  and chest pain with history of bronchitis and hypertension  EXAM: CT ANGIOGRAPHY CHEST WITH CONTRAST  TECHNIQUE: Multidetector CT imaging of the chest was performed using the standard protocol during bolus administration of intravenous contrast. Multiplanar CT image reconstructions and MIPs were obtained to evaluate the vascular anatomy.  CONTRAST:  OMNIPAQUE IOHEXOL 350 MG/ML SOLN intravenously the timing of the contrast bolus is suboptimal.  COMPARISON:  Portable chest x-ray of today's date  FINDINGS: The contrast bolus timing is suboptimal. This renders the study nondiagnostic for mid and peripheral pulmonary arterial tree evaluation. The proximal pulmonary arteries are grossly normal. Contrast within the thoracic aorta is normal with no evidence of a false lumen. There is no thoracic aortic aneurysm. The cardiac chambers are top-normal in size. There is no pericardial effusion. There is no bulky mediastinal or hilar lymphadenopathy. There is a small hiatal hernia.  At lung window settings there is no interstitial or alveolar infiltrate. There are no pulmonary parenchymal masses. There is no pleural effusion.  Within the upper abdomen the observed portions of the liver and spleen are unremarkable. The sternum, thoracic spine, and observed portions of the ribs exhibit no acute abnormalities.  Review of the MIP images confirms the above findings.  IMPRESSION: 1. The study is nondiagnostic in terms of evaluating the pulmonary arterial tree from its mid through distal portions. Proximally no filling defects are demonstrated. The thoracic aorta is normal. 2. There is no evidence of CHF nor pneumonia. There is no pleural nor pericardial effusion. 3. There is a small hiatal hernia. 4. Consideration could be given for repeating this study or for performing a ventilation-perfusion lung scan if acute pulmonary embolism remains strongly suspected.   Electronically Signed   By: David  [place]   On: 04/06/2014 11:49    Dg Chest Port 1 View  (if Code Sepsis Called)  04/06/2014   CLINICAL DATA:  Shortness of breath with fever and headache ; history of bronchitis and hypertension  EXAM: PORTABLE CHEST - 1 VIEW  COMPARISON:  PA and lateral chest x-ray of Nov 24, 2013  FINDINGS: The lungs are adequately inflated. There is no focal infiltrate. The pulmonary vascularity is mildly prominent centrally without definite cephalization. The cardiac silhouette is top-normal in size. There is no pleural effusion. The bony thorax is unremarkable.  IMPRESSION: There is no evidence of pneumonia. There is mild prominence of the central pulmonary vascularity which is at least in part due to the portable technique. When the patient can tolerate the procedure, a PA and lateral chest x-ray would be useful.   Electronically Signed   By: David  [place]   On: 04/06/2014 09:00     EKG Interpretation None      Date: 04/06/2014  Rate: 117  Rhythm: sinus tachycardia  QRS Axis: normal  Intervals: normal  ST/T Wave abnormalities: nonspecific T wave changes  Conduction Disutrbances:none  Narrative Interpretation:   Old EKG Reviewed: changes noted    MDM   Final diagnoses:  Viral syndrome  Leukocytosis    BP 107/53  Pulse 98  Temp(Src) 100.9 F (38.3 C) (Oral)  Resp 22  SpO2 97%   I have reviewed nursing notes and vital signs. I personally reviewed the imaging tests through PACS system  I reviewed available ER/hospitalization records thought the EMR   Fayrene Helper, PA-C 04/11/14 1021

## 2014-04-06 NOTE — ED Notes (Signed)
   Pt does not feel comfortable walking at this time, Pt said he will try again soon

## 2014-04-06 NOTE — Discharge Instructions (Signed)
Your fever and headache may be related to early viral infection.  Take tylenol or ibuprofen as needed for fever and headache. If your headache worsen please return to ER for close follow up.  You may follow up outpatient on Monday for further care.    Viral Infections A virus is a type of germ. Viruses can cause:  Minor sore throats.  Aches and pains.  Headaches.  Runny nose.  Rashes.  Watery eyes.  Tiredness.  Coughs.  Loss of appetite.  Feeling sick to your stomach (nausea).  Throwing up (vomiting).  Watery poop (diarrhea). HOME CARE   Only take medicines as told by your doctor.  Drink enough water and fluids to keep your pee (urine) clear or pale yellow. Sports drinks are a good choice.  Get plenty of rest and eat healthy. Soups and broths with crackers or rice are fine. GET HELP RIGHT AWAY IF:   You have a very bad headache.  You have shortness of breath.  You have chest pain or neck pain.  You have an unusual rash.  You cannot stop throwing up.  You have watery poop that does not stop.  You cannot keep fluids down.  You or your child has a temperature by mouth above 102 F (38.9 C), not controlled by medicine.  Your baby is older than 3 months with a rectal temperature of 102 F (38.9 C) or higher.  Your baby is 79 months old or younger with a rectal temperature of 100.4 F (38 C) or higher. MAKE SURE YOU:   Understand these instructions.  Will watch this condition.  Will get help right away if you are not doing well or get worse. Document Released: 06/11/2008 Document Revised: 09/21/2011 Document Reviewed: 11/04/2010 [facility] Patient Information 2015 [place], [place]. This information is not intended to replace advice given to you by your health care provider. Make sure you discuss any questions you have with your health care provider.

## 2014-04-09 ENCOUNTER — Inpatient Hospital Stay (HOSPITAL_COMMUNITY)
Admission: EM | Admit: 2014-04-09 | Discharge: 2014-04-15 | DRG: 872 | Disposition: A | Discharge status: Home / Self Care | Payer: Self-pay | Attending: Internal Medicine | Admitting: Internal Medicine

## 2014-04-09 ENCOUNTER — Encounter (HOSPITAL_COMMUNITY): Payer: Self-pay | Admitting: Emergency Medicine

## 2014-04-09 ENCOUNTER — Inpatient Hospital Stay: Payer: Self-pay | Admitting: Internal Medicine

## 2014-04-09 DIAGNOSIS — L0390 Cellulitis, unspecified: Secondary | ICD-10-CM | POA: Insufficient documentation

## 2014-04-09 DIAGNOSIS — L03116 Cellulitis of left lower limb: Secondary | ICD-10-CM | POA: Diagnosis present

## 2014-04-09 DIAGNOSIS — J40 Bronchitis, not specified as acute or chronic: Secondary | ICD-10-CM | POA: Insufficient documentation

## 2014-04-09 DIAGNOSIS — L03119 Cellulitis of unspecified part of limb: Secondary | ICD-10-CM

## 2014-04-09 DIAGNOSIS — R609 Edema, unspecified: Secondary | ICD-10-CM | POA: Diagnosis present

## 2014-04-09 DIAGNOSIS — Z885 Allergy status to narcotic agent status: Secondary | ICD-10-CM

## 2014-04-09 DIAGNOSIS — E669 Obesity, unspecified: Secondary | ICD-10-CM | POA: Insufficient documentation

## 2014-04-09 DIAGNOSIS — A419 Sepsis, unspecified organism: Principal | ICD-10-CM | POA: Diagnosis present

## 2014-04-09 DIAGNOSIS — D696 Thrombocytopenia, unspecified: Secondary | ICD-10-CM | POA: Diagnosis present

## 2014-04-09 DIAGNOSIS — R238 Other skin changes: Secondary | ICD-10-CM

## 2014-04-09 DIAGNOSIS — I10 Essential (primary) hypertension: Secondary | ICD-10-CM | POA: Diagnosis present

## 2014-04-09 DIAGNOSIS — R509 Fever, unspecified: Secondary | ICD-10-CM | POA: Diagnosis present

## 2014-04-09 DIAGNOSIS — Z6841 Body Mass Index (BMI) 40.0 and over, adult: Secondary | ICD-10-CM

## 2014-04-09 DIAGNOSIS — L02419 Cutaneous abscess of limb, unspecified: Secondary | ICD-10-CM

## 2014-04-09 HISTORY — DX: Cellulitis of unspecified part of limb: L03.119

## 2014-04-09 HISTORY — DX: Cutaneous abscess of limb, unspecified: L02.419

## 2014-04-09 LAB — LACTIC ACID, PLASMA: Lactic Acid, Venous: 1.2 mmol/L (ref 0.5–2.2)

## 2014-04-09 LAB — COMPREHENSIVE METABOLIC PANEL
ALBUMIN: 2.9 g/dL — AB (ref 3.5–5.2)
ALT: 30 U/L (ref 0–53)
AST: 27 U/L (ref 0–37)
Alkaline Phosphatase: 65 U/L (ref 39–117)
Anion gap: 12 (ref 5–15)
BUN: 9 mg/dL (ref 6–23)
CALCIUM: 9 mg/dL (ref 8.4–10.5)
CHLORIDE: 102 meq/L (ref 96–112)
CO2: 25 mEq/L (ref 19–32)
CREATININE: 0.86 mg/dL (ref 0.50–1.35)
GFR calc Af Amer: >90 mL/min (ref >90)
GFR calc non Af Amer: >90 mL/min (ref >90)
Glucose, Bld: 119 mg/dL — ABNORMAL HIGH (ref 70–99)
Potassium: 4.1 mEq/L (ref 3.7–5.3)
Sodium: 139 mEq/L (ref 137–147)
Total Bilirubin: 0.4 mg/dL (ref 0.3–1.2)
Total Protein: 7.7 g/dL (ref 6.0–8.3)

## 2014-04-09 LAB — CBC WITH DIFFERENTIAL/PLATELET
BASOS ABS: 0 10*3/uL (ref 0.0–0.1)
Basophils Absolute: 0 10*3/uL (ref 0.0–0.1)
Basophils Relative: 0 % (ref 0–1)
Basophils Relative: 0 % (ref 0–1)
Eosinophils Absolute: 0 10*3/uL (ref 0.0–0.7)
Eosinophils Absolute: 0.1 10*3/uL (ref 0.0–0.7)
Eosinophils Relative: 0 % (ref 0–5)
Eosinophils Relative: 1 % (ref 0–5)
HCT: 39 % (ref 39.0–52.0)
HEMATOCRIT: 39.4 % (ref 39.0–52.0)
HEMOGLOBIN: 13.4 g/dL (ref 13.0–17.0)
Hemoglobin: 13.4 g/dL (ref 13.0–17.0)
LYMPHS ABS: 1.3 10*3/uL (ref 0.7–4.0)
LYMPHS ABS: 1.6 10*3/uL (ref 0.7–4.0)
LYMPHS PCT: 12 % (ref 12–46)
LYMPHS PCT: 16 % (ref 12–46)
MCH: 30.2 pg (ref 26.0–34.0)
MCH: 29.9 pg (ref 26.0–34.0)
MCHC: 34 g/dL (ref 30.0–36.0)
MCHC: 34.4 g/dL (ref 30.0–36.0)
MCV: 88.9 fL (ref 78.0–100.0)
MCV: 87.1 fL (ref 78.0–100.0)
MONO ABS: 0.7 10*3/uL (ref 0.1–1.0)
Monocytes Absolute: 0.7 10*3/uL (ref 0.1–1.0)
Monocytes Relative: 7 % (ref 3–12)
Monocytes Relative: 7 % (ref 3–12)
NEUTROS ABS: 7.7 10*3/uL (ref 1.7–7.7)
NEUTROS ABS: 9.1 10*3/uL — AB (ref 1.7–7.7)
NEUTROS PCT: 76 % (ref 43–77)
Neutrophils Relative %: 81 % — ABNORMAL HIGH (ref 43–77)
PLATELETS: 132 10*3/uL — AB (ref 150–400)
Platelets: 117 10*3/uL — ABNORMAL LOW (ref 150–400)
RBC: 4.43 MIL/uL (ref 4.22–5.81)
RBC: 4.48 MIL/uL (ref 4.22–5.81)
RDW: 13.2 % (ref 11.5–15.5)
RDW: 13.3 % (ref 11.5–15.5)
WBC: 10.1 10*3/uL (ref 4.0–10.5)
WBC: 11.2 10*3/uL — AB (ref 4.0–10.5)

## 2014-04-09 LAB — GRAM STAIN

## 2014-04-09 LAB — BASIC METABOLIC PANEL
Anion gap: 12 (ref 5–15)
BUN: 7 mg/dL (ref 6–23)
CHLORIDE: 103 meq/L (ref 96–112)
CO2: 24 mEq/L (ref 19–32)
Calcium: 8.5 mg/dL (ref 8.4–10.5)
Creatinine, Ser: 0.72 mg/dL (ref 0.50–1.35)
GFR calc non Af Amer: >90 mL/min (ref >90)
Glucose, Bld: 122 mg/dL — ABNORMAL HIGH (ref 70–99)
POTASSIUM: 3.7 meq/L (ref 3.7–5.3)
SODIUM: 139 meq/L (ref 137–147)

## 2014-04-09 LAB — I-STAT CG4 LACTIC ACID, ED: LACTIC ACID, VENOUS: 1.62 mmol/L (ref 0.5–2.2)

## 2014-04-09 MED ORDER — ONDANSETRON HCL 4 MG/2ML IJ SOLN: 4.0000 mg | Freq: Four times a day (QID) | INTRAMUSCULAR | Status: DC | PRN

## 2014-04-09 MED ORDER — INFLUENZA VAC SPLIT QUAD 0.5 ML IM SUSY
0.5000 mL | PREFILLED_SYRINGE | INTRAMUSCULAR | Status: AC
  Administered 2014-04-09: 0.5 mL via INTRAMUSCULAR
  Filled 2014-04-09 (×3): qty 0.5

## 2014-04-09 MED ORDER — CLINDAMYCIN PHOSPHATE 600 MG/50ML IV SOLN
600.0000 mg | Freq: Once | INTRAVENOUS | Status: AC
Start: 2014-04-09 — End: 2014-04-09
  Administered 2014-04-09: 600 mg via INTRAVENOUS
  Filled 2014-04-09: qty 50

## 2014-04-09 MED ORDER — ONDANSETRON HCL 4 MG PO TABS: 4.0000 mg | ORAL_TABLET | Freq: Four times a day (QID) | ORAL | Status: DC | PRN

## 2014-04-09 MED ORDER — SODIUM CHLORIDE 0.9 % IV SOLN
INTRAVENOUS | Status: DC
  Administered 2014-04-09: 1000 mL via INTRAVENOUS
  Administered 2014-04-09: 04:00:00 via INTRAVENOUS

## 2014-04-09 MED ORDER — PIPERACILLIN-TAZOBACTAM 3.375 G IVPB 30 MIN
3.3750 g | Freq: Once | INTRAVENOUS | Status: AC
Start: 2014-04-09 — End: 2014-04-09
  Administered 2014-04-09: 3.375 g via INTRAVENOUS
  Filled 2014-04-09: qty 50

## 2014-04-09 MED ORDER — ACETAMINOPHEN 325 MG PO TABS
650.0000 mg | ORAL_TABLET | Freq: Three times a day (TID) | ORAL | Status: DC | PRN
  Administered 2014-04-09 – 2014-04-14 (×6): 650 mg via ORAL
  Filled 2014-04-09 (×6): qty 2

## 2014-04-09 MED ORDER — VANCOMYCIN HCL 10 G IV SOLR
2500.0000 mg | Freq: Once | INTRAVENOUS | Status: AC
  Administered 2014-04-09: 2500 mg via INTRAVENOUS
  Filled 2014-04-09: qty 2500

## 2014-04-09 MED ORDER — PIPERACILLIN-TAZOBACTAM 3.375 G IVPB
3.3750 g | Freq: Three times a day (TID) | INTRAVENOUS | Status: DC
  Administered 2014-04-09 – 2014-04-12 (×10): 3.375 g via INTRAVENOUS
  Filled 2014-04-09 (×13): qty 50

## 2014-04-09 MED ORDER — VANCOMYCIN HCL 10 G IV SOLR
1500.0000 mg | Freq: Two times a day (BID) | INTRAVENOUS | Status: DC
  Administered 2014-04-09 – 2014-04-12 (×7): 1500 mg via INTRAVENOUS
  Filled 2014-04-09 (×8): qty 1500

## 2014-04-09 MED ORDER — VANCOMYCIN HCL IN DEXTROSE 1-5 GM/200ML-% IV SOLN: 1000.0000 mg | Freq: Once | INTRAVENOUS | Status: DC

## 2014-04-09 MED ORDER — HEPARIN SODIUM (PORCINE) 5000 UNIT/ML IJ SOLN
5000.0000 [IU] | Freq: Three times a day (TID) | INTRAMUSCULAR | Status: DC
  Administered 2014-04-09 – 2014-04-15 (×19): 5000 [IU] via SUBCUTANEOUS
  Filled 2014-04-09 (×24): qty 1

## 2014-04-09 MED ORDER — IBUPROFEN 800 MG PO TABS
800.0000 mg | ORAL_TABLET | Freq: Three times a day (TID) | ORAL | Status: DC | PRN
  Administered 2014-04-09: 800 mg via ORAL
  Filled 2014-04-09 (×3): qty 1

## 2014-04-09 NOTE — Progress Notes (Signed)
Patient admitted to unit via stretcher to [phone]. Pt alert and oriented x4, in no distress. IV intact. Skin - large blister to lower left calf with gauze in place, no drainage noted. Pt oriented to room and unit, call bell within reach and pt understands how to get into contact with nursing staff. WIll continue to monitor patient per MD orders.

## 2014-04-09 NOTE — ED Provider Notes (Signed)
CSN: [id number]     Arrival date & time 04/09/14  0002 History   First MD Initiated Contact with Patient 04/09/14 0115     Chief Complaint  Patient presents with  . Cellulitis  . Leg Pain     (Consider location/radiation/quality/duration/timing/severity/associated sxs/prior Treatment) HPI 53 yo male presents to the ER from home with complaint of increasing pain and redness to left leg with large bump.  Pt was seen in the ER on Friday with hot and cold spells, headache, shortness of breath.  He had workup that showed leukocytosis.  He had temp at that time of 103, elevated ddimer but negative CTA chest.  Pt reports those symptoms have improved although he is still having some fevers.  Pt reports waking at 3 am after his ER visit with left leg pain.  Pt reports persistent and worsening left leg pain during the day while at work.  He noticed redness and a small blister around 5 pm on Saturday, earlier today.  Blister now has increased in size and is tense.  No new medications, exposures, trauma to the area.  No h/o cellulitis or skin infection (pmh appears to be wrong).  Pt is not a diabetic.   Red area has rapidly increased over the last few hours. Past Medical History  Diagnosis Date  . Hypertension   . Skin infection   . Obesity   . Bronchitis    History reviewed. No pertinent past surgical history. History reviewed. No pertinent family history. History  Substance Use Topics  . Smoking status: Never Smoker   . Smokeless tobacco: Not on file  . Alcohol Use: No    Review of Systems  All other systems reviewed and are negative.     Allergies  Oxycodone  Home Medications   Prior to Admission medications   Medication Sig Start Date End Date Taking? Authorizing Provider  acetaminophen (TYLENOL) 500 MG tablet Take 1 tablet (500 mg total) by mouth every 6 (six) hours as needed. 04/06/14   Fayrene Helper, PA-C  ibuprofen (ADVIL,MOTRIN) 800 MG tablet Take 1 tablet (800 mg total) by mouth  every 8 (eight) hours as needed for headache. 04/06/14   Fayrene Helper, PA-C  lisinopril (PRINIVIL,ZESTRIL) 20 MG tablet Take 1 tablet (20 mg total) by mouth daily. 04/06/14   Fayrene Helper, PA-C  naproxen sodium (ANAPROX) 220 MG tablet Take 440 mg by mouth daily.    Historical Provider, MD   BP 161/103  Pulse 114  Temp(Src) 100.2 F (37.9 C) (Oral)  Resp 22  Ht  (1.778 m)  Wt 364 lb (165.109 kg)  BMI 52.23 kg/m2  SpO2 96% Physical Exam  Nursing note and vitals reviewed. Constitutional: He is oriented to person, place, and time. He appears well-developed and well-nourished.  HENT:  Head: Normocephalic and atraumatic.  Nose: Nose normal.  Mouth/Throat: Oropharynx is clear and moist.  Eyes: Conjunctivae and EOM are normal. Pupils are equal, round, and reactive to light.  Neck: Normal range of motion. Neck supple. No JVD present. No tracheal deviation present. No thyromegaly present.  Cardiovascular: Normal rate, regular rhythm, normal heart sounds and intact distal pulses.  Exam reveals no gallop and no friction rub.   No murmur heard. Pulmonary/Chest: Effort normal and breath sounds normal. No stridor. No respiratory distress. He has no wheezes. He has no rales. He exhibits no tenderness.  Abdominal: Soft. Bowel sounds are normal. He exhibits no distension and no mass. There is no tenderness. There is no rebound  and no guarding.  Musculoskeletal: Normal range of motion. He exhibits edema and tenderness.  Area of erythema and warmth to left anterior leg with several bullous lesions, largest of which is 6 cm.  He has other smaller developing lesions.  Area is tender to touch  Lymphadenopathy:    He has no cervical adenopathy.  Neurological: He is alert and oriented to person, place, and time. He displays normal reflexes. He exhibits normal muscle tone. Coordination normal.  Skin: Skin is warm and dry. No rash noted. No erythema. No pallor.  Psychiatric: He has a normal mood and affect. His  behavior is normal. Judgment and thought content normal.    ED Course  Procedures (including critical care time) Labs Review Labs Reviewed  COMPREHENSIVE METABOLIC PANEL - Abnormal; Notable for the following:    Glucose, Bld 119 (*)    Albumin 2.9 (*)    All other components within normal limits  CBC WITH DIFFERENTIAL - Abnormal; Notable for the following:    WBC 11.2 (*)    Platelets 132 (*)    Neutrophils Relative % 81 (*)    Neutro Abs 9.1 (*)    All other components within normal limits  I-STAT CG4 LACTIC ACID, ED    Imaging Review No results found.   EKG Interpretation None            MDM   Final diagnoses:  Cellulitis of left lower extremity  Bullous lesion    53 yo male with rapidly progressing bullous cellulitis, low grade fever.  Will d/w hospitalist for obs for iv abx.   3:08 AM INCISION AND DRAINAGE Performed by: Olivia Mackie Consent: Verbal consent obtained. Risks and benefits: risks, benefits and alternatives were discussed Type: bullous lesion  Body area: left lower medial leg  Anesthesia: none  Incision was made with a 21 gauge needle    Complexity: simple   Drainage: clear yellow fluid  Drainage amount: copious, 10 cc collected for culture  Dressed with gauze and paper tape  Patient tolerance: Patient tolerated the procedure well with no immediate complications.     Olivia Mackie, MD 04/09/14 [phone]

## 2014-04-09 NOTE — ED Notes (Signed)
Pt transported to 5W, via stretcher by [place] EMT. Pt has belongings at bedside

## 2014-04-09 NOTE — H&P (Addendum)
[facility] History and Physical  [name]:[id number] DOB: [birth year] DOA: 04/09/2014  Referring physician: ED physician PCP: No PCP Per Patient  Specialists:   Chief Complaint: left leg swelling and pain.  HPI: [name] is a 53 y.o. male with hx of migraine headache, who presents with left leg swelling and pain.   Patient reports that he had fever, chills and shortness of breath in the morning of 04/06/14. He was seen in ED at the same day. He had an elevated d-dimer, but with a negative CTA chest for pulmonary embolism. He was discharged home with Tylenol. After he went home, his shortness of breath resolved completely. He still has fever and chills. He also has moderate headache. On 9/26, he noticed that his left leg started to swell and become red and very painful.  He noticed a small blister around 5 pm on Saturday, which has been increasing in size. Red area has rapidly increased over the last few hours. Therefore, he comes to the hospital for treatment. He does not have cough, chest pain, shortness of breath, nausea, vomiting, abdominal pain, diarrhea. No recent trauma to his leg. No new medications. He does not have diabetes.   In ED, he was found to have temperature 100.2, tachycardia, leukocytosis with WBC 11.2. He is admitted to the inpatient for further evaluation and treatment.   Review of Systems: As presented in the history of presenting illness, rest negative.  Can patient participate in ADLs? Yes  Allergy:  Allergies  Allergen Reactions  . Oxycodone Shortness Of Breath    Past Medical History  Diagnosis Date  . Hypertension   . Skin infection   . Obesity   . Bronchitis     History reviewed. No pertinent past surgical history.  Social History:  reports that he has never smoked. He does not have any smokeless tobacco history on file. He reports that he does not drink alcohol or use illicit drugs.  Family History:  Family History  Problem  Relation Age of Onset  . Diabetes Mother   . Alcohol abuse Father   . Hypertension Brother   . Asthma Sister      Prior to Admission medications   Medication Sig Start Date End Date Taking? Authorizing Provider  ibuprofen (ADVIL,MOTRIN) 800 MG tablet Take 1 tablet (800 mg total) by mouth every 8 (eight) hours as needed for headache. 04/06/14  Yes Fayrene Helper, PA-C  acetaminophen (TYLENOL) 500 MG tablet Take 1 tablet (500 mg total) by mouth every 6 (six) hours as needed. 04/06/14   Fayrene Helper, PA-C  lisinopril (PRINIVIL,ZESTRIL) 20 MG tablet Take 1 tablet (20 mg total) by mouth daily. 04/06/14   Fayrene Helper, PA-C  naproxen sodium (ANAPROX) 220 MG tablet Take 440 mg by mouth daily.    Historical Provider, MD    Physical Exam: Filed Vitals:   04/09/14 0034 04/09/14 0224  BP: 161/103 167/87  Pulse: 114   Temp: 100.2 F (37.9 C)   TempSrc: Oral   Resp: 22 22  Height:  (1.778 m)   Weight: 165.109 kg (364 lb)   SpO2: 96% 99%   General: Not in acute distress HEENT:       Eyes: PERRL, EOMI, no scleral icterus       ENT: No discharge from the ears and nose, no pharynx injection, no tonsillar enlargement.        Neck: No JVD, no bruit, no mass felt. Cardiac: S1/S2, RRR, No murmurs, gallops or  rubs Pulm: Good air movement bilaterally. Clear to auscultation bilaterally. No rales, wheezing, rhonchi or rubs. Abd: Soft, nondistended, nontender, no rebound pain, no organomegaly, BS present  Ext:  2+DP/PT pulse bilaterally. Has swelling, erythema, tendernss and warmth over left anterior leg with several bullous lesions, largest of which is proximately 6 cm. Please see the picture that was taken by Dr. Norlene Campbell in he note.   Musculoskeletal: No joint deformities, erythema, or stiffness Skin:has bullous over left lower leg as above.  Neuro: Alert and oriented X3, cranial nerves II-XII grossly intact, muscle strength 5/5 in all extremeties, sensation to light touch intact. Brachial reflex 2+  bilaterally. Knee reflex 1+ bilaterally. Negative Babinski's sign. Normal finger to nose test. Psych: Patient is not psychotic, no suicidal or hemocidal ideation.  Labs on Admission:  Basic Metabolic Panel:  Recent Labs Lab 04/06/14 0947 04/09/14 0047  NA 139 139  K 3.8 4.1  CL 106 102  CO2  --  25  GLUCOSE 120* 119*  BUN 12 9  CREATININE 0.70 0.86  CALCIUM  --  9.0   Liver Function Tests:  Recent Labs Lab 04/09/14 0047  AST 27  ALT 30  ALKPHOS 65  BILITOT 0.4  PROT 7.7  ALBUMIN 2.9*   No results found for this basename: LIPASE, AMYLASE,  in the last 168 hours No results found for this basename: AMMONIA,  in the last 168 hours CBC:  Recent Labs Lab 04/06/14 0915 04/06/14 0947 04/09/14 0047  WBC 19.1*  --  11.2*  NEUTROABS 17.2*  --  9.1*  HGB 13.7 15.3 13.4  HCT 40.1 45.0 39.4  MCV 87.7  --  88.9  PLT 124*  --  132*   Cardiac Enzymes: No results found for this basename: CKTOTAL, CKMB, CKMBINDEX, TROPONINI,  in the last 168 hours  BNP (last 3 results)  Recent Labs  11/07/13 1947  PROBNP 74.4   CBG: No results found for this basename: GLUCAP,  in the last 168 hours  Radiological Exams on Admission: No results found.  Assessment/Plan Principal Problem:   Cellulitis of left leg Active Problems:   Hypertension   Sepsis  1. cellulitis of left leg and sepsis: it seems to be bullous cellulitis. Clindamycin IV was started in ED. Given it is rapidly growing in size and that he is in sepsis, I will switch to broader coverage antibiotics from now and narrow down later. Currently patient is mildly septic with leukocytosis, tachycardia and fever. His blood pressure is still stable, but he is at risk of developing septic shock. Dr Norlene Campbell drained the bullous lesion and sent out culture.   - will admit to MedSurg bed - Empiric antimicrobial treatment with vancomycin IV and zosyn - IVF: 125cc/h, will bolus NS if bp drops. - hold lisinopril given the risk of  developing septic shock. - Blood cultures x 2  - Symptomatic treatment: tylenol and buprofen for fever and pain, prn zofran for nausea - Check lactic acid  2. HTN: bp is 149/74 on admission. On Lisinopril at home - Hold lisinopril as above - monitor bp closely.   DVT ppx: SQ Heparin   Code Status: Full code Family Communication: None at bed side.  Disposition Plan: Admit to inpatient  Lorretta Harp [facility] Pager [phone]  If 7PM-7AM, please contact night-coverage [email or web address] Password [facility] 04/09/2014, 3:45 AM

## 2014-04-09 NOTE — Progress Notes (Signed)
Made attempt to call report . Left number with secretary for ED RN to call back at [phone].

## 2014-04-09 NOTE — Progress Notes (Signed)
ANTIBIOTIC CONSULT NOTE - INITIAL  Pharmacy Consult for Zosyn, Vancomycin  Indication: Cellulitis   Allergies  Allergen Reactions  . Oxycodone Shortness Of Breath    Patient Measurements: Height:  (177.8 cm) Weight: 364 lb (165.109 kg) IBW/kg (Calculated) : 73  Vital Signs: Temp: 100.2 F (37.9 C) (09/28 0034) Temp src: Oral (09/28 0034) BP: 167/87 mmHg (09/28 0224) Pulse Rate: 114 (09/28 0034) Intake/Output from previous day:   Intake/Output from this shift:    Labs:  Recent Labs  04/06/14 0915 04/06/14 0947 04/09/14 0047  WBC 19.1*  --  11.2*  HGB 13.7 15.3 13.4  PLT 124*  --  132*  CREATININE  --  0.70 0.86   Estimated Creatinine Clearance: 157.8 ml/min (by C-G formula based on Cr of 0.86). No results found for this basename: VANCOTROUGH, VANCOPEAK, VANCORANDOM, GENTTROUGH, GENTPEAK, GENTRANDOM, TOBRATROUGH, TOBRAPEAK, TOBRARND, AMIKACINPEAK, AMIKACINTROU, AMIKACIN,  in the last 72 hours   Microbiology: No results found for this or any previous visit (from the past 720 hour(s)).  Medical History: Past Medical History  Diagnosis Date  . Hypertension   . Skin infection   . Obesity   . Bronchitis     Medications:   (Not in a hospital admission) Assessment: 18 YOM presented to the ED with increasing pain and redness to left leg with large bump. Pharmacy consulted to start Vancomycin and Zosyn as empiric therapy for cellulitis. WBC is elevated at 11.2. Tmax 100.2 F. CrCL > 100 mL/min   Goal of Therapy:  Vancomycin trough level 10-15 mcg/ml  Plan:  1) Give Vancomycin 2500 mg IV load followed by 1500 mg IV Q 12 hours  2) Give Zosyn 3.375 gm IV Q 8 hours  3) Monitor CBC, renal fx, cultures and patient's clinical progress 4) F/u VT at [facility], PharmD.  Clinical Pharmacist Pager [phone]

## 2014-04-09 NOTE — ED Notes (Addendum)
Pt c/o left lower leg pain since Friday. Pt presents with swelling, redness, and large blister to left ower leg.

## 2014-04-09 NOTE — Progress Notes (Signed)
[facility]  53/ y/o male with cellulitis of left lower extremity admitted overnight.   Cont Vanc and Zosyn- obtain venous duplex of left leg- follow blood cultures- elevate leg.   Thrombocytopenia- follow- cont Heparin for now as we cannot use SCDs.   Calvert Cantor, MD

## 2014-04-09 NOTE — Progress Notes (Signed)
Report taken from [place], [place] in ED

## 2014-04-09 NOTE — Plan of Care (Signed)
Problem: Phase I Progression Outcomes Goal: Initial discharge plan identified Outcome: Completed/Met Date Met:  04/09/14 To return home

## 2014-04-10 ENCOUNTER — Encounter (HOSPITAL_COMMUNITY): Payer: Self-pay | Admitting: General Practice

## 2014-04-10 DIAGNOSIS — M7989 Other specified soft tissue disorders: Secondary | ICD-10-CM

## 2014-04-10 MED ORDER — IBUPROFEN 400 MG PO TABS
400.0000 mg | ORAL_TABLET | ORAL | Status: DC | PRN
  Administered 2014-04-10 – 2014-04-12 (×3): 400 mg via ORAL
  Filled 2014-04-10 (×4): qty 1

## 2014-04-10 MED ORDER — LISINOPRIL 20 MG PO TABS
20.0000 mg | ORAL_TABLET | Freq: Every day | ORAL | Status: DC
  Administered 2014-04-10 – 2014-04-15 (×6): 20 mg via ORAL
  Filled 2014-04-10 (×6): qty 1

## 2014-04-10 NOTE — Clinical Documentation Improvement (Addendum)
BMI noted as 52.23 in ED Provider note 04/09/14; nursing documents as 52.3.  Obesity reflected in current record.  Please document in your progress note and discharge summary if obesity can be further specified.    Possible Clinical Conditions: -Morbid Obesity -Obesity -Other -Unable to determine at present  Thank you, Doy MinceVangela Shiasia Porro, RN [phone] Clinical Documentation Specialist

## 2014-04-10 NOTE — Progress Notes (Signed)
[facility] Progress Note   ERVING SASSANO ION:[id number] DOB: 1961/01/06 DOA: 04/09/2014 PCP: No PCP Per Patient  Brief narrative: DAEKWON BESWICK is a 53 y.o. male admitted for left leg swelling and redness. He began to have fevers a couple of days prior these changes in his legs and came to the rER on 9/25- D Dimer was elevated, CTA chest negative for PE. He was sent home with PRN Tylenol for fevers. On 9/26, he noted the changes in his legs and returned to the ER on 9/28 for admission for cellulitis.    Subjective: Leg feels about the same  Assessment/Plan: Principal Problem:   Cellulitis of left leg--  Sepsis - severe edema (large bleb yesterday which drained) and erythema- may be erysipelas which in the leg, due to damage from toxins,  can take and extensive amount of time to resolve -  cont Vanc and Zosyn- f/u blood cultures which are thus far negative - elevate leg as much as possible -follow up on venous Duplex for DVT   Active Problems:   Hypertension - resume Lisinoprl  Thrombocytopenia - from sepsis- cont to follow- cont Heparin for now as we are unable to use SCDs    Code Status: full code Family Communication: none Disposition Plan: home DVT prophylaxis: heparin  Consultants: none  Procedures: none  Antibiotics: Anti-infectives   Start     Dose/Rate Route Frequency Ordered Stop   04/09/14 1600  vancomycin (VANCOCIN) 1,500 mg in sodium chloride 0.9 % 500 mL IVPB     1,500 mg 250 mL/hr over 120 Minutes Intravenous Every 12 hours 04/09/14 0457     04/09/14 1300  piperacillin-tazobactam (ZOSYN) IVPB 3.375 g     3.375 g 12.5 mL/hr over 240 Minutes Intravenous 3 times per day 04/09/14 0457     04/09/14 0345  piperacillin-tazobactam (ZOSYN) IVPB 3.375 g     3.375 g 100 mL/hr over 30 Minutes Intravenous  Once 04/09/14 0334 04/09/14 0628   04/09/14 0345  vancomycin (VANCOCIN) IVPB 1000 mg/200 mL premix  Status:  Discontinued     1,000 mg 200 mL/hr over 60  Minutes Intravenous  Once 04/09/14 0334 04/09/14 0338   04/09/14 0345  vancomycin (VANCOCIN) 2,500 mg in sodium chloride 0.9 % 500 mL IVPB     2,500 mg 250 mL/hr over 120 Minutes Intravenous  Once 04/09/14 0339 04/09/14 0601   04/09/14 0215  clindamycin (CLEOCIN) IVPB 600 mg     600 mg 100 mL/hr over 30 Minutes Intravenous  Once 04/09/14 0210 04/09/14 0259         Objective: Filed Weights   04/09/14 0034 04/09/14 0424  Weight: 165.109 kg (364 lb) 158.6 kg (349 lb 10.4 oz)    Intake/Output Summary (Last 24 hours) at 04/10/14 1046 Last data filed at 04/10/14 1021  Gross per 24 hour  Intake 1099.92 ml  Output   1350 ml  Net -250.08 ml     Vitals Filed Vitals:   04/09/14 1328 04/09/14 2000 04/09/14 2103 04/10/14 0624  BP: 121/65  143/70 132/61  Pulse: 84  87 69  Temp: 98.8 F (37.1 C)  100.1 F (37.8 C) 97.6 F (36.4 C)  TempSrc: Oral  Oral Oral  Resp: 20 18 20 18   Height:      Weight:      SpO2: 100%  97% 92%    Exam: General: No acute respiratory distress Lungs: Clear to auscultation bilaterally without wheezes or crackles Cardiovascular: Regular rate and rhythm without murmur  gallop or rub normal S1 and S2 Abdomen: Nontender, nondistended, soft, bowel sounds positive, no rebound, no ascites, no appreciable mass Extremities: No significant cyanosis- extensive edema erythema of left foot and leg with denuded skin in upper leg due to large bleb which ruptured yesterday.   Data Reviewed: Basic Metabolic Panel:  Recent Labs Lab 04/06/14 0947 04/09/14 0047 04/09/14 0524  NA 139 139 139  K 3.8 4.1 3.7  CL 106 102 103  CO2  --  25 24  GLUCOSE 120* 119* 122*  BUN 12 9 7   CREATININE 0.70 0.86 0.72  CALCIUM  --  9.0 8.5   Liver Function Tests:  Recent Labs Lab 04/09/14 0047  AST 27  ALT 30  ALKPHOS 65  BILITOT 0.4  PROT 7.7  ALBUMIN 2.9*   No results found for this basename: LIPASE, AMYLASE,  in the last 168 hours No results found for this basename:  AMMONIA,  in the last 168 hours CBC:  Recent Labs Lab 04/06/14 0915 04/06/14 0947 04/09/14 0047 04/09/14 0524  WBC 19.1*  --  11.2* 10.1  NEUTROABS 17.2*  --  9.1* 7.7  HGB 13.7 15.3 13.4 13.4  HCT 40.1 45.0 39.4 39.0  MCV 87.7  --  88.9 87.1  PLT 124*  --  132* 117*   Cardiac Enzymes: No results found for this basename: CKTOTAL, CKMB, CKMBINDEX, TROPONINI,  in the last 168 hours BNP (last 3 results)  Recent Labs  11/07/13 1947  PROBNP 74.4   CBG: No results found for this basename: GLUCAP,  in the last 168 hours  Recent Results (from the past 240 hour(s))  BODY FLUID CULTURE     Status: None   Collection Time    04/09/14  4:00 AM      Result Value Ref Range Status   Specimen Description FLUID LEFT LOWER LEG   Final   Special Requests NONE   Final   Gram Stain     Final   Value: RARE WBC PRESENT, PREDOMINANTLY PMN     NO ORGANISMS SEEN     Performed at [facility]     Performed at [facility]   Culture PENDING   Incomplete   Report Status PENDING   Incomplete  GRAM STAIN     Status: None   Collection Time    04/09/14  4:00 AM      Result Value Ref Range Status   Specimen Description FLUID LEFT LOWER LEG   Final   Special Requests NONE   Final   Gram Stain     Final   Value: RARE WBC PRESENT, PREDOMINANTLY PMN     NO ORGANISMS SEEN   Report Status 04/09/2014 FINAL   Final  CULTURE, BLOOD (ROUTINE X 2)     Status: None   Collection Time    04/09/14  5:42 AM      Result Value Ref Range Status   Specimen Description BLOOD LEFT HAND   Final   Special Requests BOTTLES DRAWN AEROBIC ONLY 4CC   Final   Culture  Setup Time     Final   Value: 04/09/2014 10:07     Performed at [facility]   Culture     Final   Value:        BLOOD CULTURE RECEIVED NO GROWTH TO DATE CULTURE WILL BE HELD FOR 5 DAYS BEFORE ISSUING A FINAL NEGATIVE REPORT     Performed at [facility]   Report Status  PENDING   Incomplete     Studies:  Recent x-ray  studies have been reviewed in detail by the Attending Physician  Scheduled Meds:  Scheduled Meds: . heparin  5,000 Units Subcutaneous 3 times per day  . piperacillin-tazobactam (ZOSYN)  IV  3.375 g Intravenous 3 times per day  . vancomycin  1,500 mg Intravenous Q12H   Continuous Infusions: . sodium chloride 1,000 mL (04/09/14 1634)    Time spent on care of this patient: 35 min   Jasmynn Pfalzgraf, MD 04/10/2014, 10:46 AM  LOS: 1 day   [facility] Office  [phone] Pager - Text Page per [email or web address]  If 7PM-7AM, please contact night-coverage [email or web address]

## 2014-04-10 NOTE — Progress Notes (Signed)
VASCULAR LAB PRELIMINARY  PRELIMINARY  PRELIMINARY  PRELIMINARY  Left lower extremity venous duplex completed.    Preliminary report:  Left:  No obvious evidence of DVT, superficial thrombosis, or Baker's cyst.  Technically limited by body habitus, edema, weeping wound.  [name], RVT 04/10/2014, 3:38 PM

## 2014-04-11 NOTE — ED Provider Notes (Signed)
CSN: [id number]     Arrival date & time 04/09/14  0002 History   First MD Initiated Contact with Patient 04/06/14 [id number]     Chief Complaint  Patient presents with  . Cellulitis  . Leg Pain     (Consider location/radiation/quality/duration/timing/severity/associated sxs/prior Treatment) Leg Pain Patient is a 53 y.o. male presenting with leg pain.    53 year old obese male with history of recurrent bronchitis, hypertension presents for evaluation of shortness of breath. Patient reports since last night he has been having fever, chills, sharp throbbing forehead headache, body aches and increased shortness of breath. Report lightheadedness with positional change. Symptom is gradual but has been persistent. Last normal was yesterday morning. Headache is similar to prior migraine headache. No associated runny nose, sneezing, coughing, sore throat, chest pain, productive cough, hemoptysis, nausea vomiting, diarrhea, abdominal pain or back pain, or diaphoresis. No specific treatment tried. Patient admits that he has history of high blood pressure but has not refilled his blood pressure medication for a month. Denies any recent sick contact. Patient is a nonsmoker, no recent surgery, prolonged bed rest, unilateral leg swelling or calf pain, no prior history of PE DVT, no active cancer.  Past Medical History  Diagnosis Date  . Hypertension   . Skin infection   . Obesity   . Bronchitis   . Cellulitis and abscess of lower extremity 04/11/2015   History reviewed. No pertinent past surgical history. Family History  Problem Relation Age of Onset  . Diabetes Mother   . Alcohol abuse Father   . Hypertension Brother   . Asthma Sister    History  Substance Use Topics  . Smoking status: Never Smoker   . Smokeless tobacco: Never Used  . Alcohol Use: No    Review of Systems  All other systems reviewed and are negative.     Allergies  Oxycodone  Home Medications   Prior to Admission  medications   Medication Sig Start Date End Date Taking? Authorizing Provider  acetaminophen (TYLENOL) 500 MG tablet Take 500 mg by mouth every 6 (six) hours as needed for fever. 04/06/14  Yes Fayrene Helper, PA-C  ibuprofen (ADVIL,MOTRIN) 800 MG tablet Take 1 tablet (800 mg total) by mouth every 8 (eight) hours as needed for headache. 04/06/14  Yes Fayrene Helper, PA-C  lisinopril (PRINIVIL,ZESTRIL) 20 MG tablet Take 1 tablet (20 mg total) by mouth daily. 04/06/14  Yes Fayrene Helper, PA-C   BP 140/77  Pulse 77  Temp(Src) 98.7 F (37.1 C) (Oral)  Resp 18  Ht 5\' 10"  (1.778 m)  Wt 349 lb 10.4 oz (158.6 kg)  BMI 50.17 kg/m2  SpO2 97% Physical Exam  Constitutional: He appears well-developed and well-nourished. No distress.  HENT:  Head: Atraumatic.  Right Ear: External ear normal.  Left Ear: External ear normal.  Nose: Nose normal.  Mouth/Throat: Oropharynx is clear and moist. No oropharyngeal exudate.  Eyes: Conjunctivae are normal.  Neck: Normal range of motion. Neck supple. No JVD present.  No nuchal rigidity  Cardiovascular:  Tachycardia without murmur rubs or gallops  Pulmonary/Chest: Effort normal and breath sounds normal. No respiratory distress. He has no wheezes. He has no rales. He exhibits no tenderness.  Abdominal: Soft. There is no tenderness.  Musculoskeletal: He exhibits no edema.  Neurological: He is alert.  Skin: Skin is warm. No rash noted.  Psychiatric: He has a normal mood and affect.    ED Course  Procedures (including critical care time)  Patient presents complaining of headache,  and shortness of breath. He is febrile with a temperature of 103. He is also tachycardic likely due to his fever. He denies any significant cough but endorsed shortness of breath. History of recurrent bronchitis. He does not have any significant risk factors for PE. He complains of a headache, has no meningismal sign on exam, I have low suspicion for meningitis.  We'll provide patient with IV  fluid, Tylenol for fever, chest x-ray to rule out pneumonia. His EKG shows tachycardia but no acute ischemic changes. He is hypertensive has not been taking his blood pressure medication one month. Patient agrees to resume his blood pressure medication and follow up closely with his doctor next week.  10:05 AM CXR unremarkable, but it's only 1 view, and may not be sensitive.  Since i cannot use PERC criteria to r/o PE, i will obtain d-dimer.  Will check basic labs.    10:05 AM  patient with leukocytosis, WBC is 19.1 with a left shift. D-dimer is mildly elevated at 0.75. A chest CTA without any obvious PE or pneumonia or CHF. The pulmonary arterial tree was not fully visualized.  However, given low suspicion for PE, i do not think VQ scan is indicated at this time.  Pt report his headache has resolved.  Fever resolved with antipyretic.  UA without signs of infection.  I discussed the finding with Dr. Wilkie AyeHorton.    10:05 AM Dr Wilkie AyeHorton has evaluated pt.  We both believe pt likely not having meningitis as he is feeling better and is without active headache or meningismal sign.  Suspect flu-like sxs causing his headache and fever. We discussed option of spinal tap but believe risk outweigh benefit at this time, pt agrees.  Strong return precaution discussed.  Social worker has talked to pt and set up outpt f/u next week on 9/28.    Labs Review Labs Reviewed  COMPREHENSIVE METABOLIC PANEL - Abnormal; Notable for the following:    Glucose, Bld 119 (*)    Albumin 2.9 (*)    All other components within normal limits  CBC WITH DIFFERENTIAL - Abnormal; Notable for the following:    WBC 11.2 (*)    Platelets 132 (*)    Neutrophils Relative % 81 (*)    Neutro Abs 9.1 (*)    All other components within normal limits  BASIC METABOLIC PANEL - Abnormal; Notable for the following:    Glucose, Bld 122 (*)    All other components within normal limits  CBC WITH DIFFERENTIAL - Abnormal; Notable for the following:     Platelets 117 (*)    All other components within normal limits  BODY FLUID CULTURE  GRAM STAIN  CULTURE, BLOOD (ROUTINE X 2)  CULTURE, BLOOD (ROUTINE X 2)  LACTIC ACID, PLASMA  I-STAT CG4 LACTIC ACID, ED    Imaging Review No results found.   EKG Interpretation None      Date: 04/06/2014  Rate: 117  Rhythm: sinus tachycardia  QRS Axis: normal  Intervals: normal  ST/T Wave abnormalities: nonspecific T wave changes  Conduction Disutrbances:none  Narrative Interpretation:   Old EKG Reviewed: changes noted    MDM   Final diagnoses:  Cellulitis of left lower extremity  Bullous lesion    BP 140/77  Pulse 77  Temp(Src) 98.7 F (37.1 C) (Oral)  Resp 18  Ht 5\' 10"  (1.778 m)  Wt 349 lb 10.4 oz (158.6 kg)  BMI 50.17 kg/m2  SpO2 97%   I have reviewed nursing notes and  vital signs. I personally reviewed the imaging tests through PACS system  I reviewed available ER/hospitalization records thought the EMR   Fayrene Helper, PA-C 04/11/14 1005

## 2014-04-11 NOTE — Progress Notes (Signed)
PRN tylenol given for temp of 101.7. Will continue to monitor patient

## 2014-04-11 NOTE — Progress Notes (Signed)
[facility] Progress Note   Christopher DroneBarry L Liford ION:[id number] DOB: 1960/11/17 DOA: 04/09/2014 PCP: No PCP Per Patient  Brief narrative: [name] is a 53 y.o. male admitted for left leg swelling and redness. He began to have fevers a couple of days prior these changes in his legs and came to the rER on 9/25- D Dimer was elevated, CTA chest negative for PE. He was sent home with PRN Tylenol for fevers. On 9/26, he noted the changes in his legs and returned to the ER on 9/28 for admission for cellulitis.    Subjective: Leg feels about the same  Assessment/Plan: Principal Problem:   Cellulitis of left leg--  Sepsis - severe edema (large bleb yesterday which drained) and erythema- may be erysipelas which in the leg, due to damage from toxins,  can take and extensive amount of time to resolve -  cont Vanc and Zosyn- f/u blood cultures which are thus far negative - elevate leg as much as possible -follow up on venous Duplex for DVT   Active Problems:   Hypertension - resume Lisinoprl  Thrombocytopenia - from sepsis- cont to follow- cont Heparin for now as we are unable to use SCDs    Code Status: full code Family Communication: none Disposition Plan: home DVT prophylaxis: heparin  Consultants: none  Procedures: none  Antibiotics: Anti-infectives   Start     Dose/Rate Route Frequency Ordered Stop   04/09/14 1600  vancomycin (VANCOCIN) 1,500 mg in sodium chloride 0.9 % 500 mL IVPB     1,500 mg 250 mL/hr over 120 Minutes Intravenous Every 12 hours 04/09/14 0457     04/09/14 1300  piperacillin-tazobactam (ZOSYN) IVPB 3.375 g     3.375 g 12.5 mL/hr over 240 Minutes Intravenous 3 times per day 04/09/14 0457     04/09/14 0345  piperacillin-tazobactam (ZOSYN) IVPB 3.375 g     3.375 g 100 mL/hr over 30 Minutes Intravenous  Once 04/09/14 0334 04/09/14 0628   04/09/14 0345  vancomycin (VANCOCIN) IVPB 1000 mg/200 mL premix  Status:  Discontinued     1,000 mg 200 mL/hr over 60  Minutes Intravenous  Once 04/09/14 0334 04/09/14 0338   04/09/14 0345  vancomycin (VANCOCIN) 2,500 mg in sodium chloride 0.9 % 500 mL IVPB     2,500 mg 250 mL/hr over 120 Minutes Intravenous  Once 04/09/14 0339 04/09/14 0601   04/09/14 0215  clindamycin (CLEOCIN) IVPB 600 mg     600 mg 100 mL/hr over 30 Minutes Intravenous  Once 04/09/14 0210 04/09/14 0259         Objective: Filed Weights   04/09/14 0034 04/09/14 0424  Weight: 165.109 kg (364 lb) 158.6 kg (349 lb 10.4 oz)    Intake/Output Summary (Last 24 hours) at 04/11/14 1758 Last data filed at 04/11/14 1335  Gross per 24 hour  Intake    600 ml  Output   1850 ml  Net  -1250 ml     Vitals Filed Vitals:   04/11/14 0000 04/11/14 0527 04/11/14 0957 04/11/14 1324  BP:  136/98 140/77 159/61  Pulse:  77  86  Temp:  98.7 F (37.1 C)  99.6 F (37.6 C)  TempSrc:  Oral  Oral  Resp: 17 18  20   Height:      Weight:      SpO2:  97%  97%    Exam: General: No acute respiratory distress Lungs: Clear to auscultation bilaterally without wheezes or crackles Cardiovascular: Regular rate and rhythm without  murmur gallop or rub normal S1 and S2 Abdomen: Nontender, nondistended, soft, bowel sounds positive, no rebound, no ascites, no appreciable mass Extremities: No significant cyanosis- extensive edema erythema of left foot and leg with denuded skin in upper leg due to large bleb which ruptured yesterday.   Data Reviewed: Basic Metabolic Panel:  Recent Labs Lab 04/06/14 0947 04/09/14 0047 04/09/14 0524  NA 139 139 139  K 3.8 4.1 3.7  CL 106 102 103  CO2  --  25 24  GLUCOSE 120* 119* 122*  BUN 12 9 7   CREATININE 0.70 0.86 0.72  CALCIUM  --  9.0 8.5   Liver Function Tests:  Recent Labs Lab 04/09/14 0047  AST 27  ALT 30  ALKPHOS 65  BILITOT 0.4  PROT 7.7  ALBUMIN 2.9*   No results found for this basename: LIPASE, AMYLASE,  in the last 168 hours No results found for this basename: AMMONIA,  in the last 168  hours CBC:  Recent Labs Lab 04/06/14 0915 04/06/14 0947 04/09/14 0047 04/09/14 0524  WBC 19.1*  --  11.2* 10.1  NEUTROABS 17.2*  --  9.1* 7.7  HGB 13.7 15.3 13.4 13.4  HCT 40.1 45.0 39.4 39.0  MCV 87.7  --  88.9 87.1  PLT 124*  --  132* 117*   Cardiac Enzymes: No results found for this basename: CKTOTAL, CKMB, CKMBINDEX, TROPONINI,  in the last 168 hours BNP (last 3 results)  Recent Labs  11/07/13 1947  PROBNP 74.4   CBG: No results found for this basename: GLUCAP,  in the last 168 hours  Recent Results (from the past 240 hour(s))  BODY FLUID CULTURE     Status: None   Collection Time    04/09/14  4:00 AM      Result Value Ref Range Status   Specimen Description FLUID LEFT LOWER LEG   Final   Special Requests NONE   Final   Gram Stain     Final   Value: RARE WBC PRESENT, PREDOMINANTLY PMN     NO ORGANISMS SEEN     Performed at [facility]     Performed at [facility]   Culture     Final   Value: NO GROWTH 2 DAYS     Performed at [facility]   Report Status PENDING   Incomplete  GRAM STAIN     Status: None   Collection Time    04/09/14  4:00 AM      Result Value Ref Range Status   Specimen Description FLUID LEFT LOWER LEG   Final   Special Requests NONE   Final   Gram Stain     Final   Value: RARE WBC PRESENT, PREDOMINANTLY PMN     NO ORGANISMS SEEN   Report Status 04/09/2014 FINAL   Final  CULTURE, BLOOD (ROUTINE X 2)     Status: None   Collection Time    04/09/14  5:24 AM      Result Value Ref Range Status   Specimen Description BLOOD LEFT ANTECUBITAL   Final   Special Requests     Final   Value: BOTTLES DRAWN AEROBIC AND ANAEROBIC 10CC BLUE  7CC RED   Culture  Setup Time     Final   Value: 04/09/2014 10:07     Performed at [facility]   Culture     Final   Value:        BLOOD CULTURE RECEIVED NO GROWTH  TO DATE CULTURE WILL BE HELD FOR 5 DAYS BEFORE ISSUING A FINAL NEGATIVE REPORT     Performed at [facility]   Report Status PENDING   Incomplete  CULTURE, BLOOD (ROUTINE X 2)     Status: None   Collection Time    04/09/14  5:42 AM      Result Value Ref Range Status   Specimen Description BLOOD LEFT HAND   Final   Special Requests BOTTLES DRAWN AEROBIC ONLY 4CC   Final   Culture  Setup Time     Final   Value: 04/09/2014 10:07     Performed at [facility]   Culture     Final   Value:        BLOOD CULTURE RECEIVED NO GROWTH TO DATE CULTURE WILL BE HELD FOR 5 DAYS BEFORE ISSUING A FINAL NEGATIVE REPORT     Performed at [facility]   Report Status PENDING   Incomplete     Studies:  Recent x-ray studies have been reviewed in detail by the Attending Physician  Scheduled Meds:  Scheduled Meds: . heparin  5,000 Units Subcutaneous 3 times per day  . lisinopril  20 mg Oral Daily  . piperacillin-tazobactam (ZOSYN)  IV  3.375 g Intravenous 3 times per day  . vancomycin  1,500 mg Intravenous Q12H   Continuous Infusions:    Time spent on care of this patient: 35 min   Yareth Kearse, MD 04/11/2014, 5:57 PM  LOS: 2 days   [facility] Office  [phone] Pager - Text Page per [email or web address]  If 7PM-7AM, please contact night-coverage [email or web address]

## 2014-04-12 ENCOUNTER — Inpatient Hospital Stay (HOSPITAL_COMMUNITY): Payer: Self-pay

## 2014-04-12 DIAGNOSIS — Z6841 Body Mass Index (BMI) 40.0 and over, adult: Secondary | ICD-10-CM

## 2014-04-12 LAB — BODY FLUID CULTURE: CULTURE: NO GROWTH

## 2014-04-12 LAB — CBC
HEMATOCRIT: 36.1 % — AB (ref 39.0–52.0)
HEMOGLOBIN: 12.2 g/dL — AB (ref 13.0–17.0)
MCH: 29.6 pg (ref 26.0–34.0)
MCHC: 33.8 g/dL (ref 30.0–36.0)
MCV: 87.6 fL (ref 78.0–100.0)
PLATELETS: 215 10*3/uL (ref 150–400)
RBC: 4.12 MIL/uL — ABNORMAL LOW (ref 4.22–5.81)
RDW: 13 % (ref 11.5–15.5)
WBC: 11.1 10*3/uL — AB (ref 4.0–10.5)

## 2014-04-12 LAB — BASIC METABOLIC PANEL
ANION GAP: 10 (ref 5–15)
BUN: 6 mg/dL (ref 6–23)
CO2: 27 mEq/L (ref 19–32)
Calcium: 8.9 mg/dL (ref 8.4–10.5)
Chloride: 104 mEq/L (ref 96–112)
Creatinine, Ser: 0.91 mg/dL (ref 0.50–1.35)
GFR calc Af Amer: >90 mL/min (ref >90)
GFR calc non Af Amer: >90 mL/min (ref >90)
Glucose, Bld: 111 mg/dL — ABNORMAL HIGH (ref 70–99)
Potassium: 3.8 mEq/L (ref 3.7–5.3)
Sodium: 141 mEq/L (ref 137–147)

## 2014-04-12 LAB — URINALYSIS, ROUTINE W REFLEX MICROSCOPIC
BILIRUBIN URINE: NEGATIVE
Glucose, UA: NEGATIVE mg/dL
Hgb urine dipstick: NEGATIVE
Ketones, ur: 15 mg/dL — AB
Leukocytes, UA: NEGATIVE
NITRITE: NEGATIVE
Protein, ur: NEGATIVE mg/dL
Specific Gravity, Urine: 1.009 (ref 1.005–1.030)
Urobilinogen, UA: 1 mg/dL (ref 0.0–1.0)
pH: 7.5 (ref 5.0–8.0)

## 2014-04-12 LAB — VANCOMYCIN, TROUGH: Vancomycin Tr: 9 ug/mL — ABNORMAL LOW (ref 10.0–20.0)

## 2014-04-12 MED ORDER — IBUPROFEN 400 MG PO TABS
400.0000 mg | ORAL_TABLET | ORAL | Status: DC | PRN
  Administered 2014-04-13 – 2014-04-14 (×2): 400 mg via ORAL
  Filled 2014-04-12 (×3): qty 1

## 2014-04-12 MED ORDER — CEFAZOLIN SODIUM-DEXTROSE 2-3 GM-% IV SOLR
2.0000 g | Freq: Three times a day (TID) | INTRAVENOUS | Status: DC
  Administered 2014-04-12 – 2014-04-15 (×10): 2 g via INTRAVENOUS
  Filled 2014-04-12 (×12): qty 50

## 2014-04-12 NOTE — Consult Note (Signed)
[facility]     Reason for Consult:leg infection with persistent fever    Referring Physician: Dr. Blake DivineAkula  Principal Problem:   Cellulitis of left leg Active Problems:   Hypertension   Sepsis   Morbid obesity   . heparin  5,000 Units Subcutaneous 3 times per day  . lisinopril  20 mg Oral Daily    Recommendations: Change antibiotics to cefazolin  Keep leg elevated above heart Will d/c vancomycin, more consistent with Strep Will d/c zosyn, no indication for zosyn outside of severe cellulitis.   Routine screening for HIV and hepatitis C  Thanks for consult, will continue to follow  Assessment: He has erysipelas of left lower leg as evidenced by raised erythema, well-demarcated, fever, chills.  Chills have resolved and has only had a fever once since admission.  If he continues to improve, he can be discharged on amoxicillin or keflex.    Antibiotics: Vancomycin zosyn  HPI: [name] is a 53 y.o. male with some sob and fever c/w viral etiology then developed leg swelling and redness.  Came to ED 9/29 and noted warmth, erythema of left leg.  Now with weeping.  Erythema has not progressed past initial markings.  Patient has had a fever to 101.7 yesterday.  Did have chills but this has resolved.  WBC miminally elevated, lactate normal.     Review of Systems: A comprehensive review of systems was negative.  Past Medical History  Diagnosis Date  . Hypertension   . Skin infection   . Obesity   . Bronchitis   . Cellulitis and abscess of lower extremity 04/11/2015    History  Substance Use Topics  . Smoking status: Never Smoker   . Smokeless tobacco: Never Used  . Alcohol Use: No    Family History  Problem Relation Age of Onset  . Diabetes Mother   . Alcohol abuse Father   . Hypertension Brother   . Asthma Sister    Allergies  Allergen Reactions  . Oxycodone Shortness Of Breath    OBJECTIVE: Blood pressure 131/80, pulse 90,  temperature 99.4 F (37.4 C), temperature source Oral, resp. rate 18, height 5\' 10"  (1.778 m), weight 349 lb 10.4 oz (158.6 kg), SpO2 99.00%. General: awake, alert, nad Skin: left leg with raised erythema, well demarcated, some tenderness and warmth, weeping Lungs: CTA B Cor: RRR  Abdomen:obese, soft, nt   Microbiology: Recent Results (from the past 240 hour(s))  BODY FLUID CULTURE     Status: None   Collection Time    04/09/14  4:00 AM      Result Value Ref Range Status   Specimen Description FLUID LEFT LOWER LEG   Final   Special Requests NONE   Final   Gram Stain     Final   Value: RARE WBC PRESENT, PREDOMINANTLY PMN     NO ORGANISMS SEEN     [facility]     [facility]formed at [facility]   Culture     Final   Value: NO GROWTH 3 DAYS     [facility]formed at [facility]   Report Status 04/12/2014 FINAL   Final  GRAM STAIN     Status: None   Collection Time    04/09/14  4:00 AM      Result Value Ref Range Status   Specimen Description FLUID LEFT LOWER LEG   Final   Special Requests NONE   Final  Gram Stain     Final   Value: RARE WBC PRESENT, PREDOMINANTLY PMN     NO ORGANISMS SEEN   Report Status 04/09/2014 FINAL   Final  CULTURE, BLOOD (ROUTINE X 2)     Status: None   Collection Time    04/09/14  5:24 AM      Result Value Ref Range Status   Specimen Description BLOOD LEFT ANTECUBITAL   Final   Special Requests     Final   Value: BOTTLES DRAWN AEROBIC AND ANAEROBIC 10CC BLUE  7CC RED   Culture  Setup Time     Final   Value: 04/09/2014 10:07     Performed at [facility]   Culture     Final   Value:        BLOOD CULTURE RECEIVED NO GROWTH TO DATE CULTURE WILL BE HELD FOR 5 DAYS BEFORE ISSUING A FINAL NEGATIVE REPORT     Performed at [facility]   Report Status PENDING   Incomplete  CULTURE, BLOOD (ROUTINE X 2)     Status: None   Collection Time    04/09/14  5:42 AM      Result Value Ref Range Status   Specimen  Description BLOOD LEFT HAND   Final   Special Requests BOTTLES DRAWN AEROBIC ONLY 4CC   Final   Culture  Setup Time     Final   Value: 04/09/2014 10:07     Performed at [facility]   Culture     Final   Value:        BLOOD CULTURE RECEIVED NO GROWTH TO DATE CULTURE WILL BE HELD FOR 5 DAYS BEFORE ISSUING A FINAL NEGATIVE REPORT     Performed at [facility]   Report Status PENDING   Incomplete    COMER, Molly Maduro, MD [facility] [email or web address] [phone] pager  [phone] cell 04/12/2014, 4:16 PM

## 2014-04-12 NOTE — ED Provider Notes (Signed)
Medical screening examination/treatment/procedure(s) were conducted as a shared visit with non-physician practitioner(s) and myself.  I personally evaluated the patient during the encounter.   EKG Interpretation None      Patient presents with generalized legs, fever, shortness of breath, and headache. Noted to be febrile to 103. Denies any neck pain or stiffness. Has had symptoms for several days. Cough. Basic labwork obtained.  Lactate normal. No significant leukocytosis. No evidence of sepsis. Chest x-ray without pneumonia. On my evaluation, patient is nonfocal. No nuchal rigidity. He is more comfortable following fever treatment. He endorses generalized bodyaches and viral symptoms. Fever could be secondary to headache. Low suspicion at this time given the history and physical exam for meningitis. Discussed workup and evaluation with the patient. Given that he is improved, he would like to be discharged home. He stated understanding regarding followup instructions. He will return if he has any new worsening symptoms.    Shon Batonourtney F Ayoub Arey, MD 04/13/14 [phone]

## 2014-04-12 NOTE — ED Provider Notes (Signed)
Medical screening examination/treatment/procedure(s) were conducted as a shared visit with non-physician practitioner(s) and myself.  I personally evaluated the patient during the encounter.   EKG Interpretation   Date/Time:  Friday April 06 2014 08:23:47 EDT Ventricular Rate:  117 PR Interval:  136 QRS Duration: 84 QT Interval:  310 QTC Calculation: 432 R Axis:   16 Text Interpretation:  Sinus tachycardia Moderate voltage criteria for LVH,  may be normal variant Nonspecific T wave abnormality Abnormal ECG  Tachycardia when compared to prior Confirmed by Artemus Romanoff  MD, Jenaveve Fenstermaker  [id number]) on 04/06/2014 8:31:23 AM     See additional note for details.   Shon Batonourtney F Eben Choinski, MD 04/12/14 [phone]

## 2014-04-12 NOTE — Progress Notes (Addendum)
ANTIBIOTIC CONSULT NOTE - FOLLOW UP  Pharmacy Consult for vancomycin and Zosyn --> to [facility] Indication: cellulitis  Allergies  Allergen Reactions  . Oxycodone Shortness Of Breath    Patient Measurements: Height: 5\' 10"  (177.8 cm) Weight: 349 lb 10.4 oz (158.6 kg) IBW/kg (Calculated) : 73  Vital Signs: Temp: 98.8 F (37.1 C) (10/01 0538) Temp src: Oral (10/01 0538) BP: 131/83 mmHg (10/01 1000) Pulse Rate: 86 (10/01 0538) Intake/Output from previous day: 09/30 0701 - 10/01 0700 In: 790 [P.O.:240; IV Piggyback:550] Out: 2300 [Urine:2300] Intake/Output from this shift: Total I/O In: 360 [P.O.:360] Out: -   Labs: No results found for this basename: WBC, HGB, PLT, LABCREA, CREATININE,  in the last 72 hours Estimated Creatinine Clearance: 165.6 ml/min (by C-G formula based on Cr of 0.72). No results found for this basename: VANCOTROUGH, VANCOPEAK, VANCORANDOM, GENTTROUGH, GENTPEAK, GENTRANDOM, TOBRATROUGH, TOBRAPEAK, TOBRARND, AMIKACINPEAK, AMIKACINTROU, AMIKACIN,  in the last 72 hours   Microbiology: Recent Results (from the past 720 hour(s))  BODY FLUID CULTURE     Status: None   Collection Time    04/09/14  4:00 AM      Result Value Ref Range Status   Specimen Description FLUID LEFT LOWER LEG   Final   Special Requests NONE   Final   Gram Stain     Final   Value: RARE WBC PRESENT, PREDOMINANTLY PMN     NO ORGANISMS SEEN     [facility]     [facility]formed at [facility]   Culture     Final   Value: NO GROWTH 3 DAYS     Performed at [facility]   Report Status 04/12/2014 FINAL   Final  GRAM STAIN     Status: None   Collection Time    04/09/14  4:00 AM      Result Value Ref Range Status   Specimen Description FLUID LEFT LOWER LEG   Final   Special Requests NONE   Final   Gram Stain     Final   Value: RARE WBC PRESENT, PREDOMINANTLY PMN     NO ORGANISMS SEEN   Report Status 04/09/2014 FINAL   Final  CULTURE, BLOOD (ROUTINE X 2)      Status: None   Collection Time    04/09/14  5:24 AM      Result Value Ref Range Status   Specimen Description BLOOD LEFT ANTECUBITAL   Final   Special Requests     Final   Value: BOTTLES DRAWN AEROBIC AND ANAEROBIC 10CC BLUE  7CC RED   Culture  Setup Time     Final   Value: 04/09/2014 10:07     Performed at [facility]   Culture     Final   Value:        BLOOD CULTURE RECEIVED NO GROWTH TO DATE CULTURE WILL BE HELD FOR 5 DAYS BEFORE ISSUING A FINAL NEGATIVE REPORT     Performed at [facility]   Report Status PENDING   Incomplete  CULTURE, BLOOD (ROUTINE X 2)     Status: None   Collection Time    04/09/14  5:42 AM      Result Value Ref Range Status   Specimen Description BLOOD LEFT HAND   Final   Special Requests BOTTLES DRAWN AEROBIC ONLY 4CC   Final   Culture  Setup Time     Final   Value: 04/09/2014 10:07     Performed at  [facility]     Final   Value:        BLOOD CULTURE RECEIVED NO GROWTH TO DATE CULTURE WILL BE HELD FOR 5 DAYS BEFORE ISSUING A FINAL NEGATIVE REPORT     Performed at [facility]   Report Status PENDING   Incomplete    Anti-infectives   Start     Dose/Rate Route Frequency Ordered Stop   04/09/14 1600  vancomycin (VANCOCIN) 1,500 mg in sodium chloride 0.9 % 500 mL IVPB     1,500 mg 250 mL/hr over 120 Minutes Intravenous Every 12 hours 04/09/14 0457     04/09/14 1300  piperacillin-tazobactam (ZOSYN) IVPB 3.375 g     3.375 g 12.5 mL/hr over 240 Minutes Intravenous 3 times per day 04/09/14 0457     04/09/14 0345  piperacillin-tazobactam (ZOSYN) IVPB 3.375 g     3.375 g 100 mL/hr over 30 Minutes Intravenous  Once 04/09/14 0334 04/09/14 0628   04/09/14 0345  vancomycin (VANCOCIN) IVPB 1000 mg/200 mL premix  Status:  Discontinued     1,000 mg 200 mL/hr over 60 Minutes Intravenous  Once 04/09/14 0334 04/09/14 0338   04/09/14 0345  vancomycin (VANCOCIN) 2,500 mg in sodium chloride 0.9 % 500 mL IVPB     2,500  mg 250 mL/hr over 120 Minutes Intravenous  Once 04/09/14 0339 04/09/14 0601   04/09/14 0215  clindamycin (CLEOCIN) IVPB 600 mg     600 mg 100 mL/hr over 30 Minutes Intravenous  Once 04/09/14 0210 04/09/14 0259      Assessment: 53 y/o male on day 4 vancomycin and Zosyn for LLE cellulitis s/p I&D of large bleb in the ED. Patient spiked a fever last night but has been afebrile today. Renal function was normal on 9/28, UOP 0.6 ml/kg/hr. Culture data is ngtd.  Goal of Therapy:  Vancomycin trough level 10-15 mcg/ml  Plan:  - Continue vancomycin 1500 mg IV q12h - Trough this afternoon due to fever and weight - Continue Zosyn 3.375 g IV q8h to be infused over 4 hours - F/U BMET ordered for today  [employer], Pharm.D., BCPS Clinical Pharmacist Pager: [phone] 04/12/2014 1:12 PM  PM  Antibiotics streamlined to Ancef only Weight - 158 kg  Plan-> Ancef 2 grams iv Q 8 hours  Thank you. Okey Regal, PharmD [phone]

## 2014-04-12 NOTE — Progress Notes (Signed)
[facility] Progress Note   [name]:[id number] DOB: [birth year] DOA: 04/09/2014 PCP: No PCP Per Patient  Brief narrative: [name] is a 53 y.o. male admitted for left leg swelling and redness. He began to have fevers a couple of days prior these changes in his legs and came to the rER on 9/25- D Dimer was elevated, CTA chest negative for PE. He was sent home with PRN Tylenol for fevers. On 9/26, he noted the changes in his legs and returned to the ER on 9/28 for admission for cellulitis.    Subjective: Leg feels about the same  Assessment/Plan: Principal Problem:   Cellulitis of left leg--  Sepsis - severe edema  and erythema- may be erysipelas which in the leg, due to damage from toxins,  can take  extensive amount of time to resolve -  cont Vanc and Zosyn- f/u blood cultures which are thus far negative - elevate leg as much as possible - venous Duplex for DVT neg   Active Problems:   Hypertension - resume Lisinoprl  Thrombocytopenia - from sepsis- cont to follow- cont Heparin for now as we are unable to use SCDs  FEVER: So far cultures are negative. CXR, UA, and labs ordered today. ID consulted for further recommendations. Resume IV antibiotics.      Code Status: full code Family Communication: none Disposition Plan: home DVT prophylaxis: heparin  Consultants: ID  Procedures: none  Antibiotics: Anti-infectives   Start     Dose/Rate Route Frequency Ordered Stop   04/09/14 1600  vancomycin (VANCOCIN) 1,500 mg in sodium chloride 0.9 % 500 mL IVPB     1,500 mg 250 mL/hr over 120 Minutes Intravenous Every 12 hours 04/09/14 0457     04/09/14 1300  piperacillin-tazobactam (ZOSYN) IVPB 3.375 g     3.375 g 12.5 mL/hr over 240 Minutes Intravenous 3 times per day 04/09/14 0457     04/09/14 0345  piperacillin-tazobactam (ZOSYN) IVPB 3.375 g     3.375 g 100 mL/hr over 30 Minutes Intravenous  Once 04/09/14 0334 04/09/14 0628   04/09/14 0345  vancomycin  (VANCOCIN) IVPB 1000 mg/200 mL premix  Status:  Discontinued     1,000 mg 200 mL/hr over 60 Minutes Intravenous  Once 04/09/14 0334 04/09/14 0338   04/09/14 0345  vancomycin (VANCOCIN) 2,500 mg in sodium chloride 0.9 % 500 mL IVPB     2,500 mg 250 mL/hr over 120 Minutes Intravenous  Once 04/09/14 0339 04/09/14 0601   04/09/14 0215  clindamycin (CLEOCIN) IVPB 600 mg     600 mg 100 mL/hr over 30 Minutes Intravenous  Once 04/09/14 0210 04/09/14 0259         Objective: Filed Weights   04/09/14 0034 04/09/14 0424  Weight: 165.109 kg (364 lb) 158.6 kg (349 lb 10.4 oz)    Intake/Output Summary (Last 24 hours) at 04/12/14 1514 Last data filed at 04/12/14 1011  Gross per 24 hour  Intake   1100 ml  Output    650 ml  Net    450 ml     Vitals Filed Vitals:   04/11/14 2240 04/12/14 0538 04/12/14 1000 04/12/14 1338  BP:  152/85 131/83 131/80  Pulse:  86  90  Temp: 99.8 F (37.7 C) 98.8 F (37.1 C)  99.4 F (37.4 C)  TempSrc:  Oral  Oral  Resp:  20  18  Height:      Weight:      SpO2:  96%  99%  Exam: General: No acute respiratory distress Lungs: Clear to auscultation bilaterally without wheezes or crackles Cardiovascular: Regular rate and rhythm without murmur gallop or rub normal S1 and S2 Abdomen: Nontender, nondistended, soft, bowel sounds positive, no rebound, no ascites, no appreciable mass Extremities: No significant cyanosis- extensive edema erythema of left foot and leg with denuded skin in upper leg due to large bleb which ruptured yesterday.   Data Reviewed: Basic Metabolic Panel:  Recent Labs Lab 04/06/14 0947 04/09/14 0047 04/09/14 0524  NA 139 139 139  K 3.8 4.1 3.7  CL 106 102 103  CO2  --  25 24  GLUCOSE 120* 119* 122*  BUN 12 9 7   CREATININE 0.70 0.86 0.72  CALCIUM  --  9.0 8.5   Liver Function Tests:  Recent Labs Lab 04/09/14 0047  AST 27  ALT 30  ALKPHOS 65  BILITOT 0.4  PROT 7.7  ALBUMIN 2.9*   No results found for this basename:  LIPASE, AMYLASE,  in the last 168 hours No results found for this basename: AMMONIA,  in the last 168 hours CBC:  Recent Labs Lab 04/06/14 0915 04/06/14 0947 04/09/14 0047 04/09/14 0524  WBC 19.1*  --  11.2* 10.1  NEUTROABS 17.2*  --  9.1* 7.7  HGB 13.7 15.3 13.4 13.4  HCT 40.1 45.0 39.4 39.0  MCV 87.7  --  88.9 87.1  PLT 124*  --  132* 117*   Cardiac Enzymes: No results found for this basename: CKTOTAL, CKMB, CKMBINDEX, TROPONINI,  in the last 168 hours BNP (last 3 results)  Recent Labs  11/07/13 1947  PROBNP 74.4   CBG: No results found for this basename: GLUCAP,  in the last 168 hours  Recent Results (from the past 240 hour(s))  BODY FLUID CULTURE     Status: None   Collection Time    04/09/14  4:00 AM      Result Value Ref Range Status   Specimen Description FLUID LEFT LOWER LEG   Final   Special Requests NONE   Final   Gram Stain     Final   Value: RARE WBC PRESENT, PREDOMINANTLY PMN     NO ORGANISMS SEEN     Performed at [facility]     Performed at [facility]   Culture     Final   Value: NO GROWTH 3 DAYS     Performed at [facility]   Report Status 04/12/2014 FINAL   Final  GRAM STAIN     Status: None   Collection Time    04/09/14  4:00 AM      Result Value Ref Range Status   Specimen Description FLUID LEFT LOWER LEG   Final   Special Requests NONE   Final   Gram Stain     Final   Value: RARE WBC PRESENT, PREDOMINANTLY PMN     NO ORGANISMS SEEN   Report Status 04/09/2014 FINAL   Final  CULTURE, BLOOD (ROUTINE X 2)     Status: None   Collection Time    04/09/14  5:24 AM      Result Value Ref Range Status   Specimen Description BLOOD LEFT ANTECUBITAL   Final   Special Requests     Final   Value: BOTTLES DRAWN AEROBIC AND ANAEROBIC 10CC BLUE  7CC RED   Culture  Setup Time     Final   Value: 04/09/2014 10:07     Performed at [facility]  Culture     Final   Value:        BLOOD CULTURE RECEIVED NO GROWTH TO DATE  CULTURE WILL BE HELD FOR 5 DAYS BEFORE ISSUING A FINAL NEGATIVE REPORT     [facility]   Report Status PENDING   Incomplete  CULTURE, BLOOD (ROUTINE X 2)     Status: None   Collection Time    04/09/14  5:42 AM      Result Value Ref Range Status   Specimen Description BLOOD LEFT HAND   Final   Special Requests BOTTLES DRAWN AEROBIC ONLY 4CC   Final   Culture  Setup Time     Final   Value: 04/09/2014 10:07     [facility]formed at [facility]   Culture     Final   Value:        BLOOD CULTURE RECEIVED NO GROWTH TO DATE CULTURE WILL BE HELD FOR 5 DAYS BEFORE ISSUING A FINAL NEGATIVE REPORT     [facility]formed [facility]   Report Status PENDING   Incomplete     Studies:  Recent x-ray studies have been reviewed in detail by the Attending Physician  Scheduled Meds:  Scheduled Meds: . heparin  5,000 Units Subcutaneous 3 times per day  . lisinopril  20 mg Oral Daily  . piperacillin-tazobactam (ZOSYN)  IV  3.375 g Intravenous 3 times per day  . vancomycin  1,500 mg Intravenous Q12H   Continuous Infusions:    Time spent on care of this patient: 35 min   Roddie Riegler, MD 04/12/2014, 3:14 PM  LOS: 3 days   [facility] Office  [phone] Pager - Text Page per [email or web address]  If 7PM-7AM, please contact night-coverage [email or web address]

## 2014-04-13 DIAGNOSIS — L03116 Cellulitis of left lower limb: Secondary | ICD-10-CM

## 2014-04-13 DIAGNOSIS — A46 Erysipelas: Secondary | ICD-10-CM

## 2014-04-13 DIAGNOSIS — I10 Essential (primary) hypertension: Secondary | ICD-10-CM

## 2014-04-13 LAB — HEPATITIS C ANTIBODY (REFLEX): HCV Ab: NEGATIVE

## 2014-04-13 LAB — HIV ANTIBODY (ROUTINE TESTING W REFLEX): HIV: NONREACTIVE

## 2014-04-13 MED ORDER — AMLODIPINE BESYLATE 5 MG PO TABS
5.0000 mg | ORAL_TABLET | Freq: Every day | ORAL | Status: DC
  Administered 2014-04-13 – 2014-04-15 (×3): 5 mg via ORAL
  Filled 2014-04-13 (×3): qty 1

## 2014-04-13 NOTE — Progress Notes (Signed)
ANTIBIOTIC CONSULT NOTE - FOLLOW UP  53 y/o male on day 2 cefazolin (5 antibiotics) for LLE cellulitis s/p I&D of large bleb in the ED. ID consulted and [facility]. Renal function is stable.   Continue cefazolin 2 g IV q8h for now Pharmacy signing off Please re-consult if needed  [facility] Surgery CenterJennifer Poynette, NavarroPharm.D., BCPS Clinical Pharmacist Pager: [phone] 04/13/2014 1:12 PM

## 2014-04-13 NOTE — Progress Notes (Signed)
[facility] Progress Note   RUE TINNEL ZOX:[id number] DOB: 06-03-1961 DOA: 04/09/2014 PCP: No PCP Per Patient  Brief narrative: [name] is a 53 y.o. male admitted for left leg swelling and redness. He began to have fevers a couple of days prior these changes in his legs and came to the rER on 9/25- D Dimer was elevated, CTA chest negative for PE. He was sent home with PRN Tylenol for fevers. On 9/26, he noted the changes in his legs and returned to the ER on 9/28 for admission for cellulitis.    Subjective: Leg feels about the same  Assessment/Plan: Principal Problem:   Cellulitis of left leg--  Sepsis - severe edema  and erythema- may be erysipelas which in the leg, due to damage from toxins,  can take  extensive amount of time to resolve -  Antibiotics changed to cefazolin by ID.  f/u blood cultures which are thus far negative - elevate leg as much as possible - venous Duplex for DVT neg   Active Problems:   Hypertension - resume Lisinoprl  Thrombocytopenia - from sepsis- cont to follow- cont Heparin for now as we are unable to use SCDs  FEVER: So far cultures are negative. CXR, UA, and labs ordered today. ID consulted for further recommendations. Resume IV antibiotics.      Code Status: full code Family Communication: none Disposition Plan: home DVT prophylaxis: heparin  Consultants: ID  Procedures: none  Antibiotics: Anti-infectives   Start     Dose/Rate Route Frequency Ordered Stop   04/12/14 1630  ceFAZolin (ANCEF) IVPB 2 g/50 mL premix     2 g 100 mL/hr over 30 Minutes Intravenous 3 times per day 04/12/14 1626     04/09/14 1600  vancomycin (VANCOCIN) 1,500 mg in sodium chloride 0.9 % 500 mL IVPB  Status:  Discontinued     1,500 mg 250 mL/hr over 120 Minutes Intravenous Every 12 hours 04/09/14 0457 04/12/14 1615   04/09/14 1300  piperacillin-tazobactam (ZOSYN) IVPB 3.375 g  Status:  Discontinued     3.375 g 12.5 mL/hr over 240 Minutes  Intravenous 3 times per day 04/09/14 0457 04/12/14 1615   04/09/14 0345  piperacillin-tazobactam (ZOSYN) IVPB 3.375 g     3.375 g 100 mL/hr over 30 Minutes Intravenous  Once 04/09/14 0334 04/09/14 0628   04/09/14 0345  vancomycin (VANCOCIN) IVPB 1000 mg/200 mL premix  Status:  Discontinued     1,000 mg 200 mL/hr over 60 Minutes Intravenous  Once 04/09/14 0334 04/09/14 0338   04/09/14 0345  vancomycin (VANCOCIN) 2,500 mg in sodium chloride 0.9 % 500 mL IVPB     2,500 mg 250 mL/hr over 120 Minutes Intravenous  Once 04/09/14 0339 04/09/14 0601   04/09/14 0215  clindamycin (CLEOCIN) IVPB 600 mg     600 mg 100 mL/hr over 30 Minutes Intravenous  Once 04/09/14 0210 04/09/14 0259         Objective: Filed Weights   04/09/14 0034 04/09/14 0424  Weight: 165.109 kg (364 lb) 158.6 kg (349 lb 10.4 oz)    Intake/Output Summary (Last 24 hours) at 04/13/14 1653 Last data filed at 04/13/14 1555  Gross per 24 hour  Intake    840 ml  Output   3250 ml  Net  -2410 ml     Vitals Filed Vitals:   04/12/14 2133 04/13/14 0510 04/13/14 1335 04/13/14 1600  BP: 127/63 159/79 174/88 177/71  Pulse: 87 96 81 92  Temp: 99 F (37.2  C) 99.6 F (37.6 C) 98.4 F (36.9 C) 99.7 F (37.6 C)  TempSrc: Oral Oral Oral Oral  Resp: 18 18 20 18   Height:      Weight:      SpO2: 98% 97% 98% 98%    Exam: General: No acute respiratory distress Lungs: Clear to auscultation bilaterally without wheezes or crackles Cardiovascular: Regular rate and rhythm without murmur gallop or rub normal S1 and S2 Abdomen: Nontender, nondistended, soft, bowel sounds positive, no rebound, no ascites, no appreciable mass Extremities: No significant cyanosis- extensive edema erythema of left foot and leg with denuded skin in upper leg due to large bleb which ruptured yesterday.   Data Reviewed: Basic Metabolic Panel:  Recent Labs Lab 04/09/14 0047 04/09/14 0524 04/12/14 1540  NA 139 139 141  K 4.1 3.7 3.8  CL 102 103 104   CO2 25 24 27   GLUCOSE 119* 122* 111*  BUN 9 7 6   CREATININE 0.86 0.72 0.91  CALCIUM 9.0 8.5 8.9   Liver Function Tests:  Recent Labs Lab 04/09/14 0047  AST 27  ALT 30  ALKPHOS 65  BILITOT 0.4  PROT 7.7  ALBUMIN 2.9*   No results found for this basename: LIPASE, AMYLASE,  in the last 168 hours No results found for this basename: AMMONIA,  in the last 168 hours CBC:  Recent Labs Lab 04/09/14 0047 04/09/14 0524 04/12/14 1540  WBC 11.2* 10.1 11.1*  NEUTROABS 9.1* 7.7  --   HGB 13.4 13.4 12.2*  HCT 39.4 39.0 36.1*  MCV 88.9 87.1 87.6  PLT 132* 117* 215   Cardiac Enzymes: No results found for this basename: CKTOTAL, CKMB, CKMBINDEX, TROPONINI,  in the last 168 hours BNP (last 3 results)  Recent Labs  11/07/13 1947  PROBNP 74.4   CBG: No results found for this basename: GLUCAP,  in the last 168 hours  Recent Results (from the past 240 hour(s))  BODY FLUID CULTURE     Status: None   Collection Time    04/09/14  4:00 AM      Result Value Ref Range Status   Specimen Description FLUID LEFT LOWER LEG   Final   Special Requests NONE   Final   Gram Stain     Final   Value: RARE WBC PRESENT, PREDOMINANTLY PMN     NO ORGANISMS SEEN     [facility] [place]     [facility]   Culture     Final   Value: NO GROWTH 3 DAYS     [facility]   Report Status 04/12/2014 FINAL   Final  GRAM STAIN     Status: None   Collection Time    04/09/14  4:00 AM      Result Value Ref Range Status   Specimen Description FLUID LEFT LOWER LEG   Final   Special Requests NONE   Final   Gram Stain     Final   Value: RARE WBC PRESENT, PREDOMINANTLY PMN     NO ORGANISMS SEEN   Report Status 04/09/2014 FINAL   Final  CULTURE, BLOOD (ROUTINE X 2)     Status: None   Collection Time    04/09/14  5:24 AM      Result Value Ref Range Status   Specimen Description BLOOD LEFT ANTECUBITAL   Final   Special Requests     Final   Value:  BOTTLES DRAWN AEROBIC AND ANAEROBIC 10CC BLUE  [facility] RED   Culture  Setup Time     Final   Value: 04/09/2014 10:07     Performed at [facility]   Culture     Final   Value:        BLOOD CULTURE RECEIVED NO GROWTH TO DATE CULTURE WILL BE HELD FOR 5 DAYS BEFORE ISSUING A FINAL NEGATIVE REPORT     Performed at [facility]   Report Status PENDING   Incomplete  CULTURE, BLOOD (ROUTINE X 2)     Status: None   Collection Time    04/09/14  5:42 AM      Result Value Ref Range Status   Specimen Description BLOOD LEFT HAND   Final   Special Requests BOTTLES DRAWN AEROBIC ONLY 4CC   Final   Culture  Setup Time     Final   Value: 04/09/2014 10:07     Performed at [facility]   Culture     Final   Value:        BLOOD CULTURE RECEIVED NO GROWTH TO DATE CULTURE WILL BE HELD FOR 5 DAYS BEFORE ISSUING A FINAL NEGATIVE REPORT     Performed at [facility]   Report Status PENDING   Incomplete     Studies:  Recent x-ray studies have been reviewed in detail by the Attending Physician  Scheduled Meds:  Scheduled Meds: .  ceFAZolin (ANCEF) IV  2 g Intravenous 3 times per day  . heparin  5,000 Units Subcutaneous 3 times per day  . lisinopril  20 mg Oral Daily   Continuous Infusions:    Time spent on care of this patient: 35 min   Efren Kross, MD 04/13/2014, 4:53 PM  LOS: 4 days   [facility] Office  [phone] Pager - Text Page per [email or web address]  If 7PM-7AM, please contact night-coverage [email or web address]

## 2014-04-13 NOTE — Care Management Note (Addendum)
    Page 1 of 1   04/15/2014     3:48:20 PM CARE MANAGEMENT NOTE 04/15/2014  Patient:  [name], [name]   Account Number:  [id number]  Date Initiated:  04/13/2014  Documentation initiated by:  Tomi Bamberger  Subjective/Objective Assessment:   dx left leg cellulitis  admit- lives with family.     Action/Plan:   Anticipated DC Date:  04/14/2014   Anticipated DC Plan:  [place]  CM consult      Choice offered to / List presented to:     DME arranged  Vassie Moselle      DME agency  [place].        Status of service:  Completed, signed off Medicare Important Message given?  NO (If response is "NO", the following Medicare IM given date fields will be blank) Date Medicare IM given:   Medicare IM given by:   Date Additional Medicare IM given:   Additional Medicare IM given by:    Discharge Disposition:  HOME/SELF CARE  Per UR Regulation:  Reviewed for med. necessity/level of care/duration of stay  If discussed at [place] of Stay Meetings, dates discussed:    Comments:  04/15/14 15:30 CM met with pt in room and gave pt [facility] pamphlet.  Pt verbalized understanding he is to go to the clinic any weekday morning between 9-10am and ask for: AN APPOINTMENT TO SECURE A PCP; AN APPOINTMENT WITH A NAVIGATOR FOR INSURANCE; AND AN APPOINTMENT FOR MEDICAL FOLLOW UP.  Pt refuses HHRn as he states he does not want to pay up front or sign any papers for payment.  MD to prescribe from $4 list.  Pt states his niece is in nursing school, and his Elenor Legato is a Marine scientist and will help him with his wound.  Pt verbzlied understanding of signs of infections; more redness, swelling, fever, headache, rapid heart rate, and overall malaise. CM called DME rep, Jeneen Rinks to reqquest correct size walker (349lbs); Jeneen Rinks states he will go and speak with pt. No other CM needs were communicated.  Mariane Masters, BSN, Cm [phone].

## 2014-04-13 NOTE — Progress Notes (Signed)
[facility]  Date of Admission:  04/09/2014  Antibiotics: Cefazolin day 2  Subjective: Feels better, afebrile  Objective: Temp:  [99 F (37.2 C)-99.6 F (37.6 C)] 99.6 F (37.6 C) (10/02 0510) Pulse Rate:  [87-96] 96 (10/02 0510) Resp:  [18] 18 (10/02 0510) BP: (127-159)/(63-80) 159/79 mmHg (10/02 0510) SpO2:  [97 %-99 %] 97 % (10/02 0510)  General: awake, nad Skin: leg with unchanged appearance of blistering, open wound, erythema, less warmth overall Lungs: CTA B Cor: RRR Abdomen: morbidly obese   Lab Results Lab Results  Component Value Date   WBC 11.1* 04/12/2014   HGB 12.2* 04/12/2014   HCT 36.1* 04/12/2014   MCV 87.6 04/12/2014   PLT 215 04/12/2014    Lab Results  Component Value Date   CREATININE 0.91 04/12/2014   BUN 6 04/12/2014   NA 141 04/12/2014   K 3.8 04/12/2014   CL 104 04/12/2014   CO2 27 04/12/2014    Lab Results  Component Value Date   ALT 30 04/09/2014   AST 27 04/09/2014   ALKPHOS 65 04/09/2014   BILITOT 0.4 04/09/2014      Microbiology: Recent Results (from the past 240 hour(s))  BODY FLUID CULTURE     Status: None   Collection Time    04/09/14  4:00 AM      Result Value Ref Range Status   Specimen Description FLUID LEFT LOWER LEG   Final   Special Requests NONE   Final   Gram Stain     Final   Value: RARE WBC PRESENT, PREDOMINANTLY PMN     NO ORGANISMS SEEN     Performed at [facility]     Performed at [facility]   Culture     Final   Value: NO GROWTH 3 DAYS     Performed at [facility]   Report Status 04/12/2014 FINAL   Final  GRAM STAIN     Status: None   Collection Time    04/09/14  4:00 AM      Result Value Ref Range Status   Specimen Description FLUID LEFT LOWER LEG   Final   Special Requests NONE   Final   Gram Stain     Final   Value: RARE WBC PRESENT, PREDOMINANTLY PMN     NO ORGANISMS SEEN   Report Status 04/09/2014 FINAL   Final  CULTURE, BLOOD (ROUTINE X 2)      Status: None   Collection Time    04/09/14  5:24 AM      Result Value Ref Range Status   Specimen Description BLOOD LEFT ANTECUBITAL   Final   Special Requests     Final   Value: BOTTLES DRAWN AEROBIC AND ANAEROBIC 10CC BLUE  7CC RED   Culture  Setup Time     Final   Value: 04/09/2014 10:07     Performed at [facility]   Culture     Final   Value:        BLOOD CULTURE RECEIVED NO GROWTH TO DATE CULTURE WILL BE HELD FOR 5 DAYS BEFORE ISSUING A FINAL NEGATIVE REPORT     Performed at [facility]   Report Status PENDING   Incomplete  CULTURE, BLOOD (ROUTINE X 2)     Status: None   Collection Time    04/09/14  5:42 AM      Result Value Ref Range Status   Specimen Description BLOOD  LEFT HAND   Final   Special Requests BOTTLES DRAWN AEROBIC ONLY 4CC   Final   Culture  Setup Time     Final   Value: 04/09/2014 10:07     [facility]formed at [facility]   Culture     Final   Value:        BLOOD CULTURE RECEIVED NO GROWTH TO DATE CULTURE WILL BE HELD FOR 5 DAYS BEFORE ISSUING A FINAL NEGATIVE REPORT     [facility]formed at [facility]   Report Status PENDING   Incomplete    Studies/Results: Dg Chest 2 View  04/12/2014   CLINICAL DATA:  Fever, LEFT lower extremity swelling for 5 days, personal history hypertension, bronchitis, obesity  EXAM: CHEST  2 VIEW  COMPARISON:  04/06/2014  FINDINGS: Normal heart size, mediastinal contours, and pulmonary vascularity.  Lungs clear.  No pleural effusion or pneumothorax.  Mild scattered endplate spur formation thoracic spine.  IMPRESSION: No acute abnormalities.   Electronically Signed   By: Ulyses SouthwardMark  Boles M.D.   On: 04/12/2014 15:05    Assessment/Plan: 1)  Left lower leg erysipelas - doing well on cefazolin.  Can change to Keflex 500 mg qid and would treat for about 2 more weeks and have his leg reevaluated by his PCP.   We are also available for follow up if needed. Thanks for consult  Staci RighterOMER, Shikira Folino, MD [facility] [email or web address] [phone] pager   [phone] cell 04/13/2014, 11:45 AM

## 2014-04-14 LAB — CBC
HCT: 36.7 % — ABNORMAL LOW (ref 39.0–52.0)
HEMOGLOBIN: 12.3 g/dL — AB (ref 13.0–17.0)
MCH: 29.4 pg (ref 26.0–34.0)
MCHC: 33.5 g/dL (ref 30.0–36.0)
MCV: 87.8 fL (ref 78.0–100.0)
Platelets: 254 10*3/uL (ref 150–400)
RBC: 4.18 MIL/uL — ABNORMAL LOW (ref 4.22–5.81)
RDW: 13 % (ref 11.5–15.5)
WBC: 10.2 10*3/uL (ref 4.0–10.5)

## 2014-04-14 LAB — BASIC METABOLIC PANEL
Anion gap: 10 (ref 5–15)
BUN: 6 mg/dL (ref 6–23)
CO2: 28 mEq/L (ref 19–32)
Calcium: 8.9 mg/dL (ref 8.4–10.5)
Chloride: 101 mEq/L (ref 96–112)
Creatinine, Ser: 0.76 mg/dL (ref 0.50–1.35)
Glucose, Bld: 123 mg/dL — ABNORMAL HIGH (ref 70–99)
POTASSIUM: 3.8 meq/L (ref 3.7–5.3)
SODIUM: 139 meq/L (ref 137–147)

## 2014-04-14 LAB — GLUCOSE, CAPILLARY
Glucose-Capillary: 113 mg/dL — ABNORMAL HIGH (ref 70–99)
Glucose-Capillary: 93 mg/dL (ref 70–99)

## 2014-04-14 MED ORDER — KETOROLAC TROMETHAMINE 15 MG/ML IJ SOLN
15.0000 mg | Freq: Four times a day (QID) | INTRAMUSCULAR | Status: DC | PRN
  Administered 2014-04-14 (×2): 15 mg via INTRAVENOUS
  Filled 2014-04-14 (×2): qty 1

## 2014-04-14 MED ORDER — INSULIN ASPART 100 UNIT/ML ~~LOC~~ SOLN: 0.0000 [IU] | Freq: Three times a day (TID) | SUBCUTANEOUS | Status: DC

## 2014-04-14 MED ORDER — MORPHINE SULFATE 2 MG/ML IJ SOLN
1.0000 mg | INTRAMUSCULAR | Status: DC | PRN
  Administered 2014-04-14 – 2014-04-15 (×2): 1 mg via INTRAVENOUS
  Filled 2014-04-14 (×2): qty 1

## 2014-04-14 NOTE — Progress Notes (Signed)
[facility] Progress Note   [name] ZOX:[id number] DOB: 12/27/1960 DOA: 04/09/2014 PCP: No PCP Per Patient  Brief narrative: [name] is a 53 y.o. male admitted for left leg swelling and redness. He began to have fevers a couple of days prior these changes in his legs and came to the rER on 9/25- D Dimer was elevated, CTA chest negative for PE. He was sent home with PRN Tylenol for fevers. On 9/26, he noted the changes in his legs and returned to the ER on 9/28 for admission for cellulitis.    Subjective: Pain is worse.   Assessment/Plan: Principal Problem:   Cellulitis of left leg--  Sepsis - severe edema  and erythema- may be erysipelas which in the leg, due to damage from toxins,  can take  extensive amount of time to resolve -  Antibiotics changed to cefazolin by ID.  f/u blood cultures which are thus far negative - elevate leg as much as possible - venous Duplex for DVT neg   Active Problems:   Hypertension - resume Lisinoprl  Thrombocytopenia - from sepsis- cont to follow- cont Heparin for now as we are unable to use SCDs  FEVER: So far cultures are negative. CXR, UA, and labs ordered today. ID consulted for further recommendations. Resume IV antibiotics.    Elevated cbgs: [id number] ordered and on SSI     Code Status: full code Family Communication: none Disposition Plan: home DVT prophylaxis: heparin  Consultants: ID  Procedures: none  Antibiotics: Anti-infectives   Start     Dose/Rate Route Frequency Ordered Stop   04/12/14 1630  ceFAZolin (ANCEF) IVPB 2 g/50 mL premix     2 g 100 mL/hr over 30 Minutes Intravenous 3 times per day 04/12/14 1626     04/09/14 1600  vancomycin (VANCOCIN) 1,500 mg in sodium chloride 0.9 % 500 mL IVPB  Status:  Discontinued     1,500 mg 250 mL/hr over 120 Minutes Intravenous Every 12 hours 04/09/14 0457 04/12/14 1615   04/09/14 1300  piperacillin-tazobactam (ZOSYN) IVPB 3.375 g  Status:  Discontinued     3.375  g 12.5 mL/hr over 240 Minutes Intravenous 3 times per day 04/09/14 0457 04/12/14 1615   04/09/14 0345  piperacillin-tazobactam (ZOSYN) IVPB 3.375 g     3.375 g 100 mL/hr over 30 Minutes Intravenous  Once 04/09/14 0334 04/09/14 0628   04/09/14 0345  vancomycin (VANCOCIN) IVPB 1000 mg/200 mL premix  Status:  Discontinued     1,000 mg 200 mL/hr over 60 Minutes Intravenous  Once 04/09/14 0334 04/09/14 0338   04/09/14 0345  vancomycin (VANCOCIN) 2,500 mg in sodium chloride 0.9 % 500 mL IVPB     2,500 mg 250 mL/hr over 120 Minutes Intravenous  Once 04/09/14 0339 04/09/14 0601   04/09/14 0215  clindamycin (CLEOCIN) IVPB 600 mg     600 mg 100 mL/hr over 30 Minutes Intravenous  Once 04/09/14 0210 04/09/14 0259         Objective: Filed Weights   04/09/14 0034 04/09/14 0424  Weight: 165.109 kg (364 lb) 158.6 kg (349 lb 10.4 oz)    Intake/Output Summary (Last 24 hours) at 04/14/14 1432 Last data filed at 04/14/14 0744  Gross per 24 hour  Intake    520 ml  Output    750 ml  Net   -230 ml     Vitals Filed Vitals:   04/13/14 1600 04/13/14 2116 04/14/14 0517 04/14/14 1422  BP: 177/71 142/72 151/80 137/73  Pulse: 92 87 85 84  Temp: 99.7 F (37.6 C) 98.8 F (37.1 C) 98.8 F (37.1 C) 98.4 F (36.9 C)  TempSrc: Oral Oral Oral Oral  Resp: 18  18 18   Height:      Weight:      SpO2: 98% 96% 96% 99%    Exam: General: No acute respiratory distress Lungs: Clear to auscultation bilaterally without wheezes or crackles Cardiovascular: Regular rate and rhythm without murmur gallop or rub normal S1 and S2 Abdomen: Nontender, nondistended, soft, bowel sounds positive, no rebound, no ascites, no appreciable mass Extremities: No significant cyanosis- extensive edema erythema of left foot and leg with denuded skin in upper leg due to large bleb which ruptured yesterday.   Data Reviewed: Basic Metabolic Panel:  Recent Labs Lab 04/09/14 0047 04/09/14 0524 04/12/14 1540 04/14/14 0610  NA  139 139 141 139  K 4.1 3.7 3.8 3.8  CL 102 103 104 101  CO2 25 24 27 28   GLUCOSE 119* 122* 111* 123*  BUN 9 7 6 6   CREATININE 0.86 0.72 0.91 0.76  CALCIUM 9.0 8.5 8.9 8.9   Liver Function Tests:  Recent Labs Lab 04/09/14 0047  AST 27  ALT 30  ALKPHOS 65  BILITOT 0.4  PROT 7.7  ALBUMIN 2.9*   No results found for this basename: LIPASE, AMYLASE,  in the last 168 hours No results found for this basename: AMMONIA,  in the last 168 hours CBC:  Recent Labs Lab 04/09/14 0047 04/09/14 0524 04/12/14 1540 04/14/14 0610  WBC 11.2* 10.1 11.1* 10.2  NEUTROABS 9.1* 7.7  --   --   HGB 13.4 13.4 12.2* 12.3*  HCT 39.4 39.0 36.1* 36.7*  MCV 88.9 87.1 87.6 87.8  PLT 132* 117* 215 254   Cardiac Enzymes: No results found for this basename: CKTOTAL, CKMB, CKMBINDEX, TROPONINI,  in the last 168 hours BNP (last 3 results)  Recent Labs  11/07/13 1947  PROBNP 74.4   CBG: No results found for this basename: GLUCAP,  in the last 168 hours  Recent Results (from the past 240 hour(s))  BODY FLUID CULTURE     Status: None   Collection Time    04/09/14  4:00 AM      Result Value Ref Range Status   Specimen Description FLUID LEFT LOWER LEG   Final   Special Requests NONE   Final   Gram Stain     Final   Value: RARE WBC PRESENT, PREDOMINANTLY PMN     NO ORGANISMS SEEN     [facility]      [facility]   Culture     Final   Value: NO GROWTH 3 DAYS     [facility]   Report Status 04/12/2014 FINAL   Final  GRAM STAIN     Status: None   Collection Time    04/09/14  4:00 AM      Result Value Ref Range Status   Specimen Description FLUID LEFT LOWER LEG   Final   Special Requests NONE   Final   Gram Stain     Final   Value: RARE WBC PRESENT, PREDOMINANTLY PMN     NO ORGANISMS SEEN   Report Status 04/09/2014 FINAL   Final  CULTURE, BLOOD (ROUTINE X 2)     Status: None   Collection Time    04/09/14  5:24 AM      Result  Value Ref Range  Status   Specimen Description BLOOD LEFT ANTECUBITAL   Final   Special Requests     Final   Value: BOTTLES DRAWN AEROBIC AND ANAEROBIC 10CC BLUE  7CC RED   Culture  Setup Time     Final   Value: 04/09/2014 10:07     Performed at [facility]   Culture     Final   Value:        BLOOD CULTURE RECEIVED NO GROWTH TO DATE CULTURE WILL BE HELD FOR 5 DAYS BEFORE ISSUING A FINAL NEGATIVE REPORT     Performed at [facility]   Report Status PENDING   Incomplete  CULTURE, BLOOD (ROUTINE X 2)     Status: None   Collection Time    04/09/14  5:42 AM      Result Value Ref Range Status   Specimen Description BLOOD LEFT HAND   Final   Special Requests BOTTLES DRAWN AEROBIC ONLY 4CC   Final   Culture  Setup Time     Final   Value: 04/09/2014 10:07     Performed at [facility]   Culture     Final   Value:        BLOOD CULTURE RECEIVED NO GROWTH TO DATE CULTURE WILL BE HELD FOR 5 DAYS BEFORE ISSUING A FINAL NEGATIVE REPORT     Performed at [facility]   Report Status PENDING   Incomplete     Studies:  Recent x-ray studies have been reviewed in detail by the Attending Physician  Scheduled Meds:  Scheduled Meds: . amLODipine  5 mg Oral Daily  .  ceFAZolin (ANCEF) IV  2 g Intravenous 3 times per day  . heparin  5,000 Units Subcutaneous 3 times per day  . insulin aspart  0-9 Units Subcutaneous TID WC  . lisinopril  20 mg Oral Daily   Continuous Infusions:    Time spent on care of this patient: 35 min   Deara Bober, MD 04/14/2014, 2:32 PM  LOS: 5 days   [facility] Office  [phone] Pager - Text Page per [email or web address]  If 7PM-7AM, please contact night-coverage [email or web address]

## 2014-04-14 NOTE — Progress Notes (Signed)
Pt ambulated in hallway with walker.  Appeared to tolerate well after receiving 1mg  morphine.  Will continue to monitor.

## 2014-04-15 LAB — CULTURE, BLOOD (ROUTINE X 2)
CULTURE: NO GROWTH
Culture: NO GROWTH

## 2014-04-15 LAB — HEMOGLOBIN A1C
HEMOGLOBIN A1C: 6 % — AB (ref <5.7)
MEAN PLASMA GLUCOSE: 126 mg/dL — AB (ref <117)

## 2014-04-15 LAB — GLUCOSE, CAPILLARY
GLUCOSE-CAPILLARY: 92 mg/dL (ref 70–99)
Glucose-Capillary: 104 mg/dL — ABNORMAL HIGH (ref 70–99)

## 2014-04-15 MED ORDER — AMLODIPINE BESYLATE 5 MG PO TABS: 5.0000 mg | ORAL_TABLET | Freq: Every day | ORAL | Status: DC

## 2014-04-15 MED ORDER — METFORMIN HCL 500 MG PO TABS: 500.0000 mg | ORAL_TABLET | Freq: Every day | ORAL | Status: DC

## 2014-04-15 MED ORDER — KETOROLAC TROMETHAMINE 10 MG PO TABS: 10.0000 mg | ORAL_TABLET | Freq: Four times a day (QID) | ORAL | Status: DC | PRN

## 2014-04-15 MED ORDER — CEPHALEXIN 500 MG PO CAPS: 500.0000 mg | ORAL_CAPSULE | Freq: Four times a day (QID) | ORAL | Status: DC

## 2014-04-15 NOTE — Progress Notes (Signed)
Pt given discharge instructions, prescriptions, and note for work.  Also gave supplies for dressing change until PCP established.  Pt denied any questions.  Taken to discharge location by wheelchair.

## 2014-04-15 NOTE — Discharge Instructions (Signed)
Blisters Blisters are fluid-filled sacs that form within the skin. Common causes of blistering are friction, burns, and exposure to irritating chemicals. The fluid in the blister protects the underlying damaged skin. Most of the time it is not recommended that you open blisters. When a blister is opened, there is an increased chance for infection. Usually, a blister will open on its own. They then dry up and peel off within 10 days. If the blister is tense and uncomfortable (painful) the fluid may be drained. If it is drained the roof of the blister should be left intact. The draining should only be done by a medical professional under aseptic conditions. Poorly fitting shoes and boots can cause blisters by being too tight or too loose. Wearing extra socks or using tape, bandages, or pads over the blister-prone area helps prevent the problem by reducing friction. Blisters heal more slowly if you have diabetes or if you have problems with your circulation. You need to be careful about medical follow-up to prevent infection. HOME CARE INSTRUCTIONS  Protect areas where blisters have formed until the skin is healed. Use a special bandage with a hole cut in the middle around the blister. This reduces pressure and friction. When the blister breaks, trim off the loose skin and keep the area clean by washing it with soap daily. Soaking the blister or broken-open blister with diluted vinegar twice daily for 15 minutes will dry it up and speed the healing. Use 3 tablespoons of white vinegar per quart of water (45 mL white vinegar per liter of water). An antibiotic ointment and a bandage can be used to cover the area after soaking.  SEEK MEDICAL CARE IF:   You develop increased redness, pain, swelling, or drainage in the blistered area.  You develop a pus-like discharge from the blistered area, chills, or a fever. MAKE SURE YOU:   Understand these instructions.  Will watch your condition.  Will get help right  away if you are not doing well or get worse. Document Released: 08/06/2004 Document Revised: 09/21/2011 Document Reviewed: 07/04/2008 [facility] Patient Information 2015 [place]. This information is not intended to replace advice given to you by your health care provider. Make sure you discuss any questions you have with your health care provider.   Cellulitis Cellulitis is an infection of the skin and the tissue under the skin. The infected area is usually red and tender. This happens most often in the arms and lower legs. HOME CARE   Take your antibiotic medicine as told. Finish the medicine even if you start to feel better.  Keep the infected arm or leg raised (elevated).  Put a warm cloth on the area up to 4 times per day.  Only take medicines as told by your doctor.  Keep all doctor visits as told. GET HELP IF:  You see red streaks on the skin coming from the infected area.  Your red area gets bigger or turns a dark color.  Your bone or joint under the infected area is painful after the skin heals.  Your infection comes back in the same area or different area.  You have a puffy (swollen) bump in the infected area.  You have new symptoms.  You have a fever. GET HELP RIGHT AWAY IF:   You feel very sleepy.  You throw up (vomit) or have watery poop (diarrhea).  You feel sick and have muscle aches and pains. MAKE SURE YOU:   Understand these instructions.  Will watch  your condition.  Will get help right away if you are not doing well or get worse. Document Released: 12/16/2007 Document Revised: 11/13/2013 Document Reviewed: 09/14/2011 [facility] Patient Information 2015 [place], [place]. This information is not intended to replace advice given to you by your health care provider. Make sure you discuss any questions you have with your health care provider.

## 2014-04-15 NOTE — Discharge Summary (Signed)
Physician Discharge Summary  [name] OZH:[id number] DOB: 08/28/1960 DOA: 04/09/2014  PCP: No PCP Per Patient  Admit date: 04/09/2014 Discharge date: 04/15/2014  Time spent: 25 min minutes  Recommendations for Outpatient Follow-up:  1. Follow up with PCP in tomorrow.  Discharge Diagnoses:  Principal Problem:   Cellulitis of left leg Active Problems:   Hypertension   Sepsis   Morbid obesity   Discharge Condition: improved.   Diet recommendation: low sodium diet   Filed Weights   04/09/14 0034 04/09/14 0424  Weight: 165.109 kg (364 lb) 158.6 kg (349 lb 10.4 oz)    History of present illness:  [name] is a 53 y.o. male admitted for left leg swelling and redness. He began to have fevers a couple of days prior these changes in his legs and came to the rER on 9/25- D Dimer was elevated, CTA chest negative for PE. He was sent home with PRN Tylenol for fevers. On 9/26, he noted the changes in his legs and returned to the ER on 9/28 for admission for cellulitis.   Hospital Course:   Cellulitis of left leg-- Sepsis  - severe edema and erythema- may be erysipelas which in the leg, due to damage from toxins, can take extensive amount of time to resolve  - Antibiotics changed to cefazolin by ID. f/u blood cultures which are thus far negative  - elevate leg as much as possible  - venous Duplex for DVT neg . Plan to discharge him on keflex to complete the course.  Active Problems:  Hypertension  - resume Lisinoprl  Thrombocytopenia  - from sepsis- improved.  FEVER:  So far cultures are negative. CXR, UA, and labs ordered today. ID consulted for further recommendations. Plan to complete the course of antibiotics .  Elevated cbgs: hgba1c  Is 6.   Procedures:  none  Consultations:  none  Discharge Exam: Filed Vitals:   04/15/14 1410  BP: 140/82  Pulse: 97  Temp: 99 F (37.2 C)  Resp: 16    General: alert afebrile comfortable. Cardiovascular:  s1s2 Respiratory:CTAB  Discharge Instructions You were cared for by a hospitalist during your hospital stay. If you have any questions about your discharge medications or the care you received while you were in the hospital after you are discharged, you can call the unit and asked to speak with the hospitalist on call if the hospitalist that took care of you is not available. Once you are discharged, your primary care physician will handle any further medical issues. Please note that NO REFILLS for any discharge medications will be authorized once you are discharged, as it is imperative that you return to your primary care physician (or establish a relationship with a primary care physician if you do not have one) for your aftercare needs so that they can reassess your need for medications and monitor your lab values.  Discharge Instructions   Diet - low sodium heart healthy    Complete by:  As directed      Discharge instructions    Complete by:  As directed   Daily dressing .  Follow upw ith PCP in one week to re evaluate the wound.          Current Discharge Medication List    START taking these medications   Details  amLODipine (NORVASC) 5 MG tablet Take 1 tablet (5 mg total) by mouth daily. Qty: 30 tablet, Refills: 1    cephALEXin (KEFLEX) 500 MG capsule Take  1 capsule (500 mg total) by mouth 4 (four) times daily. Qty: 32 capsule, Refills: 0    ketorolac (TORADOL) 10 MG tablet Take 1 tablet (10 mg total) by mouth every 6 (six) hours as needed. Qty: 20 tablet, Refills: 0    metFORMIN (GLUCOPHAGE) 500 MG tablet Take 1 tablet (500 mg total) by mouth daily with breakfast. Qty: 30 tablet, Refills: 1      CONTINUE these medications which have NOT CHANGED   Details  acetaminophen (TYLENOL) 500 MG tablet Take 500 mg by mouth every 6 (six) hours as needed for fever.    ibuprofen (ADVIL,MOTRIN) 800 MG tablet Take 1 tablet (800 mg total) by mouth every 8 (eight) hours as needed for  headache. Qty: 21 tablet, Refills: 0    lisinopril (PRINIVIL,ZESTRIL) 20 MG tablet Take 1 tablet (20 mg total) by mouth daily. Qty: 30 tablet, Refills: 1       Allergies  Allergen Reactions  . Oxycodone Shortness Of Breath   Follow-up Information   Follow up with [facility]     On 04/16/2014. (12:15 for hospital follow up)    Contact information:   [address] [place] [id number] [phone]       The results of significant diagnostics from this hospitalization (including imaging, microbiology, ancillary and laboratory) are listed below for reference.    Significant Diagnostic Studies: Dg Chest 2 View  04/12/2014   CLINICAL DATA:  Fever, LEFT lower extremity swelling for 5 days, personal history hypertension, bronchitis, obesity  EXAM: CHEST  2 VIEW  COMPARISON:  04/06/2014  FINDINGS: Normal heart size, mediastinal contours, and pulmonary vascularity.  Lungs clear.  No pleural effusion or pneumothorax.  Mild scattered endplate spur formation thoracic spine.  IMPRESSION: No acute abnormalities.   Electronically Signed   By: Ulyses SouthwardMark  Boles M.D.   On: 04/12/2014 15:05   Ct Angio Chest Pe W/cm &/or Wo Cm  04/06/2014   CLINICAL DATA:  Shortness of breath, fever, chills, cough, and chest pain with history of bronchitis and hypertension  EXAM: CT ANGIOGRAPHY CHEST WITH CONTRAST  TECHNIQUE: Multidetector CT imaging of the chest was performed using the standard protocol during bolus administration of intravenous contrast. Multiplanar CT image reconstructions and MIPs were obtained to evaluate the vascular anatomy.  CONTRAST:  100mL OMNIPAQUE IOHEXOL 350 MG/ML SOLN intravenously the timing of the contrast bolus is suboptimal.  COMPARISON:  Portable chest x-ray of today's date  FINDINGS: The contrast bolus timing is suboptimal. This renders the study nondiagnostic for mid and peripheral pulmonary arterial tree evaluation. The proximal pulmonary arteries are  grossly normal. Contrast within the thoracic aorta is normal with no evidence of a false lumen. There is no thoracic aortic aneurysm. The cardiac chambers are top-normal in size. There is no pericardial effusion. There is no bulky mediastinal or hilar lymphadenopathy. There is a small hiatal hernia.  At lung window settings there is no interstitial or alveolar infiltrate. There are no pulmonary parenchymal masses. There is no pleural effusion.  Within the upper abdomen the observed portions of the liver and spleen are unremarkable. The sternum, thoracic spine, and observed portions of the ribs exhibit no acute abnormalities.  Review of the MIP images confirms the above findings.  IMPRESSION: 1. The study is nondiagnostic in terms of evaluating the pulmonary arterial tree from its mid through distal portions. Proximally no filling defects are demonstrated. The thoracic aorta is normal. 2. There is no evidence of CHF nor pneumonia.  There is no pleural nor pericardial effusion. 3. There is a small hiatal hernia. 4. Consideration could be given for repeating this study or for performing a ventilation-perfusion lung scan if acute pulmonary embolism remains strongly suspected.   Electronically Signed   By: David  [place]   On: 04/06/2014 11:49   Dg Chest Port 1 View  (if Code Sepsis Called)  04/06/2014   CLINICAL DATA:  Shortness of breath with fever and headache ; history of bronchitis and hypertension  EXAM: PORTABLE CHEST - 1 VIEW  COMPARISON:  PA and lateral chest x-ray of Nov 24, 2013  FINDINGS: The lungs are adequately inflated. There is no focal infiltrate. The pulmonary vascularity is mildly prominent centrally without definite cephalization. The cardiac silhouette is top-normal in size. There is no pleural effusion. The bony thorax is unremarkable.  IMPRESSION: There is no evidence of pneumonia. There is mild prominence of the central pulmonary vascularity which is at least in part due to the portable  technique. When the patient can tolerate the procedure, a PA and lateral chest x-ray would be useful.   Electronically Signed   By: David  [place]   On: 04/06/2014 09:00    Microbiology: Recent Results (from the past 240 hour(s))  BODY FLUID CULTURE     Status: None   Collection Time    04/09/14  4:00 AM      Result Value Ref Range Status   Specimen Description FLUID LEFT LOWER LEG   Final   Special Requests NONE   Final   Gram Stain     Final   Value: RARE WBC PRESENT, PREDOMINANTLY PMN     NO ORGANISMS SEEN     Performed at [facility]     Performed at [facility]   Culture     Final   Value: NO GROWTH 3 DAYS     Performed at [facility]   Report Status 04/12/2014 FINAL   Final  GRAM STAIN     Status: None   Collection Time    04/09/14  4:00 AM      Result Value Ref Range Status   Specimen Description FLUID LEFT LOWER LEG   Final   Special Requests NONE   Final   Gram Stain     Final   Value: RARE WBC PRESENT, PREDOMINANTLY PMN     NO ORGANISMS SEEN   Report Status 04/09/2014 FINAL   Final  CULTURE, BLOOD (ROUTINE X 2)     Status: None   Collection Time    04/09/14  5:24 AM      Result Value Ref Range Status   Specimen Description BLOOD LEFT ANTECUBITAL   Final   Special Requests     Final   Value: BOTTLES DRAWN AEROBIC AND ANAEROBIC 10CC BLUE  7CC RED   Culture  Setup Time     Final   Value: 04/09/2014 10:07     Performed at [facility]   Culture     Final   Value: NO GROWTH 5 DAYS     Performed at [facility]   Report Status 04/15/2014 FINAL   Final  CULTURE, BLOOD (ROUTINE X 2)     Status: None   Collection Time    04/09/14  5:42 AM      Result Value Ref Range Status   Specimen Description BLOOD LEFT HAND   Final   Special Requests BOTTLES DRAWN AEROBIC ONLY 4CC   Final  Culture  Setup Time     Final   Value: 04/09/2014 10:07     Performed at [facility]   Culture     Final   Value: NO GROWTH 5 DAYS      Performed at [facility]   Report Status 04/15/2014 FINAL   Final     Labs: Basic Metabolic Panel:  Recent Labs Lab 04/09/14 0047 04/09/14 0524 04/12/14 1540 04/14/14 0610  NA 139 139 141 139  K 4.1 3.7 3.8 3.8  CL 102 103 104 101  CO2 25 24 27 28   GLUCOSE 119* 122* 111* 123*  BUN 9 7 6 6   CREATININE 0.86 0.72 0.91 0.76  CALCIUM 9.0 8.5 8.9 8.9   Liver Function Tests:  Recent Labs Lab 04/09/14 0047  AST 27  ALT 30  ALKPHOS 65  BILITOT 0.4  PROT 7.7  ALBUMIN 2.9*   No results found for this basename: LIPASE, AMYLASE,  in the last 168 hours No results found for this basename: AMMONIA,  in the last 168 hours CBC:  Recent Labs Lab 04/09/14 0047 04/09/14 0524 04/12/14 1540 04/14/14 0610  WBC 11.2* 10.1 11.1* 10.2  NEUTROABS 9.1* 7.7  --   --   HGB 13.4 13.4 12.2* 12.3*  HCT 39.4 39.0 36.1* 36.7*  MCV 88.9 87.1 87.6 87.8  PLT 132* 117* 215 254   Cardiac Enzymes: No results found for this basename: CKTOTAL, CKMB, CKMBINDEX, TROPONINI,  in the last 168 hours BNP: BNP (last 3 results)  Recent Labs  11/07/13 1947  PROBNP 74.4   CBG:  Recent Labs Lab 04/14/14 1800 04/14/14 2109 04/15/14 0747 04/15/14 1213  GLUCAP 113* 93 92 104*       Signed:  Lenise Jr  [facility] 04/15/2014, 2:13 PM

## 2014-04-15 NOTE — Evaluation (Signed)
Physical Therapy Evaluation Patient Details Name: Christopher DroneBarry L Moreno MRN: [id number] DOB: 1960-09-30 Today's Date: 04/15/2014   History of Present Illness  Christopher DroneBarry L Moreno is a 53 y.o. male with hx of migraine headache, who presents with left leg swelling and pain  Clinical Impression  Pt very pleasant with 7/10 LLE pain in bed and decreased to 6.5/10 after activity with RN aware. Pt educated for ROM, strengthening, gait and DME use. Pt will benefit from continued RW use until edema and pain resolved. Pt will benefit from acute therapy to maximize function and normalize gait to decrease fall risk and increase independence prior to D/C.     Follow Up Recommendations No PT follow up    Equipment Recommendations  Rolling walker with 5" wheels    Recommendations for Other Services       Precautions / Restrictions Precautions Precautions: Fall      Mobility  Bed Mobility Overal bed mobility: Modified Independent                Transfers Overall transfer level: Modified independent                  Ambulation/Gait Ambulation/Gait assistance: Supervision Ambulation Distance (Feet): 350 Feet Assistive device: Rolling walker (2 wheeled);None Gait Pattern/deviations: Step-through pattern;Decreased stride length   Gait velocity interpretation: Below normal speed for age/gender General Gait Details: First 200' pt walked with RW with cues for sequence and use of Bil UE to off weight LLE. last 150' pt without use of RW with step to pattern with antalgic gait LLE. Pt encouraged to continue RW use until able to tolerate weight bearing  Stairs Stairs: Yes Stairs assistance: Supervision Stair Management: One rail Left;Step to pattern;Forwards;Sideways Number of Stairs: 11 General stair comments: Pt with cues for sequence and use of rail   Wheelchair Mobility    Modified Rankin (Stroke Patients Only)       Balance Overall balance assessment: Needs assistance   Sitting  balance-Leahy Scale: Good       Standing balance-Leahy Scale: Fair                               Pertinent Vitals/Pain Pain Assessment: 0-10 Pain Score: 7  Pain Location: LLE Pain Descriptors / Indicators: Aching Pain Intervention(s): Repositioned;Patient requesting pain meds-RN notified    Home Living Family/patient expects to be discharged to:: Private residence Living Arrangements: Spouse/significant other Available Help at Discharge: Friend(s);Available 24 hours/day Type of Home: House       Home Layout: Two level Home Equipment: None      Prior Function Level of Independence: Independent               Hand Dominance        Extremity/Trunk Assessment   Upper Extremity Assessment: Overall WFL for tasks assessed           Lower Extremity Assessment: LLE deficits/detail   LLE Deficits / Details: pt with edema and pain particularly with ankle ROM limiting strength and function  Cervical / Trunk Assessment: Normal  Communication   Communication: No difficulties  Cognition Arousal/Alertness: Awake/alert Behavior During Therapy: WFL for tasks assessed/performed Overall Cognitive Status: Within Functional Limits for tasks assessed                      General Comments      Exercises General Exercises - Lower Extremity Ankle Circles/Pumps: AROM;Both;5 reps;Seated  Assessment/Plan    PT Assessment Patient needs continued PT services  PT Diagnosis Difficulty walking;Acute pain   PT Problem List Decreased strength;Decreased activity tolerance;Decreased mobility;Decreased knowledge of use of DME;Pain  PT Treatment Interventions Gait training;Stair training;Functional mobility training;Therapeutic activities;Patient/family education;Therapeutic exercise;DME instruction   PT Goals (Current goals can be found in the Care Plan section) Acute Rehab PT Goals Patient Stated Goal: be able to return to work. Pt works at [employer] and  [employer] PT Goal Formulation: With patient Time For Goal Achievement: 04/22/14 Potential to Achieve Goals: Good    Frequency Min 3X/week   Barriers to discharge Decreased caregiver support      Co-evaluation               End of Session   Activity Tolerance: Patient tolerated treatment well Patient left: in chair;with call bell/phone within reach Nurse Communication: Mobility status;Precautions         Time: 4540-9811 PT Time Calculation (min): 20 min   Charges:   PT Evaluation $Initial PT Evaluation Tier I: 1 Procedure PT Treatments $Gait Training: 8-22 mins   PT G CodesDelorse Lek 04/15/2014, 1:07 PM Delaney Meigs, PT [phone]

## 2014-04-16 ENCOUNTER — Ambulatory Visit: Payer: Self-pay | Attending: Internal Medicine | Admitting: Internal Medicine

## 2014-04-16 VITALS — BP 149/90 | HR 96 | Temp 98.7°F | Resp 24 | Ht 70.0 in | Wt 353.6 lb

## 2014-04-16 DIAGNOSIS — I10 Essential (primary) hypertension: Secondary | ICD-10-CM | POA: Insufficient documentation

## 2014-04-16 DIAGNOSIS — E118 Type 2 diabetes mellitus with unspecified complications: Secondary | ICD-10-CM

## 2014-04-16 DIAGNOSIS — E119 Type 2 diabetes mellitus without complications: Secondary | ICD-10-CM | POA: Insufficient documentation

## 2014-04-16 DIAGNOSIS — L03116 Cellulitis of left lower limb: Secondary | ICD-10-CM | POA: Insufficient documentation

## 2014-04-16 DIAGNOSIS — Z885 Allergy status to narcotic agent status: Secondary | ICD-10-CM | POA: Insufficient documentation

## 2014-04-16 DIAGNOSIS — E669 Obesity, unspecified: Secondary | ICD-10-CM | POA: Insufficient documentation

## 2014-04-16 DIAGNOSIS — Z79899 Other long term (current) drug therapy: Secondary | ICD-10-CM | POA: Insufficient documentation

## 2014-04-16 DIAGNOSIS — Z713 Dietary counseling and surveillance: Secondary | ICD-10-CM | POA: Insufficient documentation

## 2014-04-16 DIAGNOSIS — Z0000 Encounter for general adult medical examination without abnormal findings: Secondary | ICD-10-CM

## 2014-04-16 NOTE — Progress Notes (Signed)
Patient ID: [name], male   DOB: [birth year], 53 y.o.   MRN: [id number]  Patient Demographics  [name], is a 53 y.o. male  WUJ:[id number]  [id number]:[id number]  DOB - 06-Jul-1961  Chief Complaint  Patient presents with  . Establish Care  . Hospitalization Follow-up  . Shortness of Breath  . Hypertension        Subjective:  [name] today is presenting post hospital discharge. Patient was discharged on 10/4.   HPI: Patient is 53 y.o. male with past medical history of  hypertension, obesity, diabetes who was recently hospitalized from 04/09/14-04/15/14 for left leg cellulitis. He was initially placed on broad-spectrum antibiotics, and subsequently discharged on Keflex. Infectious disease consultation was obtained during his hospitalization. A CT angiogram of the chest was negative for pulmonary embolism, and lower extremity Dopplers were negative for DVT. He today presents, as a post hospital followup. Currently, he has no major complaints, claims that the left leg is slowly improving. He has not yet filled his prescriptions that was given to him yesterday, he plans on finding them today.  Patient has also has No headache, No chest pain, No abdominal pain,No Nausea, No new weakness tingling or numbness, No Cough or SOB.      Objective:     Filed Vitals:   04/16/14 1212  BP: 149/90  Pulse: 96  Temp: 98.7 F (37.1 C)  TempSrc: Oral  Resp: 24  Height: 5\' 10"  (1.778 m)  Weight: 353 lb 9.6 oz (160.392 kg)  SpO2: 97%     ALLERGIES:   Allergies  Allergen Reactions  . Oxycodone Shortness Of Breath    PAST MEDICAL HISTORY: Past Medical History  Diagnosis Date  . Hypertension   . Skin infection   . Obesity   . Bronchitis   . Cellulitis and abscess of lower extremity 04/11/2015    PAST SURGICAL HISTORY: History reviewed. No pertinent past surgical history.  FAMILY HISTORY: Family History  Problem Relation Age of Onset  . Diabetes Mother   . Alcohol abuse  Father   . Hypertension Brother   . Asthma Sister     MEDICATIONS: Current Outpatient Prescriptions on File Prior to Visit  Medication Sig  . acetaminophen (TYLENOL) 500 MG tablet Take 500 mg by mouth every 6 (six) hours as needed for fever.  Marland Kitchen ibuprofen (ADVIL,MOTRIN) 800 MG tablet Take 1 tablet (800 mg total) by mouth every 8 (eight) hours as needed for headache.  . lisinopril (PRINIVIL,ZESTRIL) 20 MG tablet Take 1 tablet (20 mg total) by mouth daily.  Marland Kitchen amLODipine (NORVASC) 5 MG tablet Take 1 tablet (5 mg total) by mouth daily.  . cephALEXin (KEFLEX) 500 MG capsule Take 1 capsule (500 mg total) by mouth 4 (four) times daily.  Marland Kitchen ketorolac (TORADOL) 10 MG tablet Take 1 tablet (10 mg total) by mouth every 6 (six) hours as needed.  . metFORMIN (GLUCOPHAGE) 500 MG tablet Take 1 tablet (500 mg total) by mouth daily with breakfast.   No current facility-administered medications on file prior to visit.    SOCIAL HISTORY:   reports that he has never smoked. He has never used smokeless tobacco. He reports that he does not drink alcohol or use illicit drugs.  REVIEW OF SYSTEMS:  Constitutional:   No   Fevers, chills, fatigue.  HEENT:    No headaches, Sore throat,   Cardio-vascular: No chest pain,  Orthopnea, swelling in lower extremities, anasarca, palpitations  GI:  No abdominal pain, nausea, vomiting, diarrhea  Resp: No shortness of breath,  No coughing up of blood.No cough.No wheezing.  Skin:  no rash or lesions.  GU:  no dysuria, change in color of urine, no urgency or frequency.  No flank pain.  Musculoskeletal: No joint pain or swelling.  No decreased range of motion.  No back pain.  Psych: No change in mood or affect. No depression or anxiety.  No memory loss.   Exam  General appearance :Awake, alert, not in any distress. Speech Clear. Not toxic Looking HEENT: Atraumatic and Normocephalic, pupils equally reactive to light and accomodation Neck: supple, no JVD. No  cervical lymphadenopathy.  Chest:Good air entry bilaterally, no added sounds  CVS: S1 S2 regular, no murmurs.  Abdomen: Bowel sounds present, Non tender and not distended with no gaurding, rigidity or rebound. Extremities: Left lower extremity- still significantly edematous from the dorsum of the foot to the mid leg, superficial ulceration from ruptured blisters seen on the anterior aspect of the left leg, minimal discharge, mild erythema. Leg is not tense, sensation is intact. Neurology: Awake alert, and oriented X 3, CN II-XII intact, Non focal Skin:No Rash Wounds:N/A    Data Review   CBC  Recent Labs Lab 04/12/14 1540 04/14/14 0610  WBC 11.1* 10.2  HGB 12.2* 12.3*  HCT 36.1* 36.7*  PLT 215 254  MCV 87.6 87.8  MCH 29.6 29.4  MCHC 33.8 33.5  RDW 13.0 13.0    Chemistries    Recent Labs Lab 04/12/14 1540 04/14/14 0610  NA 141 139  K 3.8 3.8  CL 104 101  CO2 27 28  GLUCOSE 111* 123*  BUN 6 6  CREATININE 0.91 0.76  CALCIUM 8.9 8.9   ------------------------------------------------------------------------------------------------------------------  Recent Labs  04/14/14 1556  HGBA1C 6.0*   ------------------------------------------------------------------------------------------------------------------ No results found for this basename: CHOL, HDL, LDLCALC, TRIG, CHOLHDL, LDLDIRECT,  in the last 72 hours ------------------------------------------------------------------------------------------------------------------ No results found for this basename: TSH, T4TOTAL, FREET3, T3FREE, THYROIDAB,  in the last 72 hours ------------------------------------------------------------------------------------------------------------------ No results found for this basename: VITAMINB12, FOLATE, FERRITIN, TIBC, IRON, RETICCTPCT,  in the last 72 hours  Coagulation profile  No results found for this basename: INR, PROTIME,  in the last 168 hours     Assessment & Plan   Recent hospitalization for left leg cellulitis - Was on broad-spectrum antibiotics when admitted, subsequently discharged on Keflex on 10/4. Lower extremity ultrasound was negative for DVT. Have asked patient to continue with Keflex as prescribed on discharge, continue with discharge her wound care instructions, and followup in one week to assess if getting better. Will make referral to outpatient wound care as well. Patient is aware of, that if he has fever, excessive foul-smelling drainage, excessive pain that he is to seek immediate medical attention either in this clinic or go to the emergency room.  Hypertension - Controlled with lisinopril and amlodipine  Diabetes - Last A1c 6.0-we'll continue cautiously with metformin-repeat A1c in 3- 6 months.  Obesity - Counseled regarding importance of weight loss  Health Maintenance -Colonoscopy: Will refer to GI -Vaccinations:  Follow up in one week to assess left leg  The patient was given clear instructions to go to ER or return to medical center if symptoms don't improve, worsen or new problems develop. The patient verbalized understanding. The patient was told to call to get lab results if they haven't heard anything in the next week.

## 2014-04-16 NOTE — Progress Notes (Signed)
[facility] establish [facility], and  LLE cellulitis C/O left leg swelling for 9 days and pain; rates 7.5/10 at present Trying to lose weight Patient has not yet filled meds from hosp discharge

## 2014-04-20 ENCOUNTER — Ambulatory Visit: Payer: Self-pay | Admitting: Internal Medicine

## 2014-04-23 ENCOUNTER — Ambulatory Visit: Payer: Self-pay

## 2014-04-23 ENCOUNTER — Encounter: Payer: Self-pay | Admitting: Internal Medicine

## 2014-04-23 ENCOUNTER — Ambulatory Visit: Payer: Self-pay | Attending: Internal Medicine | Admitting: Internal Medicine

## 2014-04-23 VITALS — BP 135/85 | HR 81 | Temp 98.4°F | Ht 70.0 in | Wt 355.0 lb

## 2014-04-23 DIAGNOSIS — Z79899 Other long term (current) drug therapy: Secondary | ICD-10-CM | POA: Insufficient documentation

## 2014-04-23 DIAGNOSIS — I10 Essential (primary) hypertension: Secondary | ICD-10-CM | POA: Insufficient documentation

## 2014-04-23 DIAGNOSIS — R7309 Other abnormal glucose: Secondary | ICD-10-CM | POA: Insufficient documentation

## 2014-04-23 DIAGNOSIS — L03116 Cellulitis of left lower limb: Secondary | ICD-10-CM | POA: Insufficient documentation

## 2014-04-23 DIAGNOSIS — R7303 Prediabetes: Secondary | ICD-10-CM

## 2014-04-23 LAB — GLUCOSE, POCT (MANUAL RESULT ENTRY): POC GLUCOSE: 142 mg/dL — AB (ref 70–99)

## 2014-04-23 NOTE — Progress Notes (Signed)
Patient ID: [name], male   DOB: [birth year], 53 y.o.   MRN: [id number]  CC: wound check  HPI:  Patient presents to clinic for a wound recheck.  He states that the pain is from the tightness of the scab that is causing itching as well.  He reports improvement in swelling of the left leg.  He has been using the supplies for wound care given to him on last visit but he is unable to name the supplies.  He states that it is a clear film to keep the wound from sticking to the gauze.  He is currently still taking the Keflex.   Has lost 15 pounds since hospital discharge and has plan for further weight loss.  He denies complications with blood pressure medication or metformin.  Allergies  Allergen Reactions  . Oxycodone Shortness Of Breath   Past Medical History  Diagnosis Date  . Hypertension   . Skin infection   . Obesity   . Bronchitis   . Cellulitis and abscess of lower extremity 04/11/2015   Current Outpatient Prescriptions on File Prior to Visit  Medication Sig Dispense Refill  . acetaminophen (TYLENOL) 500 MG tablet Take 500 mg by mouth every 6 (six) hours as needed for fever.      Marland Kitchen. amLODipine (NORVASC) 5 MG tablet Take 1 tablet (5 mg total) by mouth daily.  30 tablet  1  . cephALEXin (KEFLEX) 500 MG capsule Take 1 capsule (500 mg total) by mouth 4 (four) times daily.  32 capsule  0  . ibuprofen (ADVIL,MOTRIN) 800 MG tablet Take 1 tablet (800 mg total) by mouth every 8 (eight) hours as needed for headache.  21 tablet  0  . ketorolac (TORADOL) 10 MG tablet Take 1 tablet (10 mg total) by mouth every 6 (six) hours as needed.  20 tablet  0  . lisinopril (PRINIVIL,ZESTRIL) 20 MG tablet Take 1 tablet (20 mg total) by mouth daily.  30 tablet  1  . metFORMIN (GLUCOPHAGE) 500 MG tablet Take 1 tablet (500 mg total) by mouth daily with breakfast.  30 tablet  1   No current facility-administered medications on file prior to visit.   Family History  Problem Relation Age of Onset  . Diabetes  Mother   . Alcohol abuse Father   . Hypertension Brother   . Asthma Sister    History   Social History  . Marital Status: Married    Spouse Name: N/A    Number of Children: N/A  . Years of Education: N/A   Occupational History  . Not on file.   Social History Main Topics  . Smoking status: Never Smoker   . Smokeless tobacco: Never Used  . Alcohol Use: No  . Drug Use: No  . Sexual Activity: Not on file   Other Topics Concern  . Not on file   Social History Narrative  . No narrative on file    Review of Systems: Constitutional: Negative for fever, chills, diaphoresis, activity change, appetite change and fatigue. HENT: Negative for ear pain, nosebleeds, congestion, facial swelling, rhinorrhea, neck pain, neck stiffness and ear discharge.  Eyes: Negative for pain, discharge, redness, itching and visual disturbance. Respiratory: Negative for cough, choking, chest tightness, shortness of breath, wheezing and stridor.  Cardiovascular: Negative for chest pain, palpitations and leg swelling. Gastrointestinal: Negative for abdominal distention. Genitourinary: Negative for dysuria, urgency, frequency, hematuria, flank pain, decreased urine volume, difficulty urinating and dyspareunia.  Musculoskeletal: Negative for back pain, joint  swelling, arthralgias and gait problem. Neurological: Negative for dizziness, tremors, seizures, syncope, facial asymmetry, speech difficulty, weakness, light-headedness, numbness and headaches.  Hematological: Negative for adenopathy. Does not bruise/bleed easily. Psychiatric/Behavioral: Negative for hallucinations, behavioral problems, confusion, dysphoric mood, decreased concentration and agitation.    Objective:   Filed Vitals:   04/23/14 1033  BP: 135/85  Pulse: 81  Temp: 98.4 F (36.9 C)    Physical Exam  Cardiovascular: Normal rate, regular rhythm and normal heart sounds.   Pulmonary/Chest: Effort normal and breath sounds normal.    Musculoskeletal: He exhibits edema (2+ non-pitting edema left lower extremity). He exhibits no tenderness.  Neurological: He is alert. He has normal reflexes.  Skin: Skin is warm and dry.     Scabbed over lesion from cellulitis     Lab Results  Component Value Date   WBC 10.2 04/14/2014   HGB 12.3* 04/14/2014   HCT 36.7* 04/14/2014   MCV 87.8 04/14/2014   PLT 254 04/14/2014   Lab Results  Component Value Date   CREATININE 0.76 04/14/2014   BUN 6 04/14/2014   NA 139 04/14/2014   K 3.8 04/14/2014   CL 101 04/14/2014   CO2 28 04/14/2014    Lab Results  Component Value Date   HGBA1C 6.0* 04/14/2014   Lipid Panel  No results found for this basename: chol, trig, hdl, cholhdl, vldl, ldlcalc       Assessment and plan:   [name] was seen today for wound check.  Diagnoses and associated orders for this visit:  Cellulitis of left leg Scabbed over large lesion  Essential hypertension Blood pressure controlled  Morbid obesity Continue weight loss, low fat diet  Prediabetes - Glucose (CBG) Continue metformin  Return in about 3 months (around 07/24/2014) for DM/HTN.       Holland CommonsKECK, Torell Minder, NP-C [facility] [phone] 04/23/2014, 11:24 AM

## 2014-04-23 NOTE — Patient Instructions (Signed)

## 2014-04-23 NOTE — Progress Notes (Signed)
Patient here to follow-up on wound on left shin.

## 2014-04-25 ENCOUNTER — Ambulatory Visit: Payer: Self-pay

## 2014-05-09 ENCOUNTER — Ambulatory Visit: Payer: Self-pay

## 2014-05-14 ENCOUNTER — Ambulatory Visit: Payer: Self-pay

## 2014-05-14 ENCOUNTER — Encounter (HOSPITAL_BASED_OUTPATIENT_CLINIC_OR_DEPARTMENT_OTHER): Payer: Self-pay | Attending: Plastic Surgery

## 2014-05-14 DIAGNOSIS — I872 Venous insufficiency (chronic) (peripheral): Secondary | ICD-10-CM | POA: Insufficient documentation

## 2014-05-14 DIAGNOSIS — L97929 Non-pressure chronic ulcer of unspecified part of left lower leg with unspecified severity: Secondary | ICD-10-CM | POA: Insufficient documentation

## 2014-05-14 LAB — GLUCOSE, CAPILLARY: GLUCOSE-CAPILLARY: 88 mg/dL (ref 70–99)

## 2014-05-15 NOTE — Progress Notes (Signed)
[facility]  NAME:  [name], [name]                 ACCOUNT NO.:  [id number]  MEDICAL RECORD NO.:  [id number]      DATE OF BIRTH:  1961/02/10  PHYSICIAN:  Wayland Denislaire Sanger, DO            VISIT DATE:                                  OFFICE VISIT   HISTORY OF PRESENT ILLNESS:  The patient is a 53 year old male who is here for a visit for his left leg chronic venous insufficiency and previous ulcer.  He was recently hospitalized for swelling, cellulitis, infection in his left leg and underwent treatment with antibiotics, elevation and was found to have diabetes.  He was discharged and healed within the past 2 weeks.  PAST MEDICAL HISTORY:  Includes hypertension, obesity, diabetes, and left leg cellulitis.  ALLERGIES:  OXYCODONE.  MEDICATIONS:  Norvasc, Advil, Toradol, lisinopril, metformin, and Tylenol.  SOCIAL HISTORY:  Has family and help at home.  PHYSICAL EXAMINATION:  He is alert, oriented, and cooperative.  REVIEW OF SYSTEMS:  Negative.  Pupils are equal.  His breathing is unlabored.  His heart rate is regular.  His abdomen is soft.  His pulses are present, but weak.  His labs from the hospital were reviewed and show no evidence of deep venous thrombosis, but limited study due to his size.  His labs are in the chart and 2 days ago, his white count was normal, his hemoglobin was 12.3.  His glucose was elevated and his hemoglobin A1c was marked as 6.0.  His vitals are also noted in the chart.  He is not a smoker.  He has dry and little bit of scaling on his left leg, but no open wounds. He showed pictures and so there is very good healing from the wounds that he did have.  There it is strong recommendation for elevation, multivitamin, vitamin C, blood sugar control, protein intake, regulation of blood sugar, compression stocking and lotion on his legs, and he is to follow up as needed.     Wayland Denislaire Sanger, DO     CS/MEDQ  D:  05/14/2014  T:   05/15/2014  Job:  [id number]

## 2014-06-18 ENCOUNTER — Ambulatory Visit: Payer: Self-pay

## 2014-11-06 ENCOUNTER — Encounter (HOSPITAL_COMMUNITY): Payer: Self-pay | Admitting: *Deleted

## 2014-11-06 ENCOUNTER — Emergency Department (HOSPITAL_COMMUNITY)
Admission: EM | Admit: 2014-11-06 | Discharge: 2014-11-06 | Disposition: A | Discharge status: Home / Self Care | Payer: Self-pay | Attending: Emergency Medicine | Admitting: Emergency Medicine

## 2014-11-06 ENCOUNTER — Telehealth: Payer: Self-pay | Admitting: Internal Medicine

## 2014-11-06 ENCOUNTER — Telehealth: Payer: Self-pay | Admitting: General Practice

## 2014-11-06 DIAGNOSIS — M1711 Unilateral primary osteoarthritis, right knee: Secondary | ICD-10-CM | POA: Insufficient documentation

## 2014-11-06 DIAGNOSIS — Z792 Long term (current) use of antibiotics: Secondary | ICD-10-CM | POA: Insufficient documentation

## 2014-11-06 DIAGNOSIS — Z8709 Personal history of other diseases of the respiratory system: Secondary | ICD-10-CM | POA: Insufficient documentation

## 2014-11-06 DIAGNOSIS — Z87828 Personal history of other (healed) physical injury and trauma: Secondary | ICD-10-CM | POA: Insufficient documentation

## 2014-11-06 DIAGNOSIS — Z872 Personal history of diseases of the skin and subcutaneous tissue: Secondary | ICD-10-CM | POA: Insufficient documentation

## 2014-11-06 DIAGNOSIS — M5410 Radiculopathy, site unspecified: Secondary | ICD-10-CM | POA: Insufficient documentation

## 2014-11-06 DIAGNOSIS — M722 Plantar fascial fibromatosis: Secondary | ICD-10-CM | POA: Insufficient documentation

## 2014-11-06 DIAGNOSIS — I10 Essential (primary) hypertension: Secondary | ICD-10-CM | POA: Insufficient documentation

## 2014-11-06 DIAGNOSIS — Z79899 Other long term (current) drug therapy: Secondary | ICD-10-CM | POA: Insufficient documentation

## 2014-11-06 DIAGNOSIS — E669 Obesity, unspecified: Secondary | ICD-10-CM | POA: Insufficient documentation

## 2014-11-06 LAB — CBG MONITORING, ED: GLUCOSE-CAPILLARY: 93 mg/dL (ref 70–99)

## 2014-11-06 MED ORDER — METHOCARBAMOL 500 MG PO TABS: 1000.0000 mg | ORAL_TABLET | Freq: Four times a day (QID) | ORAL | Status: DC | PRN

## 2014-11-06 MED ORDER — HYDROCODONE-ACETAMINOPHEN 5-325 MG PO TABS: ORAL_TABLET | ORAL | Status: DC

## 2014-11-06 NOTE — ED Notes (Signed)
Pt states that he is having rt leg pain for several days. Pt states that it feels like his leg is asleep. Pt also reports "bad knees" and bilateral foot pain. Pt states that otc medications are not working anymore.

## 2014-11-06 NOTE — ED Provider Notes (Signed)
CSN: [id number]     Arrival date & time 11/06/14  1237 History  This chart was scribed for Rhea Bleacher, PA working with Vanetta Mulders, MD by Elveria Rising, ED Scribe. This patient was seen in room TR02C/TR02C and the patient's care was started at 1:48 PM.   Chief Complaint  Patient presents with  . Leg Pain   The history is provided by the patient. No language interpreter was used.   HPI Comments: [name] is a 54 y.o. male with PMHx of Hypertension, prediabetes on Metformin presents to the Emergency Department complaining of lateral right leg pain, ongoing for two days now. No injury. Patient reports shooting, throbbing pain down the length of the lateral aspect of his leg from his thigh extending to his calf. Patient also reports right heel pain that is exacerbated with ambulation. Patient shares history of obesity and arthritis in his knee bilaterally. Patient states that his treatment with Aleve, that he typically takes for his arthritis is no longer effective, nor does it relieve his leg pain. Patient primarily concerned about his leg pain because he was recently hospitalized for cellulitis in his left lower leg. Pt denies claudication type symptoms. No history of DVT or blood clot. H/o lower back strain, no disc problems.   Patient denies associated back, numbness or tingling. He denies to me that his leg is 'falling asleep' and denies 'pins and needles sensation' which is contradictory to nursing note. Patient denies warning symptoms of back pain including: fecal incontinence, urinary retention or overflow incontinence, night sweats, waking from sleep with back pain, unexplained fevers or weight loss, h/o cancer, IVDU, recent trauma.      Past Medical History  Diagnosis Date  . Hypertension   . Skin infection   . Obesity   . Bronchitis   . Cellulitis and abscess of lower extremity 04/11/2015   History reviewed. No pertinent past surgical history. Family History  Problem Relation  Age of Onset  . Diabetes Mother   . Alcohol abuse Father   . Hypertension Brother   . Asthma Sister    History  Substance Use Topics  . Smoking status: Never Smoker   . Smokeless tobacco: Never Used  . Alcohol Use: No    Review of Systems  Constitutional: Negative for fever.  HENT: Negative for rhinorrhea and sore throat.   Eyes: Negative for redness.  Respiratory: Negative for cough.   Cardiovascular: Negative for chest pain and leg swelling.  Gastrointestinal: Negative for nausea, vomiting, abdominal pain and diarrhea.  Genitourinary: Negative for dysuria.  Musculoskeletal: Positive for myalgias and arthralgias. Negative for back pain, joint swelling and gait problem.  Skin: Negative for color change, rash and wound.  Neurological: Negative for weakness, numbness and headaches.      Allergies  Oxycodone  Home Medications   Prior to Admission medications   Medication Sig Start Date End Date Taking? Authorizing Provider  acetaminophen (TYLENOL) 500 MG tablet Take 500 mg by mouth every 6 (six) hours as needed for fever. 04/06/14   Fayrene Helper, PA-C  amLODipine (NORVASC) 5 MG tablet Take 1 tablet (5 mg total) by mouth daily. 04/15/14   Kathlen Mody, MD  cephALEXin (KEFLEX) 500 MG capsule Take 1 capsule (500 mg total) by mouth 4 (four) times daily. 04/15/14   Kathlen Mody, MD  ibuprofen (ADVIL,MOTRIN) 800 MG tablet Take 1 tablet (800 mg total) by mouth every 8 (eight) hours as needed for headache. 04/06/14   Fayrene Helper, PA-C  ketorolac (  TORADOL) 10 MG tablet Take 1 tablet (10 mg total) by mouth every 6 (six) hours as needed. 04/15/14   Kathlen ModyVijaya Akula, MD  lisinopril (PRINIVIL,ZESTRIL) 20 MG tablet Take 1 tablet (20 mg total) by mouth daily. 04/06/14   Fayrene HelperBowie Tran, PA-C  metFORMIN (GLUCOPHAGE) 500 MG tablet Take 1 tablet (500 mg total) by mouth daily with breakfast. 04/15/14   Kathlen ModyVijaya Akula, MD   Triage Vitals: BP 135/75 mmHg  Pulse 81  Temp(Src) 98 F (36.7 C) (Oral)  Resp 16  Ht 5'  10" (1.778 m)  Wt 358 lb 11.2 oz (162.705 kg)  BMI 51.47 kg/m2  SpO2 98% Physical Exam  Constitutional: He is oriented to person, place, and time. He appears well-developed and well-nourished. No distress.  HENT:  Head: Normocephalic and atraumatic.  Eyes: Conjunctivae and EOM are normal. Right eye exhibits no discharge. Left eye exhibits no discharge.  Neck: Normal range of motion. Neck supple. No tracheal deviation present.  Cardiovascular: Normal rate, regular rhythm and normal heart sounds.   Pulses:      Dorsalis pedis pulses are 2+ on the right side, and 2+ on the left side.       Posterior tibial pulses are 2+ on the right side, and 2+ on the left side.  Strong pedal pulses bilaterally  Pulmonary/Chest: Effort normal and breath sounds normal. No respiratory distress.  Abdominal: Soft. There is no tenderness.  Musculoskeletal: Normal range of motion.       Right hip: He exhibits normal range of motion and normal strength.       Right knee: He exhibits normal range of motion, no swelling and no effusion. No tenderness found.       Right ankle: Normal. No tenderness. Achilles tendon normal.       Right upper leg: He exhibits tenderness. He exhibits no bony tenderness and no swelling.       Right lower leg: He exhibits no tenderness, no bony tenderness and no swelling.       Right foot: There is tenderness (Plantar fascia). There is normal range of motion and no bony tenderness.       Feet:  Neurological: He is alert and oriented to person, place, and time.  Skin: Skin is warm and dry.  Psychiatric: He has a normal mood and affect. His behavior is normal.  Nursing note and vitals reviewed.   ED Course  Procedures (including critical care time)  COORDINATION OF CARE: 1:48 PM- Discussed treatment plan with patient at bedside and patient agreed to plan.   Labs Review Labs Reviewed  CBG MONITORING, ED    Imaging Review No results found.   EKG Interpretation None        2:07 PM Patient seen and examined. CBG checked and is normal.    Vital signs reviewed and are as follows: Filed Vitals:   11/06/14 1245  BP: 135/75  Pulse: 81  Temp: 98 F (36.7 C)  Resp: 16    No red flag s/s of low back pain. Patient was counseled on back pain precautions and told to do activity as tolerated but do not lift, push, or pull heavy objects more than 10 pounds for the next week.  Patient counseled to use ice or heat on back for no longer than 15 minutes every hour.   Patient prescribed muscle relaxer and counseled on proper use of muscle relaxant medication.    Patient prescribed narcotic pain medicine and counseled on proper use of narcotic pain medications.  Counseled not to combine this medication with others containing tylenol.   Urged patient not to drink alcohol, drive, or perform any other activities that requires focus while taking either of these medications.  Patient urged to follow-up with PCP if pain does not improve with treatment and rest or if pain becomes recurrent. Urged to return with worsening severe pain, loss of bowel or bladder control, trouble walking.   The patient verbalizes understanding and agrees with the plan.   Patient also states that he is expecting to receive refills of his metformin and blood pressure medication in 2 days. Encouraged PCP follow-up for recheck of all symptoms in the next 3 days.     MDM   Final diagnoses:  Radicular leg pain  Primary osteoarthritis of right knee  Plantar fasciitis of right foot   Patient with chronic knee pain related to osteoarthritis and right foot pain consistent with plantar fasciitis for the past 30 days. The patient is here today because of new lateral shooting pain in his right thigh. No back pain or red flags of back pain. There are no skin signs of cellulitis. Patient was concerned of cellulitis given his recent history. No history of claudication and patient has normal pedal pulses. No  calf pain, swelling, or tenderness, or other risk factors for DVT. No history of DVT. Do not feel that vascular ultrasound is indicated at this time given history and physical. Will treat for what is most likely radicular pain. Patient's lower extremity is neurovascularly intact otherwise. Patient has PCP follow-up.  I personally performed the services described in this documentation, which was scribed in my presence. The recorded information has been reviewed and is accurate.    Renne Crigler, PA-C 11/06/14 1410  Vanetta Mulders, MD 11/08/14 1725

## 2014-11-06 NOTE — Discharge Instructions (Signed)
Please read and follow all provided instructions.  Your diagnoses today include:  1. Radicular leg pain   2. Primary osteoarthritis of right knee   3. Plantar fasciitis of right foot     Tests performed today include:  Vital signs - see below for your results today  Blood sugar - normal  Medications prescribed:   Robaxin (methocarbamol) - muscle relaxer medication  DO NOT drive or perform any activities that require you to be awake and alert because this medicine can make you drowsy.    Vicodin (hydrocodone/acetaminophen) - narcotic pain medication  DO NOT drive or perform any activities that require you to be awake and alert because this medicine can make you drowsy. BE VERY CAREFUL not to take multiple medicines containing Tylenol (also called acetaminophen). Doing so can lead to an overdose which can damage your liver and cause liver failure and possibly death.  Take any prescribed medications only as directed.  Home care instructions:   Follow any educational materials contained in this packet  Please rest, use ice or heat on your back for the next several days  Do not lift, push, pull anything more than 10 pounds for the next week  Follow-up instructions: Please follow-up with your primary care provider in the next 3 days for further evaluation of your symptoms.   Return instructions:  SEEK IMMEDIATE MEDICAL ATTENTION IF YOU HAVE:  New numbness, tingling, weakness, or problem with the use of your arms or legs  Severe back pain not relieved with medications  Loss control of your bowels or bladder  Increasing pain in any areas of the body (such as chest or abdominal pain)  Shortness of breath, dizziness, or fainting.   Worsening nausea (feeling sick to your stomach), vomiting, fever, or sweats  Any other emergent concerns regarding your health   Additional Information:  Your vital signs today were: BP 135/75 mmHg   Pulse 81   Temp(Src) 98 F (36.7 C)  (Oral)   Resp 16   Ht 5\' 10"  (1.778 m)   Wt 358 lb 11.2 oz (162.705 kg)   BMI 51.47 kg/m2   SpO2 98% If your blood pressure (BP) was elevated above 135/85 this visit, please have this repeated by your doctor within one month. --------------

## 2014-11-06 NOTE — Telephone Encounter (Signed)
Patient is requesting a refill for:  metFORMIN (GLUCOPHAGE) 500 MG tablet ibuprofen (ADVIL,MOTRIN) 800 MG tablet lisinopril (PRINIVIL,ZESTRIL) 20 MG tablet

## 2014-11-20 ENCOUNTER — Ambulatory Visit: Payer: Self-pay | Attending: Internal Medicine | Admitting: Internal Medicine

## 2014-11-20 ENCOUNTER — Encounter: Payer: Self-pay | Admitting: Internal Medicine

## 2014-11-20 VITALS — BP 136/91 | HR 82 | Temp 98.0°F | Resp 17 | Ht 70.0 in | Wt 356.0 lb

## 2014-11-20 DIAGNOSIS — E663 Overweight: Secondary | ICD-10-CM | POA: Insufficient documentation

## 2014-11-20 DIAGNOSIS — I10 Essential (primary) hypertension: Secondary | ICD-10-CM | POA: Insufficient documentation

## 2014-11-20 DIAGNOSIS — Z6841 Body Mass Index (BMI) 40.0 and over, adult: Secondary | ICD-10-CM | POA: Insufficient documentation

## 2014-11-20 MED ORDER — AMLODIPINE BESYLATE 5 MG PO TABS: 5.0000 mg | ORAL_TABLET | Freq: Every day | ORAL | Status: DC

## 2014-11-20 MED ORDER — LISINOPRIL 20 MG PO TABS: 20.0000 mg | ORAL_TABLET | Freq: Every day | ORAL | Status: DC

## 2014-11-20 NOTE — Progress Notes (Signed)
Pt is here following up his HTN. Pt states that he quit taking his BP medications b/c his pressures were fine but he still wants a refill.

## 2014-11-20 NOTE — Progress Notes (Signed)
Patient ID: [name], male   DOB: Nov 28, 1960, 54 y.o.   MRN: [id number] Subjective:  [name] is a 54 y.o. male with hypertension.  Patient reports that he ran out of his blood pressure medication last month and never came back for refills. He thought that he blood pressure improved because he did not have symptoms.   Current Outpatient Prescriptions  Medication Sig Dispense Refill  . acetaminophen (TYLENOL) 500 MG tablet Take 500 mg by mouth every 6 (six) hours as needed for fever.    Marland Kitchen. HYDROcodone-acetaminophen (NORCO/VICODIN) 5-325 MG per tablet Take 1-2 tablets every 6 hours as needed for severe pain 8 tablet 0  . ketorolac (TORADOL) 10 MG tablet Take 1 tablet (10 mg total) by mouth every 6 (six) hours as needed. 20 tablet 0  . metFORMIN (GLUCOPHAGE) 500 MG tablet Take 1 tablet (500 mg total) by mouth daily with breakfast. 30 tablet 1  . amLODipine (NORVASC) 5 MG tablet Take 1 tablet (5 mg total) by mouth daily. (Patient not taking: Reported on 11/20/2014) 30 tablet 1  . cephALEXin (KEFLEX) 500 MG capsule Take 1 capsule (500 mg total) by mouth 4 (four) times daily. (Patient not taking: Reported on 11/20/2014) 32 capsule 0  . ibuprofen (ADVIL,MOTRIN) 800 MG tablet Take 1 tablet (800 mg total) by mouth every 8 (eight) hours as needed for headache. (Patient not taking: Reported on 11/20/2014) 21 tablet 0  . lisinopril (PRINIVIL,ZESTRIL) 20 MG tablet Take 1 tablet (20 mg total) by mouth daily. (Patient not taking: Reported on 11/20/2014) 30 tablet 1  . methocarbamol (ROBAXIN) 500 MG tablet Take 2 tablets (1,000 mg total) by mouth every 6 (six) hours as needed (leg pain). (Patient not taking: Reported on 11/20/2014) 20 tablet 0   No current facility-administered medications for this visit.    Hypertension ROS: not taking medications regularly as instructed, no TIA's, no chest pain on exertion, no dyspnea on exertion and no swelling of ankles.    Objective:  BP 136/91 mmHg  Pulse 82  Temp(Src)  98 F (36.7 C) (Oral)  Resp 17  Ht 5\' 10"  (1.778 m)  Wt 356 lb (161.481 kg)  BMI 51.08 kg/m2  SpO2 92%  Appearance alert, well appearing, and in no distress, oriented to person, place, and time and overweight. General exam BP noted to be borderline elevated today in office, S1, S2 normal, no gallop, no murmur, chest clear, no JVD, no HSM, no edema, CVS exam  - normal rate, regular rhythm, normal S1, S2, no murmurs, rubs, clicks or gallops, normal bilateral carotid upstroke without bruits.  Lab review: labs are reviewed, up to date and normal.   Assessment:   Hypertension needs improvement, needs to follow diet more regularly and loss a substantial amout of weight.   Plan:  Reviewed diet, exercise and weight control. Recommended sodium restriction. Cardiovascular risk and specific lipid/LDL goals reviewed. Follow up: 3 months and as needed.   Holland CommonsKECK, VALERIE, NP 11/20/2014 11:15 PM

## 2014-11-20 NOTE — Patient Instructions (Signed)
Exercise to Lose Weight Exercise and a healthy diet may help you lose weight. Your doctor may suggest specific exercises. EXERCISE IDEAS AND TIPS  Choose low-cost things you enjoy doing, such as walking, bicycling, or exercising to workout videos.  Take stairs instead of the elevator.  Walk during your lunch break.  Park your car further away from work or school.  Go to a gym or an exercise class.  Start with 5 to 10 minutes of exercise each day. Build up to 30 minutes of exercise 4 to 6 days a week.  Wear shoes with good support and comfortable clothes.  Stretch before and after working out.  Work out until you breathe harder and your heart beats faster.  Drink extra water when you exercise.  Do not do so much that you hurt yourself, feel dizzy, or get very short of breath. Exercises that burn about 150 calories:  Running 1  miles in 15 minutes.  Playing volleyball for 45 to 60 minutes.  Washing and waxing a car for 45 to 60 minutes.  Playing touch football for 45 minutes.  Walking 1  miles in 35 minutes.  Pushing a stroller 1  miles in 30 minutes.  Playing basketball for 30 minutes.  Raking leaves for 30 minutes.  Bicycling 5 miles in 30 minutes.  Walking 2 miles in 30 minutes.  Dancing for 30 minutes.  Shoveling snow for 15 minutes.  Swimming laps for 20 minutes.  Walking up stairs for 15 minutes.  Bicycling 4 miles in 15 minutes.  Gardening for 30 to 45 minutes.  Jumping rope for 15 minutes.  Washing windows or floors for 45 to 60 minutes. Document Released: 08/01/2010 Document Revised: 09/21/2011 Document Reviewed: 08/01/2010 [employer] Patient Information 2015 [employer]. This information is not intended to replace advice given to you by your health care provider. Make sure you discuss any questions you have with your health care provider.  

## 2014-12-21 ENCOUNTER — Other Ambulatory Visit: Payer: Self-pay | Admitting: Internal Medicine

## 2014-12-21 MED ORDER — METFORMIN HCL 500 MG PO TABS: 500.0000 mg | ORAL_TABLET | Freq: Every day | ORAL | Status: DC

## 2015-01-09 ENCOUNTER — Emergency Department (HOSPITAL_COMMUNITY): Payer: Self-pay

## 2015-01-09 ENCOUNTER — Encounter (HOSPITAL_COMMUNITY): Payer: Self-pay | Admitting: Emergency Medicine

## 2015-01-09 ENCOUNTER — Emergency Department (HOSPITAL_COMMUNITY)
Admission: EM | Admit: 2015-01-09 | Discharge: 2015-01-09 | Disposition: A | Discharge status: Home / Self Care | Payer: Self-pay | Attending: Emergency Medicine | Admitting: Emergency Medicine

## 2015-01-09 DIAGNOSIS — R05 Cough: Secondary | ICD-10-CM | POA: Insufficient documentation

## 2015-01-09 DIAGNOSIS — R059 Cough, unspecified: Secondary | ICD-10-CM

## 2015-01-09 DIAGNOSIS — Z8619 Personal history of other infectious and parasitic diseases: Secondary | ICD-10-CM | POA: Insufficient documentation

## 2015-01-09 DIAGNOSIS — I10 Essential (primary) hypertension: Secondary | ICD-10-CM | POA: Insufficient documentation

## 2015-01-09 DIAGNOSIS — R062 Wheezing: Secondary | ICD-10-CM | POA: Insufficient documentation

## 2015-01-09 DIAGNOSIS — Z79899 Other long term (current) drug therapy: Secondary | ICD-10-CM | POA: Insufficient documentation

## 2015-01-09 MED ORDER — ALBUTEROL SULFATE (2.5 MG/3ML) 0.083% IN NEBU
5.0000 mg | INHALATION_SOLUTION | Freq: Once | RESPIRATORY_TRACT | Status: AC
  Administered 2015-01-09: 5 mg via RESPIRATORY_TRACT
  Filled 2015-01-09: qty 6

## 2015-01-09 MED ORDER — HYDROCOD POLST-CPM POLST ER 10-8 MG/5ML PO SUER
5.0000 mL | Freq: Two times a day (BID) | ORAL | Status: DC | PRN
Start: 2015-01-09 — End: 2015-06-20

## 2015-01-09 MED ORDER — IPRATROPIUM-ALBUTEROL 0.5-2.5 (3) MG/3ML IN SOLN
3.0000 mL | Freq: Once | RESPIRATORY_TRACT | Status: AC
  Administered 2015-01-09: 3 mL via RESPIRATORY_TRACT
  Filled 2015-01-09: qty 3

## 2015-01-09 MED ORDER — ALBUTEROL SULFATE HFA 108 (90 BASE) MCG/ACT IN AERS
1.0000 | INHALATION_SPRAY | RESPIRATORY_TRACT | Status: DC | PRN
  Administered 2015-01-09: 1 via RESPIRATORY_TRACT
  Filled 2015-01-09: qty 6.7

## 2015-01-09 MED ORDER — PREDNISONE 20 MG PO TABS
60.0000 mg | ORAL_TABLET | Freq: Once | ORAL | Status: AC
  Administered 2015-01-09: 60 mg via ORAL
  Filled 2015-01-09: qty 3

## 2015-01-09 MED ORDER — PREDNISONE 20 MG PO TABS: 40.0000 mg | ORAL_TABLET | Freq: Every day | ORAL | Status: DC

## 2015-01-09 NOTE — ED Notes (Signed)
Congested since last Thursday. Coughing all day and all night despite cough medicine, drops, and mucinex. Had fever off and on this weekend. Feels like he is wheezing worse now.

## 2015-01-09 NOTE — ED Provider Notes (Signed)
CSN: [id number]     Arrival date & time 01/09/15  4098 History   First MD Initiated Contact with Patient 01/09/15 [phone]     Chief Complaint  Patient presents with  . Cough     (Consider location/radiation/quality/duration/timing/severity/associated sxs/prior Treatment) Patient is a 54 y.o. male presenting with cough. The history is provided by the patient and medical records.  Cough Associated symptoms: wheezing     This is a 54 year old male with past medical history significant for hypertension, obesity, recurrent bronchitis, presenting to the ED for cough. Patient states cough began approximately 5 days ago and has been progressively worsening since that time. Cough is productive with yellow/green sputum. He denies any frank shortness of breath or chest pain. He states cough seems worse at night when lying flat and causes him to have broken sleep. He has been taking multiple over-the-counter cough medication, cough drops, and Mucinex. He states he feels that the congestion is "breaking up", however unable to completely relieve symptoms. He denies any fever or chills. He did start wheezing yesterday. No history of underlying asthma or COPD. Patient has never been a smoker. States his fiance has been sick as well recently with similar symptoms, felt he got it from her.  Past Medical History  Diagnosis Date  . Hypertension   . Skin infection   . Obesity   . Bronchitis   . Cellulitis and abscess of lower extremity 04/11/2015   History reviewed. No pertinent past surgical history. Family History  Problem Relation Age of Onset  . Diabetes Mother   . Alcohol abuse Father   . Hypertension Brother   . Asthma Sister    History  Substance Use Topics  . Smoking status: Never Smoker   . Smokeless tobacco: Never Used  . Alcohol Use: Yes     Comment: occasionally    Review of Systems  Respiratory: Positive for cough and wheezing.   All other systems reviewed and are  negative.     Allergies  Oxycodone  Home Medications   Prior to Admission medications   Medication Sig Start Date End Date Taking? Authorizing Provider  acetaminophen (TYLENOL) 500 MG tablet Take 500 mg by mouth every 6 (six) hours as needed for fever. 04/06/14   Fayrene Helper, PA-C  amLODipine (NORVASC) 5 MG tablet Take 1 tablet (5 mg total) by mouth daily. 11/20/14   Ambrose Finland, NP  cephALEXin (KEFLEX) 500 MG capsule Take 1 capsule (500 mg total) by mouth 4 (four) times daily. Patient not taking: Reported on 11/20/2014 04/15/14   Kathlen Mody, MD  HYDROcodone-acetaminophen (NORCO/VICODIN) 5-325 MG per tablet Take 1-2 tablets every 6 hours as needed for severe pain 11/06/14   Renne Crigler, PA-C  ibuprofen (ADVIL,MOTRIN) 800 MG tablet Take 1 tablet (800 mg total) by mouth every 8 (eight) hours as needed for headache. Patient not taking: Reported on 11/20/2014 04/06/14   Fayrene Helper, PA-C  ketorolac (TORADOL) 10 MG tablet Take 1 tablet (10 mg total) by mouth every 6 (six) hours as needed. 04/15/14   Kathlen Mody, MD  lisinopril (PRINIVIL,ZESTRIL) 20 MG tablet Take 1 tablet (20 mg total) by mouth daily. 11/20/14   Ambrose Finland, NP  metFORMIN (GLUCOPHAGE) 500 MG tablet Take 1 tablet (500 mg total) by mouth daily with breakfast. 12/21/14   Ambrose Finland, NP  methocarbamol (ROBAXIN) 500 MG tablet Take 2 tablets (1,000 mg total) by mouth every 6 (six) hours as needed (leg pain). Patient not taking: Reported on 11/20/2014  11/06/14   Renne CriglerJoshua Geiple, PA-C   BP 171/92 mmHg  Pulse 93  Temp(Src) 98.5 F (36.9 C) (Oral)  Resp 18  Ht 5\' 10"  (1.778 m)  Wt 364 lb (165.109 kg)  BMI 52.23 kg/m2  SpO2 99%   Physical Exam  Constitutional: He is oriented to person, place, and time. He appears well-developed and well-nourished. No distress.  Morbidly obese  HENT:  Head: Normocephalic and atraumatic.  Mouth/Throat: Oropharynx is clear and moist.  Eyes: Conjunctivae and EOM are normal. Pupils are equal,  round, and reactive to light.  Neck: Normal range of motion. Neck supple.  Cardiovascular: Normal rate, regular rhythm and normal heart sounds.   Pulmonary/Chest: Effort normal. No respiratory distress. He has wheezes. He has no rhonchi.  Wet cough noted, expiratory wheezes throughout, no retractions or accessory muscle use, O2 sats 99% on room air  Abdominal: Soft. Bowel sounds are normal. There is no tenderness. There is no guarding.  Musculoskeletal: Normal range of motion.  Neurological: He is alert and oriented to person, place, and time.  Skin: Skin is warm and dry. He is not diaphoretic.  Psychiatric: He has a normal mood and affect.  Nursing note and vitals reviewed.   ED Course  Procedures (including critical care time) Labs Review Labs Reviewed - No data to display  Imaging Review Dg Chest 2 View  01/09/2015   CLINICAL DATA:  Cough, or wheezing, fever for 1 week  EXAM: CHEST  2 VIEW  COMPARISON:  04/12/2014  FINDINGS: The heart size and mediastinal contours are within normal limits. Both lungs are clear. The visualized skeletal structures are unremarkable.  IMPRESSION: No active cardiopulmonary disease.   Electronically Signed   By: Elige KoHetal  Patel   On: 01/09/2015 10:45     EKG Interpretation None      MDM   Final diagnoses:  Cough  Wheezing   54 year old male with 5 days of productive cough. Neysa BonitoFianc recently sick with similar symptoms. Patient afebrile, nontoxic.  Vital signs stable on room air. He does have a wet cough noted with expiratory wheezes. Denies CP or SOB.  Will give nebulizer treatment and obtain chest x-ray.  Chest x-ray is negative for acute infiltrate. Patient with continued wheezing after initial nebulizer treatment, given 2 additional treatments and dose of prednisone with vast improvement of symptoms. His vital signs remained stable on room air. Suspect viral bronchitis. Will continue prednisone taper, albuterol inhaler, and cough medicine for home.  Patient encouraged to rest and drink fluids. He will follow-up with his prior care physician.  Discussed plan with patient, he/she acknowledged understanding and agreed with plan of care.  Return precautions given for new or worsening symptoms.  Garlon HatchetLisa M Sanders, PA-C 01/09/15 1237  Gerhard Munchobert Lockwood, MD 01/09/15 [phone]

## 2015-01-09 NOTE — Discharge Instructions (Signed)
Take the prescribed medication as directed. °Follow-up with your primary care physician. °Return to the ED for new or worsening symptoms. ° °

## 2015-01-09 NOTE — ED Notes (Signed)
Patient returned from X-ray 

## 2015-04-11 DIAGNOSIS — L02419 Cutaneous abscess of limb, unspecified: Secondary | ICD-10-CM

## 2015-04-11 DIAGNOSIS — L03119 Cellulitis of unspecified part of limb: Secondary | ICD-10-CM

## 2015-04-11 HISTORY — DX: Cutaneous abscess of limb, unspecified: L02.419

## 2015-04-11 HISTORY — DX: Cellulitis of unspecified part of limb: L03.119

## 2015-04-13 ENCOUNTER — Inpatient Hospital Stay (HOSPITAL_COMMUNITY)
Admission: EM | Admit: 2015-04-13 | Discharge: 2015-04-15 | DRG: 603 | Disposition: A | Discharge status: Home / Self Care | Payer: Self-pay | Attending: Internal Medicine | Admitting: Internal Medicine

## 2015-04-13 ENCOUNTER — Emergency Department (HOSPITAL_COMMUNITY): Payer: Self-pay

## 2015-04-13 DIAGNOSIS — L03116 Cellulitis of left lower limb: Secondary | ICD-10-CM | POA: Diagnosis present

## 2015-04-13 DIAGNOSIS — I10 Essential (primary) hypertension: Secondary | ICD-10-CM | POA: Diagnosis present

## 2015-04-13 DIAGNOSIS — E119 Type 2 diabetes mellitus without complications: Secondary | ICD-10-CM | POA: Diagnosis present

## 2015-04-13 DIAGNOSIS — R7303 Prediabetes: Secondary | ICD-10-CM | POA: Diagnosis present

## 2015-04-13 DIAGNOSIS — Z8249 Family history of ischemic heart disease and other diseases of the circulatory system: Secondary | ICD-10-CM

## 2015-04-13 DIAGNOSIS — Z9119 Patient's noncompliance with other medical treatment and regimen: Secondary | ICD-10-CM

## 2015-04-13 DIAGNOSIS — Z6841 Body Mass Index (BMI) 40.0 and over, adult: Secondary | ICD-10-CM

## 2015-04-13 DIAGNOSIS — L02414 Cutaneous abscess of left upper limb: Principal | ICD-10-CM | POA: Diagnosis present

## 2015-04-13 DIAGNOSIS — B9689 Other specified bacterial agents as the cause of diseases classified elsewhere: Secondary | ICD-10-CM

## 2015-04-13 DIAGNOSIS — L02419 Cutaneous abscess of limb, unspecified: Secondary | ICD-10-CM | POA: Diagnosis present

## 2015-04-13 DIAGNOSIS — Z7984 Long term (current) use of oral hypoglycemic drugs: Secondary | ICD-10-CM

## 2015-04-13 DIAGNOSIS — Z833 Family history of diabetes mellitus: Secondary | ICD-10-CM

## 2015-04-13 DIAGNOSIS — Z825 Family history of asthma and other chronic lower respiratory diseases: Secondary | ICD-10-CM

## 2015-04-13 DIAGNOSIS — L03119 Cellulitis of unspecified part of limb: Secondary | ICD-10-CM

## 2015-04-13 HISTORY — DX: Type 2 diabetes mellitus without complications: E11.9

## 2015-04-13 LAB — COMPREHENSIVE METABOLIC PANEL
ALK PHOS: 58 U/L (ref 38–126)
ALT: 28 U/L (ref 17–63)
AST: 33 U/L (ref 15–41)
Albumin: 2.9 g/dL — ABNORMAL LOW (ref 3.5–5.0)
Anion gap: 8 (ref 5–15)
BILIRUBIN TOTAL: 0.6 mg/dL (ref 0.3–1.2)
BUN: 17 mg/dL (ref 6–20)
CHLORIDE: 104 mmol/L (ref 101–111)
CO2: 25 mmol/L (ref 22–32)
CREATININE: 0.78 mg/dL (ref 0.61–1.24)
Calcium: 9 mg/dL (ref 8.9–10.3)
GFR calc Af Amer: >60 mL/min (ref >60)
Glucose, Bld: 131 mg/dL — ABNORMAL HIGH (ref 65–99)
Potassium: 3.8 mmol/L (ref 3.5–5.1)
Sodium: 137 mmol/L (ref 135–145)
Total Protein: 6.9 g/dL (ref 6.5–8.1)

## 2015-04-13 LAB — CBC WITH DIFFERENTIAL/PLATELET
BASOS ABS: 0 10*3/uL (ref 0.0–0.1)
Basophils Relative: 0 %
Eosinophils Absolute: 0.1 10*3/uL (ref 0.0–0.7)
Eosinophils Relative: 1 %
HEMATOCRIT: 38.4 % — AB (ref 39.0–52.0)
HEMOGLOBIN: 12.8 g/dL — AB (ref 13.0–17.0)
LYMPHS PCT: 17 %
Lymphs Abs: 1.5 10*3/uL (ref 0.7–4.0)
MCH: 29.8 pg (ref 26.0–34.0)
MCHC: 33.3 g/dL (ref 30.0–36.0)
MCV: 89.3 fL (ref 78.0–100.0)
Monocytes Absolute: 0.8 10*3/uL (ref 0.1–1.0)
Monocytes Relative: 9 %
NEUTROS ABS: 6.3 10*3/uL (ref 1.7–7.7)
Neutrophils Relative %: 73 %
PLATELETS: 112 10*3/uL — AB (ref 150–400)
RBC: 4.3 MIL/uL (ref 4.22–5.81)
RDW: 13.6 % (ref 11.5–15.5)
WBC: 8.6 10*3/uL (ref 4.0–10.5)

## 2015-04-13 LAB — GLUCOSE, CAPILLARY: Glucose-Capillary: 130 mg/dL — ABNORMAL HIGH (ref 65–99)

## 2015-04-13 MED ORDER — CEFAZOLIN SODIUM-DEXTROSE 2-3 GM-% IV SOLR
2.0000 g | Freq: Three times a day (TID) | INTRAVENOUS | Status: DC
Start: 2015-04-13 — End: 2015-04-15
  Administered 2015-04-13 – 2015-04-15 (×5): 2 g via INTRAVENOUS
  Filled 2015-04-13 (×8): qty 50

## 2015-04-13 MED ORDER — LISINOPRIL 20 MG PO TABS
20.0000 mg | ORAL_TABLET | Freq: Every day | ORAL | Status: DC
  Administered 2015-04-14 – 2015-04-15 (×2): 20 mg via ORAL
  Filled 2015-04-13 (×2): qty 1

## 2015-04-13 MED ORDER — ACETAMINOPHEN 500 MG PO TABS
500.0000 mg | ORAL_TABLET | Freq: Four times a day (QID) | ORAL | Status: DC | PRN
  Administered 2015-04-14 (×2): 500 mg via ORAL
  Filled 2015-04-13 (×2): qty 1

## 2015-04-13 MED ORDER — ENOXAPARIN SODIUM 40 MG/0.4ML ~~LOC~~ SOLN
40.0000 mg | SUBCUTANEOUS | Status: DC
  Administered 2015-04-13: 40 mg via SUBCUTANEOUS
  Filled 2015-04-13: qty 0.4

## 2015-04-13 MED ORDER — AMLODIPINE BESYLATE 5 MG PO TABS
5.0000 mg | ORAL_TABLET | Freq: Every day | ORAL | Status: DC
  Administered 2015-04-14 – 2015-04-15 (×2): 5 mg via ORAL
  Filled 2015-04-13 (×2): qty 1

## 2015-04-13 MED ORDER — INSULIN ASPART 100 UNIT/ML ~~LOC~~ SOLN: 0.0000 [IU] | Freq: Three times a day (TID) | SUBCUTANEOUS | Status: DC

## 2015-04-13 MED ORDER — INSULIN ASPART 100 UNIT/ML ~~LOC~~ SOLN: 0.0000 [IU] | Freq: Every day | SUBCUTANEOUS | Status: DC

## 2015-04-13 MED ORDER — CLINDAMYCIN PHOSPHATE 900 MG/50ML IV SOLN
900.0000 mg | Freq: Once | INTRAVENOUS | Status: AC
  Administered 2015-04-13: 900 mg via INTRAVENOUS
  Filled 2015-04-13: qty 50

## 2015-04-13 NOTE — ED Notes (Signed)
Pt reports to the ED for eval of left leg swelling and blister. Pt reports he was recently admitted for cellulitis. He is a diabetic. Has been having fevers and chills. Increased warmth, swelling, and erythema noted. CMS and full ROM intact. Pt A&Ox4, resp e/u, and skin warm and dry.

## 2015-04-13 NOTE — H&P (Signed)
Date: 04/13/2015               Patient Name:  [name] MRN: [id number]  DOB: 25-May-1961 Age / Sex: 54 y.o., male   PCP: Ambrose Finland, NP         Medical Service: Internal Medicine Teaching Service         Attending Physician: Dr. Melene Plan, DO    First Contact: Dr. Ladona Ridgel Pager: [phone]  Second Contact: Dr. Delane Ginger Pager: [phone]       After Hours (After 5p/  First Contact Pager: [phone]  weekends / holidays): Second Contact Pager: [phone]   Chief Complaint: LLE swelling  History of Present Illness: Mr. Duchemin is a 54 yo male with DMII and HTN, presenting with 3 day h/o LLE swelling, fever, dizziness, and HA.  Patient reports symptoms started Wednesday night.  He had not been on his medications (Lisinopril, Amlodipine, Metformin).  He first noticed nonpitting leg swelling following a large breakfast at [facility].   He then became dizzy, with HA, fevers and chills Thursday evening.  Symptoms were moderately controlled with tylenol and aleve.  He denies leg pain at that time, but says it was just tense/swollen.  By Friday, his fever and chills had resolved, but his leg was becoming increasingly swollen and puffy.  Saturday morning he noticed it start to blister and he presented to the ED.  He has been ambulate since he first noticed the swelling.  He denies previous wound to the affected area.  He has a h/o bullous cellulitis in Sept 2016 treated with Cefazolin and then Keflex.  He currently denies F/C, HA, or dizziness.  He denies leg pain at rest or with movement of the foot or knee.  He denies calf pain, CP, or SOB.  He denies N/V, C/D, or dysuria.    He has no history of DVT or recent antibiotic use.    He does not check his blood sugar, as he does not have a glucometer.  He admits poor adherence to diabetic diet.  Meds: No current facility-administered medications for this encounter.   Current Outpatient Prescriptions  Medication Sig Dispense Refill  . acetaminophen (TYLENOL) 500  MG tablet Take 500 mg by mouth every 6 (six) hours as needed for fever.    Marland Kitchen amLODipine (NORVASC) 5 MG tablet Take 1 tablet (5 mg total) by mouth daily. 30 tablet 4  . lisinopril (PRINIVIL,ZESTRIL) 20 MG tablet Take 1 tablet (20 mg total) by mouth daily. 30 tablet 4  . metFORMIN (GLUCOPHAGE) 500 MG tablet Take 1 tablet (500 mg total) by mouth daily with breakfast. 30 tablet 3  . naproxen sodium (ANAPROX) 220 MG tablet Take 220 mg by mouth 2 (two) times daily with a meal.    . chlorpheniramine-HYDROcodone (TUSSIONEX PENNKINETIC ER) 10-8 MG/5ML SUER Take 5 mLs by mouth every 12 (twelve) hours as needed for cough. 100 mL 0  . HYDROcodone-acetaminophen (NORCO/VICODIN) 5-325 MG per tablet Take 1-2 tablets every 6 hours as needed for severe pain 8 tablet 0  . ketorolac (TORADOL) 10 MG tablet Take 1 tablet (10 mg total) by mouth every 6 (six) hours as needed. 20 tablet 0  . predniSONE (DELTASONE) 20 MG tablet Take 2 tablets (40 mg total) by mouth daily. Take 40 mg by mouth daily for 3 days, then  by mouth daily for 3 days, then  daily for 3 days 12 tablet 0    Allergies: Allergies as of 04/13/2015 - Review Complete  04/13/2015  Allergen Reaction Noted  . Oxycodone Shortness Of Breath 06/05/2012   Past Medical History  Diagnosis Date  . Hypertension   . Skin infection   . Obesity   . Bronchitis   . Cellulitis and abscess of lower extremity 04/11/2015   No past surgical history on file. Family History  Problem Relation Age of Onset  . Diabetes Mother   . Alcohol abuse Father   . Hypertension Brother   . Asthma Sister    Social History   Social History  . Marital Status: Married    Spouse Name: N/A  . Number of Children: N/A  . Years of Education: N/A   Occupational History  . Not on file.   Social History Main Topics  . Smoking status: Never Smoker   . Smokeless tobacco: Never Used  . Alcohol Use: Yes     Comment: occasionally  . Drug Use: No  . Sexual Activity: Not on  file   Other Topics Concern  . Not on file   Social History Narrative    Review of Systems: Pertinent items are noted in HPI.  Physical Exam: Blood pressure 123/72, pulse 79, temperature 98.8 F (37.1 C), temperature source Oral, resp. rate 22, height 5\' 10"  (1.778 m), weight 357 lb 7 oz (162.133 kg), SpO2 98 %. Physical Exam  Constitutional: He is oriented to person, place, and time and well-developed, well-nourished, and in no distress. No distress.  Morbidly obese, lying in bed, NAD  HENT:  Head: Normocephalic and atraumatic.  Eyes: EOM are normal. No scleral icterus.  Neck: Normal range of motion. No JVD present.  Cardiovascular: Normal rate, regular rhythm, normal heart sounds and intact distal pulses.   Left DP and PT pulses intact.  Pulmonary/Chest: Effort normal and breath sounds normal. No respiratory distress. He has no wheezes.  Abdominal: Soft. He exhibits no distension. There is no tenderness. There is no rebound and no guarding.  Large abdomen  Musculoskeletal: He exhibits no tenderness.  Full ROM of left foot and knee.  No pain with active or passive extension.   Neurological: He is alert and oriented to person, place, and time.  Proprioception of left big toe intact.  Skin: Skin is warm and dry. He is not diaphoretic.  Large area of poorly defined erythema and nonpitting swelling.  Overlying skin warm to touch.  Multiple coalescing bullae present within erythema.  No lymphangitic streaking.  No crepitus.     Lab results: Basic Metabolic Panel:  Recent Labs  16/10/96 1254  NA 137  K 3.8  CL 104  CO2 25  GLUCOSE 131*  BUN 17  CREATININE 0.78  CALCIUM 9.0   Liver Function Tests:  Recent Labs  04/13/15 1254  AST 33  ALT 28  ALKPHOS 58  BILITOT 0.6  PROT 6.9  ALBUMIN 2.9*   No results for input(s): LIPASE, AMYLASE in the last 72 hours. No results for input(s): AMMONIA in the last 72 hours. CBC:  Recent Labs  04/13/15 1254  WBC 8.6   NEUTROABS 6.3  HGB 12.8*  HCT 38.4*  MCV 89.3  PLT 112*   Cardiac Enzymes: No results for input(s): CKTOTAL, CKMB, CKMBINDEX, TROPONINI in the last 72 hours. BNP: No results for input(s): PROBNP in the last 72 hours. D-Dimer: No results for input(s): DDIMER in the last 72 hours. CBG: No results for input(s): GLUCAP in the last 72 hours. Hemoglobin A1C: No results for input(s): HGBA1C in the last 72 hours. Fasting Lipid  Panel: No results for input(s): CHOL, HDL, LDLCALC, TRIG, CHOLHDL, LDLDIRECT in the last 72 hours. Thyroid Function Tests: No results for input(s): TSH, T4TOTAL, FREET4, T3FREE, THYROIDAB in the last 72 hours. Anemia Panel: No results for input(s): VITAMINB12, FOLATE, FERRITIN, TIBC, IRON, RETICCTPCT in the last 72 hours. Coagulation: No results for input(s): LABPROT, INR in the last 72 hours. Urine Drug Screen: Drugs of Abuse  No results found for: LABOPIA, COCAINSCRNUR, LABBENZ, AMPHETMU, THCU, LABBARB  Alcohol Level: No results for input(s): ETH in the last 72 hours. Urinalysis: No results for input(s): COLORURINE, LABSPEC, PHURINE, GLUCOSEU, HGBUR, BILIRUBINUR, KETONESUR, PROTEINUR, UROBILINOGEN, NITRITE, LEUKOCYTESUR in the last 72 hours.  Invalid input(s): APPERANCEUR Misc. Labs: Bulla fluid wound culture: Pending  Imaging results:  Dg Tibia/fibula Left  04/13/2015   CLINICAL DATA:  Left lower extremity acute swelling for 2 days, extremity cellulitis, diabetes  EXAM: LEFT TIBIA AND FIBULA - 2 VIEW  COMPARISON:  None.  FINDINGS: Left lower extremity subcutaneous edema evident. Soft tissue wound noted of the medial high left ankle. Left tibia and fibula appear intact. No acute osseous finding or bony destruction. No plain film evidence of osteomyelitis. Soft tissue calcification noted. 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  IMPRESSION: Soft tissue swelling.  No acute osseous finding.   Electronically Signed   By: Judie Petit.   Shick M.D.   On: 04/13/2015 13:36    Other results: EKG:  Assessment & Plan by Problem: Principal Problem:   Cellulitis of left leg Active Problems:   Hypertension   Prediabetes  Mr. Clayburn is a 54 yo male with DMII and HTN, presenting with 3 day h/o LLE bullous cellulitis.  Bullous cellulitis: Swelling, erythema, and bulla formation.  Previously fevers, chills, and dizziness, but now no complaints and normotensive.  H/o bullous cellulitis.  No concern for necrotizing fasciitis, gas gangrene, or staph toxic shock.  No evidence of osteo on plain film.  No h/o DVT or complaints of calf pain or SOB.  As patient reports initial systemic symptoms, there is concern for transient bacteremia.  Blood cultures drawn in ED.  Wound culture obtained from bullous fluid.  He is s/p Clindamycin 900 mg IV in ED.  Will switch to Cefazolin IV.  Will consider MRI if no response to Cefazolin. - Cefazolin, per pharmacy  Wound culture  Blood culture  DMII: Last A1c in 2015 (6%). He admits poor adherence to medications and diet.  He does not check his blood sugar.  Hold metformin - SSI-M  A1c  HTN: stable, good control. - Lisinopril 20 mg daily - Amlodipine 5 mg daily  FEN/GI: - Carb modified  DVT Ppx: Lovenox  Dispo: Disposition is deferred at this time, awaiting improvement of current medical problems. Anticipated discharge in approximately 3-4 day(s).   The patient does have a current PCP Ambrose Finland, NP) and does need an [facility] hospital follow-up appointment after discharge.  The patient does not have transportation limitations that hinder transportation to clinic appointments.  Signed: Jana Half, MD, PhD 04/13/2015, 4:15 PM  Medicine attending admission note: I have personally interviewed and examined this patient and reviewed pertinent clinical, laboratory, and x-ray data, and I attest to the accuracy of the evaluation and management plan as recorded by resident physician Dr.  Venia Minks except for any changes that might be outlined below.  Clinical summary: 54 year old man with hypertension and type 2 diabetes on oral agent. He had a previous admission one  year ago in September 2015 for extensive cellulitis of his left lower extremity. He presented this time with recurrent, extensive cellulitis of the left lower extremity with bulla formation. He has noted progressive erythema, swelling, and blister formation over the last 3 days. Subjective fevers, rigors,  and intermittent lightheadedness.  Initial exam by Dr. Ladona Ridgel: Blood pressure 123/72, pulse 79, temperature 98.8 F (37.1 C), temperature source Oral, resp. rate 22, height  (1.778 m), weight 357 lb 7 oz (162.133 kg), SpO2 98 %. Large abdomen  Full ROM of left foot and knee. No pain with active or passive extension.  Proprioception of left big toe intact.  Large area of poorly defined erythema and nonpitting swelling. Overlying skin warm to touch. Multiple coalescing bullae present within erythema. No lymphangitic streaking. No crepitus.   Current exam: He does not appear toxic Blood pressure 118/53, pulse 86, temperature 98.8 F (37.1 C), temperature source Oral, resp. rate 14, height  (1.778 m), weight 361 lb 1.8 oz (163.8 kg), SpO2 95 %./ No change compared with the exam by Dr. Ladona Ridgel except one of the larger bulla was broken open to obtain culture material. Calf is nontender. Dorsalis pedis pulse 2+.  Pertinent lab: White count 8600 Random glucose 131  Regular x-rays: Soft tissue swelling. No evidence for bone involvement.  Impression: Recurrent, idiopathic, extensive cellulitis left lower extremity.  Plan: Cultures were obtained. One dose of clindamycin given in the emergency room. We have started him on parenteral cefazolin. Monitor glucose. Insulin as needed.

## 2015-04-13 NOTE — ED Provider Notes (Signed)
CSN: [id number]     Arrival date & time 04/13/15  1206 History   First MD Initiated Contact with Patient 04/13/15 1212     Chief Complaint  Patient presents with  . Leg Swelling     (Consider location/radiation/quality/duration/timing/severity/associated sxs/prior Treatment) Patient is a 54 y.o. male presenting with rash. The history is provided by the patient.  Rash Location:  Leg Leg rash location:  L lower leg Quality: blistering, painful and redness   Pain details:    Quality:  Aching   Severity:  Moderate   Onset quality:  Sudden   Duration:  4 days   Timing:  Constant   Progression:  Worsening Severity:  Moderate Onset quality:  Sudden Duration:  4 days Timing:  Constant Progression:  Worsening Chronicity:  Recurrent Relieved by:  Nothing Worsened by:  Nothing tried Ineffective treatments:  None tried Associated symptoms: fever   Associated symptoms: no abdominal pain, no diarrhea, no headaches, no joint pain, no myalgias, no shortness of breath and not vomiting    54 yo M with a chief complaint of left lower extremity swelling and pain. This is a recurrent issue for this patient. He was recently admitted back in November for a similar issue. Patient states that it started with a fever after he had not taken his diabetes medicine for a few days. Patient had resolution of the fever but increased swelling and erythema to the left lower extremity. Bulla formation. Patient feels that his blood sugars have been normal recently.  Past Medical History  Diagnosis Date  . Hypertension   . Skin infection   . Obesity   . Bronchitis   . Cellulitis and abscess of lower extremity 04/11/2015   No past surgical history on file. Family History  Problem Relation Age of Onset  . Diabetes Mother   . Alcohol abuse Father   . Hypertension Brother   . Asthma Sister    Social History  Substance Use Topics  . Smoking status: Never Smoker   . Smokeless tobacco: Never Used  .  Alcohol Use: Yes     Comment: occasionally    Review of Systems  Constitutional: Positive for fever. Negative for chills.  HENT: Negative for congestion and facial swelling.   Eyes: Negative for discharge and visual disturbance.  Respiratory: Negative for shortness of breath.   Cardiovascular: Negative for chest pain and palpitations.  Gastrointestinal: Negative for vomiting, abdominal pain and diarrhea.  Musculoskeletal: Negative for myalgias and arthralgias.  Skin: Negative for color change and rash.  Neurological: Negative for tremors, syncope and headaches.  Psychiatric/Behavioral: Negative for confusion and dysphoric mood.      Allergies  Oxycodone  Home Medications   Prior to Admission medications   Medication Sig Start Date End Date Taking? Authorizing Provider  acetaminophen (TYLENOL) 500 MG tablet Take 500 mg by mouth every 6 (six) hours as needed for fever. 04/06/14  Yes Fayrene Helper, PA-C  amLODipine (NORVASC) 5 MG tablet Take 1 tablet (5 mg total) by mouth daily. 11/20/14  Yes Ambrose Finland, NP  lisinopril (PRINIVIL,ZESTRIL) 20 MG tablet Take 1 tablet (20 mg total) by mouth daily. 11/20/14  Yes Ambrose Finland, NP  metFORMIN (GLUCOPHAGE) 500 MG tablet Take 1 tablet (500 mg total) by mouth daily with breakfast. 12/21/14  Yes Ambrose Finland, NP  naproxen sodium (ANAPROX) 220 MG tablet Take 220 mg by mouth 2 (two) times daily with a meal.   Yes Historical Provider, MD  chlorpheniramine-HYDROcodone (TUSSIONEX PENNKINETIC  ER) 10-8 MG/5ML SUER Take 5 mLs by mouth every 12 (twelve) hours as needed for cough. 01/09/15   Garlon Hatchet, PA-C  HYDROcodone-acetaminophen (NORCO/VICODIN) 5-325 MG per tablet Take 1-2 tablets every 6 hours as needed for severe pain 11/06/14   Renne Crigler, PA-C  ketorolac (TORADOL) 10 MG tablet Take 1 tablet (10 mg total) by mouth every 6 (six) hours as needed. 04/15/14   Kathlen Mody, MD  predniSONE (DELTASONE) 20 MG tablet Take 2 tablets (40 mg total) by  mouth daily. Take 40 mg by mouth daily for 3 days, then  by mouth daily for 3 days, then  daily for 3 days 01/09/15   Garlon Hatchet, PA-C   BP 123/72 mmHg  Pulse 79  Temp(Src) 98.8 F (37.1 C) (Oral)  Resp 22  Ht  (1.778 m)  Wt 357 lb 7 oz (162.133 kg)  BMI 51.29 kg/m2  SpO2 98% Physical Exam  Constitutional: He is oriented to person, place, and time. He appears well-developed and well-nourished.  HENT:  Head: Normocephalic and atraumatic.  Eyes: EOM are normal. Pupils are equal, round, and reactive to light.  Neck: Normal range of motion. Neck supple. No JVD present.  Cardiovascular: Normal rate and regular rhythm.  Exam reveals no gallop and no friction rub.   No murmur heard. Pulmonary/Chest: No respiratory distress. He has no wheezes.  Abdominal: He exhibits no distension. There is no rebound and no guarding.  Musculoskeletal: Normal range of motion. He exhibits edema.  4+ edema to the left lower extremity below the knee. Erythema and bullae noted. Pulse motor and sensation intact distally.  Neurological: He is alert and oriented to person, place, and time.  Skin: No rash noted. No pallor.  Psychiatric: He has a normal mood and affect. His behavior is normal.    ED Course  Procedures (including critical care time) Labs Review Labs Reviewed  CBC WITH DIFFERENTIAL/PLATELET - Abnormal; Notable for the following:    Hemoglobin 12.8 (*)    HCT 38.4 (*)    Platelets 112 (*)    All other components within normal limits  COMPREHENSIVE METABOLIC PANEL - Abnormal; Notable for the following:    Glucose, Bld 131 (*)    Albumin 2.9 (*)    All other components within normal limits  CULTURE, BLOOD (ROUTINE X 2)  CULTURE, BLOOD (ROUTINE X 2)    Imaging Review Dg Tibia/fibula Left  04/13/2015   CLINICAL DATA:  Left lower extremity acute swelling for 2 days, extremity cellulitis, diabetes  EXAM: LEFT TIBIA AND FIBULA - 2 VIEW  COMPARISON:  None.  FINDINGS: Left lower  extremity subcutaneous edema evident. Soft tissue wound noted of the medial high left ankle. Left tibia and fibula appear intact. No acute osseous finding or bony destruction. No plain film evidence of osteomyelitis. Soft tissue calcification noted. 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  IMPRESSION: Soft tissue swelling.  No acute osseous finding.   Electronically Signed   By: Judie Petit.  Shick M.D.   On: 04/13/2015 13:36   I have personally reviewed and evaluated these images and lab results as part of my medical decision-making.   EKG Interpretation None      MDM   Final diagnoses:  Cellulitis of left leg    54 yo M with a chief complaint of cellulitis. This recurrent issue for him. Fever is erythema. Bullae noted concern for possible hemorrhagic bullae will obtain an x-ray. No noted crepitus on exam.  Given clindamycin. Likely need admission.  The patients results and plan were reviewed and discussed.   Any x-rays performed were independently reviewed by myself.   Differential diagnosis were considered with the presenting HPI.  Medications  clindamycin (CLEOCIN) IVPB 900 mg (0 mg Intravenous Stopped 04/13/15 1400)    Filed Vitals:   04/13/15 1300 04/13/15 1401 04/13/15 1430 04/13/15 1500  BP: 123/63 119/66 122/61 123/72  Pulse: 88 97 78 79  Temp:      TempSrc:      Resp: Height:      Weight:      SpO2: 98% 98% 98% 98%    Final diagnoses:  Cellulitis of left leg    Admission/ observation were discussed with the admitting physician, patient and/or family and they are comfortable with the plan.    Melene Plan, DO 04/13/15 [phone]

## 2015-04-13 NOTE — ED Notes (Signed)
Admitting at bedside 

## 2015-04-14 LAB — CBC WITH DIFFERENTIAL/PLATELET
BASOS ABS: 0 10*3/uL (ref 0.0–0.1)
BASOS PCT: 0 %
EOS ABS: 0.1 10*3/uL (ref 0.0–0.7)
EOS PCT: 1 %
HCT: 37 % — ABNORMAL LOW (ref 39.0–52.0)
Hemoglobin: 12.3 g/dL — ABNORMAL LOW (ref 13.0–17.0)
Lymphocytes Relative: 32 %
Lymphs Abs: 2.2 10*3/uL (ref 0.7–4.0)
MCH: 30 pg (ref 26.0–34.0)
MCHC: 33.2 g/dL (ref 30.0–36.0)
MCV: 90.2 fL (ref 78.0–100.0)
Monocytes Absolute: 0.7 10*3/uL (ref 0.1–1.0)
Monocytes Relative: 10 %
Neutro Abs: 3.9 10*3/uL (ref 1.7–7.7)
Neutrophils Relative %: 57 %
PLATELETS: 136 10*3/uL — AB (ref 150–400)
RBC: 4.1 MIL/uL — AB (ref 4.22–5.81)
RDW: 13.9 % (ref 11.5–15.5)
WBC: 6.9 10*3/uL (ref 4.0–10.5)

## 2015-04-14 LAB — BASIC METABOLIC PANEL
ANION GAP: 7 (ref 5–15)
BUN: 11 mg/dL (ref 6–20)
CALCIUM: 8.8 mg/dL — AB (ref 8.9–10.3)
CO2: 27 mmol/L (ref 22–32)
Chloride: 104 mmol/L (ref 101–111)
Creatinine, Ser: 0.71 mg/dL (ref 0.61–1.24)
GFR calc Af Amer: >60 mL/min (ref >60)
Glucose, Bld: 88 mg/dL (ref 65–99)
POTASSIUM: 4.1 mmol/L (ref 3.5–5.1)
SODIUM: 138 mmol/L (ref 135–145)

## 2015-04-14 LAB — GLUCOSE, CAPILLARY
Glucose-Capillary: 104 mg/dL — ABNORMAL HIGH (ref 65–99)
Glucose-Capillary: 102 mg/dL — ABNORMAL HIGH (ref 65–99)
Glucose-Capillary: 96 mg/dL (ref 65–99)
Glucose-Capillary: 116 mg/dL — ABNORMAL HIGH (ref 65–99)

## 2015-04-14 LAB — LIPID PANEL
CHOL/HDL RATIO: 6.4 ratio
Cholesterol: 187 mg/dL (ref 0–200)
HDL: 29 mg/dL — AB (ref >40)
LDL CALC: 128 mg/dL — AB (ref 0–99)
Triglycerides: 151 mg/dL — ABNORMAL HIGH (ref <150)
VLDL: 30 mg/dL (ref 0–40)

## 2015-04-14 MED ORDER — ENOXAPARIN SODIUM 80 MG/0.8ML ~~LOC~~ SOLN
80.0000 mg | SUBCUTANEOUS | Status: DC
  Administered 2015-04-14: 80 mg via SUBCUTANEOUS
  Filled 2015-04-14: qty 0.8

## 2015-04-14 NOTE — Progress Notes (Addendum)
Inpatient Diabetes Program Recommendations  AACE/ADA: New Consensus Statement on Inpatient Glycemic Control (2015)  Target Ranges:  Prepandial:   less than 140 mg/dL      Peak postprandial:   less than 180 mg/dL (1-2 hours)      Critically ill patients:  140 - 180 mg/dL   Review of Glycemic Control  Consult from RN stating pt needs glucometer and Diet education  Inpatient Diabetes Program Recommendations:   Noted Patient has lack of insurance and will need to get the [facility] store brand glucose meter ($15) and strips (&9 for 50). The DM coordinators do not keep meters due to the strips expiring. Patient does not need diet education at this time due to home glucose control and his A1c being 6%.  Thanks,  Christena Deem RN, MSN, [facility] Inpatient Diabetes Coordinator Team Pager [phone] (8a-5p)

## 2015-04-14 NOTE — Evaluation (Signed)
Physical Therapy Evaluation Patient Details Name: [name] MRN: [id number] DOB: 04-14-1961 Today's Date: 04/14/2015   History of Present Illness  Mr. Roker is a 54 yo male with DMII and HTN, presenting with 3 day h/o LLE swelling, fever, dizziness, and HA.  Clinical Impression  Patient evaluated by Physical Therapy with no further acute PT needs identified. All education has been completed and the patient has no further questions. Pt instructed to ambulate in hall 3-5x/ day distance tolerated.  See below for any follow-up Physial Therapy or equipment needs. PT is signing off. Thank you for this referral.     Follow Up Recommendations No PT follow up    Equipment Recommendations  None recommended by PT    Recommendations for Other Services       Precautions / Restrictions Precautions Precautions: None Restrictions Weight Bearing Restrictions: No      Mobility  Bed Mobility Overal bed mobility: Independent                Transfers Overall transfer level: Independent Equipment used: None                Ambulation/Gait Ambulation/Gait assistance: Modified independent (Device/Increase time) Ambulation Distance (Feet): 250 Feet Assistive device: None Gait Pattern/deviations: WFL(Within Functional Limits) Gait velocity: WFL Gait velocity interpretation: at or above normal speed for age/gender General Gait Details: gait WFL  Stairs Stairs: Yes Stairs assistance: Modified independent (Device/Increase time) Stair Management: One rail Right;Alternating pattern;Forwards Number of Stairs: 4 General stair comments: only 4 stairs due to IV, no issues with LE control on stairs  Wheelchair Mobility    Modified Rankin (Stroke Patients Only)       Balance Overall balance assessment: No apparent balance deficits (not formally assessed)                                           Pertinent Vitals/Pain Pain Assessment: No/denies pain  VSS     Home Living Family/patient expects to be discharged to:: Private residence Living Arrangements: Spouse/significant other Available Help at Discharge: Friend(s);Available 24 hours/day Type of Home: House       Home Layout: Two level Home Equipment: Walker - 2 wheels Additional Comments: has not had to use RW recently    Prior Function Level of Independence: Independent         Comments: works at [employer] in [place], on feet most of day. Lost 189 lbs at [employer] but has not been lately, planning to return when he gets out of hospital     Hand Dominance        Extremity/Trunk Assessment   Upper Extremity Assessment: Overall WFL for tasks assessed           Lower Extremity Assessment: LLE deficits/detail   LLE Deficits / Details: swelling and trophic changes to distal LE, not painful at this point and ROM and strength WFL  Cervical / Trunk Assessment: Normal  Communication   Communication: No difficulties  Cognition Arousal/Alertness: Awake/alert Behavior During Therapy: WFL for tasks assessed/performed Overall Cognitive Status: Within Functional Limits for tasks assessed                      General Comments General comments (skin integrity, edema, etc.): discussed activity level and proper nutrition upon d/c    Exercises General Exercises - Lower Extremity Ankle Circles/Pumps: AROM;Both;15 reps;Seated  Long Arc Quad: AROM;Both;10 reps;Seated      Assessment/Plan    PT Assessment Patent does not need any further PT services  PT Diagnosis Difficulty walking   PT Problem List    PT Treatment Interventions     PT Goals (Current goals can be found in the Care Plan section) Acute Rehab PT Goals Patient Stated Goal: return home and work ASAP PT Goal Formulation: All assessment and education complete, DC therapy    Frequency     Barriers to discharge        Co-evaluation               End of Session   Activity Tolerance: Patient  tolerated treatment well Patient left: in chair;with call bell/phone within reach Nurse Communication: Mobility status    Functional Assessment Tool Used: clinical judgement Functional Limitation: Mobility: Walking and moving around Mobility: Walking and Moving Around Current Status [phone]): 0 percent impaired, limited or restricted Mobility: Walking and Moving Around Goal Status [phone]): 0 percent impaired, limited or restricted Mobility: Walking and Moving Around Discharge Status [phone]): 0 percent impaired, limited or restricted    Time: 1044-1106 PT Time Calculation (min) (ACUTE ONLY): 22 min   Charges:   PT Evaluation $Initial PT Evaluation Tier I: 1 Procedure     PT G Codes:   PT G-Codes **NOT FOR INPATIENT CLASS** Functional Assessment Tool Used: clinical judgement Functional Limitation: Mobility: Walking and moving around Mobility: Walking and Moving Around Current Status (O1308): 0 percent impaired, limited or restricted Mobility: Walking and Moving Around Goal Status (M5784): 0 percent impaired, limited or restricted Mobility: Walking and Moving Around Discharge Status [phone]): 0 percent impaired, limited or restricted   Lyanne Co, PT  Acute Rehab Services  [phone]  Lyanne Co 04/14/2015, 11:58 AM

## 2015-04-14 NOTE — Progress Notes (Signed)
Subjective: [name]. Denied pain, fevers, or chills.  He has no complaints today.  Objective: Vital signs in last 24 hours: Filed Vitals:   04/13/15 1800 04/13/15 1851 04/13/15 2131 04/14/15 0453  BP: 126/70 128/67 121/69 118/53  Pulse: 73 84 77 86  Temp:  98.5 F (36.9 C) 97.9 F (36.6 C) 98.8 F (37.1 C)  TempSrc:    Oral  Resp: Height:  (1.778 m)     Weight: 163.8 kg (361 lb 1.8 oz)     SpO2: 98% 100% 100% 95%   Weight change:  No intake or output data in the 24 hours ending 04/14/15 0854  Physical Exam  Constitutional: He is oriented to person, place, and time and well-developed, well-nourished, and in no distress.  Obese male, lying in bed, NAD  HENT:  Head: Normocephalic and atraumatic.  Eyes: EOM are normal.  Cardiovascular: Normal rate, regular rhythm, normal heart sounds and intact distal pulses.   Pulmonary/Chest: Effort normal and breath sounds normal. No respiratory distress. He has no wheezes.  Abdominal: Soft. He exhibits no distension. There is no tenderness. There is no rebound and no guarding.  Musculoskeletal:  Spontaneous movement of LLE  Neurological: He is alert and oriented to person, place, and time.  Skin: Skin is warm and dry.  Erythema and warmth, not extending to previously drawn line.  Bullae present.  Nonpitting edema.    Lab Results: Basic Metabolic Panel:  Recent Labs Lab 04/13/15 1254 04/14/15 0439  NA 137 138  K 3.8 4.1  CL 104 104  CO2 25 27  GLUCOSE 131* 88  BUN 17 11  CREATININE 0.78 0.71  CALCIUM 9.0 8.8*   Liver Function Tests:  Recent Labs Lab 04/13/15 1254  AST 33  ALT 28  ALKPHOS 58  BILITOT 0.6  PROT 6.9  ALBUMIN 2.9*   No results for input(s): LIPASE, AMYLASE in the last 168 hours. No results for input(s): AMMONIA in the last 168 hours. CBC:  Recent Labs Lab 04/13/15 1254 04/14/15 0439  WBC 8.6 6.9  NEUTROABS 6.3 3.9  HGB 12.8* 12.3*  HCT 38.4* 37.0*  MCV 89.3 90.2  PLT 112*  136*   Cardiac Enzymes: No results for input(s): CKTOTAL, CKMB, CKMBINDEX, TROPONINI in the last 168 hours. BNP: No results for input(s): PROBNP in the last 168 hours. D-Dimer: No results for input(s): DDIMER in the last 168 hours. CBG:  Recent Labs Lab 04/13/15 2131 04/14/15 0800  GLUCAP 130* 104*   Hemoglobin A1C: No results for input(s): HGBA1C in the last 168 hours. Fasting Lipid Panel:  Recent Labs Lab 04/14/15 0439  CHOL 187  HDL 29*  LDLCALC 128*  TRIG 151*  CHOLHDL 6.4   Thyroid Function Tests: No results for input(s): TSH, T4TOTAL, FREET4, T3FREE, THYROIDAB in the last 168 hours. Coagulation: No results for input(s): LABPROT, INR in the last 168 hours. Anemia Panel: No results for input(s): VITAMINB12, FOLATE, FERRITIN, TIBC, IRON, RETICCTPCT in the last 168 hours. Urine Drug Screen: Drugs of Abuse  No results found for: LABOPIA, COCAINSCRNUR, LABBENZ, AMPHETMU, THCU, LABBARB  Alcohol Level: No results for input(s): ETH in the last 168 hours. Urinalysis: No results for input(s): COLORURINE, LABSPEC, PHURINE, GLUCOSEU, HGBUR, BILIRUBINUR, KETONESUR, PROTEINUR, UROBILINOGEN, NITRITE, LEUKOCYTESUR in the last 168 hours.  Invalid input(s): APPERANCEUR Misc. Labs:   Micro Results: No results found for this or any previous visit (from the past 240 hour(s)). Studies/Results: Dg Tibia/fibula Left  04/13/2015   CLINICAL  DATA:  Left lower extremity acute swelling for 2 days, extremity cellulitis, diabetes  EXAM: LEFT TIBIA AND FIBULA - 2 VIEW  COMPARISON:  None.  FINDINGS: Left lower extremity subcutaneous edema evident. Soft tissue wound noted of the medial high left ankle. Left tibia and fibula appear intact. No acute osseous finding or bony destruction. No plain film evidence of osteomyelitis. Soft tissue calcification noted. 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  IMPRESSION: Soft tissue swelling.  No acute osseous finding.    Electronically Signed   By: Judie Petit.  Shick M.D.   On: 04/13/2015 13:36   Medications: I have reviewed the patient's current medications. Scheduled Meds: . amLODipine  5 mg Oral Daily  .  ceFAZolin (ANCEF) IV  2 g Intravenous 3 times per day  . enoxaparin (LOVENOX) injection  40 mg Subcutaneous Q24H  . insulin aspart  0-15 Units Subcutaneous TID WC  . insulin aspart  0-5 Units Subcutaneous QHS  . lisinopril  20 mg Oral Daily   Continuous Infusions:  PRN Meds:.acetaminophen Assessment/Plan: Principal Problem:   Cellulitis of left leg Active Problems:   Hypertension   Prediabetes   Cellulitis and abscess of leg  [name] is a 54 yo male with DMII and HTN, presenting with 3 day h/o LLE bullous cellulitis.  Bullous cellulitis: Improved.  Blood cultures drawn in ED. Wound culture obtained from bullous fluid. He is s/p Clindamycin 900 mg IV in ED. Will switch to Cefazolin IV. Will consider MRI if no response to Cefazolin. - Cefazolin, per pharmacy (10/1 - ?)  Wound culture  Blood culture  DMII: Last A1c in 2015 (6%). He admits poor adherence to medications and diet. He does not check his blood sugar. Hold metformin - SSI-M  A1c  HTN: stable, good control. Will consider starting statin on discharge. - Lisinopril 20 mg daily - Amlodipine 5 mg daily  FEN/GI: - Carb modified  DVT Ppx: Lovenox  Dispo: Disposition is deferred at this time, awaiting improvement of current medical problems. Anticipated discharge in approximately 2-3 day(s).   The patient does have a current PCP Christopher Finland, NP) and does need an [facility] hospital follow-up appointment after discharge.  The patient does not have transportation limitations that hinder transportation to clinic appointments..Services Needed at time of discharge: Y = Yes, Blank = No PT:    OT:   RN:   Equipment:   Other:     Jana Half, MD 04/14/2015, 8:54 AM

## 2015-04-15 ENCOUNTER — Encounter (HOSPITAL_COMMUNITY): Payer: Self-pay | Admitting: *Deleted

## 2015-04-15 LAB — CBC WITH DIFFERENTIAL/PLATELET
BASOS ABS: 0.1 10*3/uL (ref 0.0–0.1)
Basophils Relative: 1 %
Eosinophils Absolute: 0.1 10*3/uL (ref 0.0–0.7)
Eosinophils Relative: 1 %
HCT: 36.8 % — ABNORMAL LOW (ref 39.0–52.0)
HEMOGLOBIN: 12 g/dL — AB (ref 13.0–17.0)
LYMPHS ABS: 2.6 10*3/uL (ref 0.7–4.0)
LYMPHS PCT: 34 %
MCH: 29.4 pg (ref 26.0–34.0)
MCHC: 32.6 g/dL (ref 30.0–36.0)
MCV: 90.2 fL (ref 78.0–100.0)
Monocytes Absolute: 0.7 10*3/uL (ref 0.1–1.0)
Monocytes Relative: 10 %
NEUTROS PCT: 54 %
Neutro Abs: 4.1 10*3/uL (ref 1.7–7.7)
PLATELETS: 160 10*3/uL (ref 150–400)
RBC: 4.08 MIL/uL — AB (ref 4.22–5.81)
RDW: 13.6 % (ref 11.5–15.5)
WBC: 7.5 10*3/uL (ref 4.0–10.5)

## 2015-04-15 LAB — BASIC METABOLIC PANEL
ANION GAP: 5 (ref 5–15)
BUN: 7 mg/dL (ref 6–20)
CHLORIDE: 101 mmol/L (ref 101–111)
CO2: 29 mmol/L (ref 22–32)
Calcium: 8.8 mg/dL — ABNORMAL LOW (ref 8.9–10.3)
Creatinine, Ser: 0.75 mg/dL (ref 0.61–1.24)
GFR calc Af Amer: >60 mL/min (ref >60)
GLUCOSE: 95 mg/dL (ref 65–99)
POTASSIUM: 3.9 mmol/L (ref 3.5–5.1)
SODIUM: 135 mmol/L (ref 135–145)

## 2015-04-15 LAB — HEMOGLOBIN A1C
HEMOGLOBIN A1C: 6.2 % — AB (ref 4.8–5.6)
Mean Plasma Glucose: 131 mg/dL

## 2015-04-15 LAB — GLUCOSE, CAPILLARY
Glucose-Capillary: 90 mg/dL (ref 65–99)
Glucose-Capillary: 94 mg/dL (ref 65–99)

## 2015-04-15 MED ORDER — PNEUMOCOCCAL VAC POLYVALENT 25 MCG/0.5ML IJ INJ: 0.5000 mL | INJECTION | INTRAMUSCULAR | Status: DC

## 2015-04-15 MED ORDER — CEPHALEXIN 500 MG PO CAPS: 500.0000 mg | ORAL_CAPSULE | Freq: Four times a day (QID) | ORAL | Status: DC

## 2015-04-15 MED ORDER — CEPHALEXIN 500 MG PO CAPS
500.0000 mg | ORAL_CAPSULE | Freq: Four times a day (QID) | ORAL | Status: DC
  Administered 2015-04-15: 500 mg via ORAL
  Filled 2015-04-15: qty 1

## 2015-04-15 NOTE — Discharge Summary (Signed)
Name: [name] MRN: [id number] DOB: 1960-10-08 54 y.o. PCP: Ambrose Finland, NP  Date of Admission: 04/13/2015 12:11 PM Date of Discharge: 04/15/2015 Attending Physician: Cliffton Asters, MD  Discharge Diagnosis: 1. Bullous cellulitis   Principal Problem:   Cellulitis of left leg Active Problems:   Hypertension   Prediabetes   Cellulitis and abscess of leg  Discharge Medications:   Medication List    STOP taking these medications        predniSONE 20 MG tablet  Commonly known as:  DELTASONE      TAKE these medications        acetaminophen 500 MG tablet  Commonly known as:  TYLENOL  Take 500 mg by mouth every 6 (six) hours as needed for fever.     amLODipine 5 MG tablet  Commonly known as:  NORVASC  Take 1 tablet (5 mg total) by mouth daily.     cephALEXin 500 MG capsule  Commonly known as:  KEFLEX  Take 1 capsule (500 mg total) by mouth 4 (four) times daily.     chlorpheniramine-HYDROcodone 10-8 MG/5ML Suer  Commonly known as:  TUSSIONEX PENNKINETIC ER  Take 5 mLs by mouth every 12 (twelve) hours as needed for cough.     HYDROcodone-acetaminophen 5-325 MG tablet  Commonly known as:  NORCO/VICODIN  Take 1-2 tablets every 6 hours as needed for severe pain     ketorolac 10 MG tablet  Commonly known as:  TORADOL  Take 1 tablet (10 mg total) by mouth every 6 (six) hours as needed.     lisinopril 20 MG tablet  Commonly known as:  PRINIVIL,ZESTRIL  Take 1 tablet (20 mg total) by mouth daily.     metFORMIN 500 MG tablet  Commonly known as:  GLUCOPHAGE  Take 1 tablet (500 mg total) by mouth daily with breakfast.     naproxen sodium 220 MG tablet  Commonly known as:  ANAPROX  Take 220 mg by mouth 2 (two) times daily with a meal.        Disposition and follow-up:   ChristopherVirginia L Casagrande was discharged from [facility] in Stable condition.  At the hospital follow up visit please address:  1.  Completion of antibiotics, improvement in LLE  cellulitis, medication adherence and dosing (diabetes and HTN)   2.  Labs / imaging needed at time of follow-up: none  3.  Pending labs/ test needing follow-up: A1c  Follow-up Appointments:     Follow-up Information    Follow up with [facility]     On 04/23/2015.   Why:  2pm. Please bring photo ID.   Contact information:   [address] [place] [address] [phone]      Discharge Instructions: Discharge Instructions    Call MD for:  temperature >100.4    Complete by:  As directed      Call MD for:    Complete by:  As directed   Worsening infection and redness extending beyond currently drawn border.     Diet - low sodium heart healthy    Complete by:  As directed      Discharge wound care:    Complete by:  As directed   Keep leg clean and dry.  May apply vaseline as needed for dryness.  Avoid antibiotic ointments.     Increase activity slowly    Complete by:  As directed  Consultations:    Procedures Performed:  Dg Tibia/fibula Left  04/13/2015   CLINICAL DATA:  Left lower extremity acute swelling for 2 days, extremity cellulitis, diabetes  EXAM: LEFT TIBIA AND FIBULA - 2 VIEW  COMPARISON:  None.  FINDINGS: Left lower extremity subcutaneous edema evident. Soft tissue wound noted of the medial high left ankle. Left tibia and fibula appear intact. No acute osseous finding or bony destruction. No plain film evidence of osteomyelitis. Soft tissue calcification noted. 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  IMPRESSION: Soft tissue swelling.  No acute osseous finding.   Electronically Signed   By: Judie Petit.  Shick M.D.   On: 04/13/2015 13:36    2D Echo: none  Cardiac Cath: none  Admission HPI: [name] is a 54 yo male with DMII and HTN, presenting with 3 day h/o LLE swelling, fever, dizziness, and HA. Patient reports symptoms started Wednesday night. He had not been on his  medications (Lisinopril, Amlodipine, Metformin). He first noticed nonpitting leg swelling following a large breakfast at [facility]. He then became dizzy, with HA, fevers and chills Thursday evening. Symptoms were moderately controlled with tylenol and aleve. He denies leg pain at that time, but says it was just tense/swollen. By Friday, his fever and chills had resolved, but his leg was becoming increasingly swollen and puffy. Saturday morning he noticed it start to blister and he presented to the ED. He has been ambulate since he first noticed the swelling. He denies previous wound to the affected area. He has a h/o bullous cellulitis in Sept 2016 treated with Cefazolin and then Keflex. He currently denies F/C, HA, or dizziness. He denies leg pain at rest or with movement of the foot or knee. He denies calf pain, CP, or SOB. He denies N/V, C/D, or dysuria.   He has no history of DVT or recent antibiotic use.   He does not check his blood sugar, as he does not have a glucometer. He admits poor adherence to diabetic diet.  Hospital Course by problem list: Principal Problem:   Cellulitis of left leg Active Problems:   Hypertension   Prediabetes   Cellulitis and abscess of leg   1. Bullous cellulitis: Patient presented without pain, fevers, or chills.  Other than swelling and erythema, patient continued to deny pain.  He was given 48 hours of Cefazolin 2 g IV with improvement in erythema.  He was able to ambulate with pain or dyspnea.  He was transitioned to Cephalexin PO, and discharged on Cephalexin 500 mg PO QID for 5 days (7 days total antibiotic therapy).  2. DMII: Patient blood sugars stable on carb modified diet. No insulin necessary. Patient was discharged on home Metformin.    3: HTN: Stable throughout hospitalization on home medications.  Discharge Vitals:   BP 155/86 mmHg  Pulse 86  Temp(Src) 98.6 F (37 C) (Oral)  Resp 20  Ht  (1.778 m)  Wt 163.8 kg (361 lb 1.8  oz)  BMI 51.81 kg/m2  SpO2 99%  Discharge Labs:  Results for orders placed or performed during the hospital encounter of 04/13/15 (from the past 24 hour(s))  Glucose, capillary     Status: None   Collection Time: 04/14/15  5:03 PM  Result Value Ref Range   Glucose-Capillary 96 65 - 99 mg/dL  Glucose, capillary     Status: Abnormal   Collection Time: 04/14/15  9:08 PM  Result Value Ref Range   Glucose-Capillary 116 (H)  65 - 99 mg/dL   Comment 1 Notify RN    Comment 2 Document in Chart   CBC with Differential/Platelet     Status: Abnormal   Collection Time: 04/15/15  7:24 AM  Result Value Ref Range   WBC 7.5 4.0 - 10.5 K/uL   RBC 4.08 (L) 4.22 - 5.81 MIL/uL   Hemoglobin 12.0 (L) 13.0 - 17.0 g/dL   HCT 09.8 (L) 11.9 - 14.7 %   MCV 90.2 78.0 - 100.0 fL   MCH 29.4 26.0 - 34.0 pg   MCHC 32.6 30.0 - 36.0 g/dL   RDW 82.9 56.2 - 13.0 %   Platelets 160 150 - 400 K/uL   Neutrophils Relative % 54 %   Neutro Abs 4.1 1.7 - 7.7 K/uL   Lymphocytes Relative 34 %   Lymphs Abs 2.6 0.7 - 4.0 K/uL   Monocytes Relative 10 %   Monocytes Absolute 0.7 0.1 - 1.0 K/uL   Eosinophils Relative 1 %   Eosinophils Absolute 0.1 0.0 - 0.7 K/uL   Basophils Relative 1 %   Basophils Absolute 0.1 0.0 - 0.1 K/uL  Basic metabolic panel     Status: Abnormal   Collection Time: 04/15/15  7:24 AM  Result Value Ref Range   Sodium 135 135 - 145 mmol/L   Potassium 3.9 3.5 - 5.1 mmol/L   Chloride 101 101 - 111 mmol/L   CO2 29 22 - 32 mmol/L   Glucose, Bld 95 65 - 99 mg/dL   BUN 7 6 - 20 mg/dL   Creatinine, Ser 8.65 0.61 - 1.24 mg/dL   Calcium 8.8 (L) 8.9 - 10.3 mg/dL   GFR calc non Af Amer >60 >60 mL/min   GFR calc Af Amer >60 >60 mL/min   Anion gap 5 5 - 15  Glucose, capillary     Status: None   Collection Time: 04/15/15  8:12 AM  Result Value Ref Range   Glucose-Capillary 90 65 - 99 mg/dL  Glucose, capillary     Status: None   Collection Time: 04/15/15 11:49 AM  Result Value Ref Range   Glucose-Capillary  94 65 - 99 mg/dL    Signed: Jana Half, MD 04/15/2015, 1:28 PM    Services Ordered on Discharge: none Equipment Ordered on Discharge: none

## 2015-04-15 NOTE — Progress Notes (Signed)
Pharmacist Provided - Patient Medication Education Prior to Discharge   [name] is an 54 y.o. male who presented to [facility] on 04/13/2015 with a chief complaint of  Chief Complaint  Patient presents with  . Leg Swelling      Patient will be discharged with one new medication  Patient being discharged without any new medications  The following medications were discussed with the patient:   Pain Control medications:  Yes     No  Diabetes Medications:  Yes     No  Heart Failure Medications:  Yes     No  Anticoagulation Medications:   Yes     No  Antibiotics at discharge:  Yes     No  Allergy Assessment Completed and Updated:  Yes     No Identified Patient Allergies:  Allergies  Allergen Reactions  . Oxycodone Shortness Of Breath     Medication Adherence Assessment:  Excellent (no doses missed/week)       Good (1 dose missed/week)       Partial (2-3 doses missed/week)       Poor (>3 doses missed/week)  Barriers to Obtaining Medications:  Yes  No  [name] expressed no concern with obtaining medication.   Assessment: 54 y/o M with bullous cellulitis that has improved on cefazolin. Plans to discharge patient on Keflex po for 7 more days. Pt endorses extremely poor adherence with HTN and DM medications at home stating he would not take medications for weeks at a time because he "just didn't want to take them." Discussed extensively with pt about the importance of his medication adherence. Pt expressed a desire to stay out of the hospital and that he would be compliant with his medications. Also told me he was going to buy a blood pressure cuff.   Time spent preparing for discharge counseling: 15 min Time spent counseling patient: 20 min  [name], PharmD 04/15/2015, 12:40 PM

## 2015-04-15 NOTE — Progress Notes (Signed)
Patient states his ride will be here between 2-3pm for discharge.

## 2015-04-15 NOTE — Care Management Note (Addendum)
Case Management Note  Patient Details  Name: [name] MRN: [id number] Date of Birth: 25-Sep-1960  Subjective/Objective:               Date-04-15-15 Monday Initial Assessment Spoke with patient at the bedside.  Introduced self as Sports coach and explained role in discharge planning and how to be reached.  Verified patient lives at [address] [place], [place] [address], with fiance [name].  Verified patient anticipates to go home with fiance at time of discharge and will have part-time supervision at this time to best of their knowledge.  Patient has DME  walker  With 2 wheels. Expressed potential need for glucometer, referred patient to [facility] for lost cost glucometer and testing supplies.  Patient confirmed  needing help with high co-pays, but gets his medication through [facility] at this time.   Patient drives to MD appointments.  Verified patient has PCP at [facility]. Patient states they currently receive HH services through no one.    Plan: CM will continue to follow for discharge planning and [facility] resources.   Lawerance Sabal RN BSN CM [phone]      Action/Plan:  Patient for discharge today, follow up made at the [facility]. Patient notified verbally and appointment entered in AVS.  Expected Discharge Date:                  Expected Discharge Plan:  Home/Self Care  In-House Referral:     Discharge planning Services  CM Consult  Post Acute Care Choice:    Choice offered to:     DME Arranged:    DME Agency:     HH Arranged:    HH Agency:     Status of Service:  In process, will continue to follow  Medicare Important Message Given:    Date Medicare IM Given:    Medicare IM give by:    Date Additional Medicare IM Given:    Additional Medicare Important Message give by:     If discussed at Long Length of Stay Meetings, dates discussed:    Additional Comments:  Lawerance Sabal, RN 04/15/2015, 11:01 AM

## 2015-04-15 NOTE — Progress Notes (Signed)
NURSING PROGRESS NOTE  [name] [id number] Discharge Data: 04/15/2015 1:59 PM Attending Provider: Cliffton Asters, MD WUJ:[id number] Joellen Jersey, NP   [name] to be D/C'd Home per MD order with hard copy of prescription. Patient denied the use of volunteer services with a wheelchair stating he preferred to walk out and needed the exercise.    All IV's will be discontinued and monitored for bleeding.  All belongings will be returned to patient for patient to take home.  Last Documented Vital Signs:  Blood pressure 155/86, pulse 86, temperature 98.6 F (37 C), temperature source Oral, resp. rate 20, height  (1.778 m), weight 163.8 kg (361 lb 1.8 oz), SpO2 99 %.  Leane Platt RN, BS, BSN

## 2015-04-15 NOTE — Discharge Instructions (Signed)
1. Take Cephalexin (Kelfex) 500 mg four times a day until prescription finished (10/7) 2. Follow diabetic diet. 3. Purchase [employer] glucometer and test strips 4. Keep leg elevated to help with edema 5. After current infection has resolved, wear compression stockings to help prevent swelling and recurrent infection.

## 2015-04-15 NOTE — Progress Notes (Signed)
Subjective: [name], though he reports one episode of hot flash followed by chills during the night.  It only happened once, and he did not notify nursing staff.  He was seen by PT yesterday without issue.  He ambulated the hall without pain or other concern.  He feels safe for discharge home.  Objective: Vital signs in last 24 hours: Filed Vitals:   04/14/15 0453 04/14/15 1402 04/14/15 2113 04/15/15 0623  BP: 118/53 129/64 149/76 139/86  Pulse: 86 85 94 79  Temp: 98.8 F (37.1 C) 98.4 F (36.9 C) 99.8 F (37.7 C) 98.2 F (36.8 C)  TempSrc: Oral Oral Oral Oral  Resp: Height:      Weight:      SpO2: 95% 100% 99% 99%   Weight change:   Intake/Output Summary (Last 24 hours) at 04/15/15 0852 Last data filed at 04/15/15 0454  Gross per 24 hour  Intake    612 ml  Output   1750 ml  Net  -1138 ml    Physical Exam  Constitutional: He is oriented to person, place, and time and well-developed, well-nourished, and in no distress.  Obese male, lying in bed, NAD  HENT:  Head: Normocephalic and atraumatic.  Eyes: EOM are normal.  Cardiovascular: Normal rate, regular rhythm, normal heart sounds and intact distal pulses.   Pulmonary/Chest: Effort normal and breath sounds normal. No respiratory distress. He has no wheezes.  Abdominal: Soft. He exhibits no distension. There is no tenderness. There is no rebound and no guarding.  Musculoskeletal:  2+ LE edema b/l.  LLE nontender to palpation.  Decreased bullous fluid.  Neurological: He is alert and oriented to person, place, and time.  Skin: Skin is warm and dry.  Erythema and warmth, not extending to previously drawn line.  Decreased bullous fluid.    Lab Results: Basic Metabolic Panel:  Recent Labs Lab 04/14/15 0439 04/15/15 0724  NA 138 135  K 4.1 3.9  CL 104 101  CO2 27 29  GLUCOSE 88 95  BUN 11 7  CREATININE 0.71 0.75  CALCIUM 8.8* 8.8*   Liver Function Tests:  Recent Labs Lab 04/13/15 1254  AST 33    ALT 28  ALKPHOS 58  BILITOT 0.6  PROT 6.9  ALBUMIN 2.9*   No results for input(s): LIPASE, AMYLASE in the last 168 hours. No results for input(s): AMMONIA in the last 168 hours. CBC:  Recent Labs Lab 04/14/15 0439 04/15/15 0724  WBC 6.9 7.5  NEUTROABS 3.9 4.1  HGB 12.3* 12.0*  HCT 37.0* 36.8*  MCV 90.2 90.2  PLT 136* 160   Cardiac Enzymes: No results for input(s): CKTOTAL, CKMB, CKMBINDEX, TROPONINI in the last 168 hours. BNP: No results for input(s): PROBNP in the last 168 hours. D-Dimer: No results for input(s): DDIMER in the last 168 hours. CBG:  Recent Labs Lab 04/13/15 2131 04/14/15 0800 04/14/15 1211 04/14/15 1703 04/14/15 2108 04/15/15 0812  GLUCAP 130* 104* 102* 96 116* 90   Hemoglobin A1C: No results for input(s): HGBA1C in the last 168 hours. Fasting Lipid Panel:  Recent Labs Lab 04/14/15 0439  CHOL 187  HDL 29*  LDLCALC 128*  TRIG 151*  CHOLHDL 6.4   Thyroid Function Tests: No results for input(s): TSH, T4TOTAL, FREET4, T3FREE, THYROIDAB in the last 168 hours. Coagulation: No results for input(s): LABPROT, INR in the last 168 hours. Anemia Panel: No results for input(s): VITAMINB12, FOLATE, FERRITIN, TIBC, IRON, RETICCTPCT in the last 168 hours.  Urine Drug Screen: Drugs of Abuse  No results found for: LABOPIA, COCAINSCRNUR, LABBENZ, AMPHETMU, THCU, LABBARB  Alcohol Level: No results for input(s): ETH in the last 168 hours. Urinalysis: No results for input(s): COLORURINE, LABSPEC, PHURINE, GLUCOSEU, HGBUR, BILIRUBINUR, KETONESUR, PROTEINUR, UROBILINOGEN, NITRITE, LEUKOCYTESUR in the last 168 hours.  Invalid input(s): APPERANCEUR Misc. Labs:   Micro Results: Recent Results (from the past 240 hour(s))  Blood culture (routine x 2)     Status: None (Preliminary result)   Collection Time: 04/13/15 12:54 PM  Result Value Ref Range Status   Specimen Description BLOOD LEFT ARM  Final   Special Requests BOTTLES DRAWN AEROBIC AND ANAEROBIC  5CC  Final   Culture NO GROWTH 1 DAY  Final   Report Status PENDING  Incomplete  Blood culture (routine x 2)     Status: None (Preliminary result)   Collection Time: 04/13/15  1:00 PM  Result Value Ref Range Status   Specimen Description BLOOD RIGHT ARM  Final   Special Requests BOTTLES DRAWN AEROBIC AND ANAEROBIC 5CC  Final   Culture NO GROWTH 1 DAY  Final   Report Status PENDING  Incomplete  Wound culture     Status: None (Preliminary result)   Collection Time: 04/13/15  4:08 PM  Result Value Ref Range Status   Specimen Description WOUND LEFT LEG  Final   Special Requests NONE  Final   Gram Stain PENDING  Incomplete   Culture   Final    NO GROWTH 1 DAY Performed at [facility]    Report Status PENDING  Incomplete   Studies/Results: Dg Tibia/fibula Left  04/13/2015   CLINICAL DATA:  Left lower extremity acute swelling for 2 days, extremity cellulitis, diabetes  EXAM: LEFT TIBIA AND FIBULA - 2 VIEW  COMPARISON:  None.  FINDINGS: Left lower extremity subcutaneous edema evident. Soft tissue wound noted of the medial high left ankle. Left tibia and fibula appear intact. No acute osseous finding or bony destruction. No plain film evidence of osteomyelitis. Soft tissue calcification noted. 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  IMPRESSION: Soft tissue swelling.  No acute osseous finding.   Electronically Signed   By: Judie Petit.  Shick M.D.   On: 04/13/2015 13:36   Medications: I have reviewed the patient's current medications. Scheduled Meds: . amLODipine  5 mg Oral Daily  .  ceFAZolin (ANCEF) IV  2 g Intravenous 3 times per day  . enoxaparin (LOVENOX) injection  80 mg Subcutaneous Q24H  . insulin aspart  0-15 Units Subcutaneous TID WC  . insulin aspart  0-5 Units Subcutaneous QHS  . lisinopril  20 mg Oral Daily  . [START ON 04/16/2015] pneumococcal 23 valent vaccine  0.5 mL Intramuscular Tomorrow-1000   Continuous Infusions:  PRN  Meds:.acetaminophen Assessment/Plan: Principal Problem:   Cellulitis of left leg Active Problems:   Hypertension   Prediabetes   Cellulitis and abscess of leg  Mr. Sellitto is a 54 yo male with DMII and HTN, presenting with 3 day h/o LLE bullous cellulitis.  Bullous cellulitis: Improved.  Blood cultures drawn in ED. Wound culture obtained from bullous fluid. He is s/p Clindamycin 900 mg IV in ED and Cefazolin day 2. Will consider MRI if no response to Cefazolin. - Cefazolin, per pharmacy (10/1 - 10/3); PO Keflex for 7 days total and discharge today. - Wound culture without growth at d1.  Gram stain pending. - Blood culture without growth at d1.  DMII: Last A1c in 2015 (  6%). He admits poor adherence to medications and diet. He does not check his blood sugar. Hold metformin - SSI-M  A1c  HTN: stable, good control. Will consider starting statin on discharge. - Lisinopril 20 mg daily - Amlodipine 5 mg daily  FEN/GI: - Carb modified  DVT Ppx: Lovenox  Dispo: Disposition is deferred at this time, awaiting improvement of current medical problems. Anticipated discharge today.   The patient does have a current PCP Ambrose Finland, NP) and does need an [facility] hospital follow-up appointment after discharge.  The patient does not have transportation limitations that hinder transportation to clinic appointments..Services Needed at time of discharge: Y = Yes, Blank = No PT:    OT:   RN:   Equipment:   Other:     Jana Half, MD 04/15/2015, 8:52 AM  I have seen and examined Mr. Mucha with Dr. Ladona Ridgel. Mr. Wurm is feeling much better. He no longer has any pain in his left leg. He has been up walking in the hallway. The swelling in his leg has decreased. He does have a tendency to have swelling in his left leg, typically after he has been standing all day. He has chronic hyperpigmentation of his left leg from midshin to ankle compatible with chronic venous stasis dermatitis. His  cellulitis is receding. He does not wear a compressive stocking. He states that he has not been managing his diabetes well for at least the past 4 months. He can be safely converted to oral cephalexin to complete 7-10 days of total antibiotic therapy. He will followup with his primary care provider at [facility].  Cliffton Asters, MD [facility] [phone] pager   [phone] cell 04/15/2015, 12:38 PM

## 2015-04-16 LAB — WOUND CULTURE
Culture: NO GROWTH
Gram Stain: NONE SEEN

## 2015-04-18 LAB — CULTURE, BLOOD (ROUTINE X 2)
CULTURE: NO GROWTH
Culture: NO GROWTH

## 2015-04-23 ENCOUNTER — Encounter: Payer: Self-pay | Admitting: Internal Medicine

## 2015-04-23 ENCOUNTER — Ambulatory Visit: Payer: Self-pay | Attending: Internal Medicine | Admitting: Internal Medicine

## 2015-04-23 VITALS — BP 136/82 | HR 85 | Temp 98.8°F | Resp 16 | Ht 70.0 in | Wt 361.2 lb

## 2015-04-23 DIAGNOSIS — L03116 Cellulitis of left lower limb: Secondary | ICD-10-CM | POA: Insufficient documentation

## 2015-04-23 DIAGNOSIS — E119 Type 2 diabetes mellitus without complications: Secondary | ICD-10-CM | POA: Insufficient documentation

## 2015-04-23 DIAGNOSIS — Z23 Encounter for immunization: Secondary | ICD-10-CM | POA: Insufficient documentation

## 2015-04-23 DIAGNOSIS — I10 Essential (primary) hypertension: Secondary | ICD-10-CM | POA: Insufficient documentation

## 2015-04-23 LAB — GLUCOSE, POCT (MANUAL RESULT ENTRY): POC Glucose: 108 mg/dl — AB (ref 70–99)

## 2015-04-23 MED ORDER — LISINOPRIL 20 MG PO TABS: 20.0000 mg | ORAL_TABLET | Freq: Every day | ORAL | Status: DC

## 2015-04-23 MED ORDER — CLINDAMYCIN HCL 150 MG PO CAPS: 150.0000 mg | ORAL_CAPSULE | Freq: Three times a day (TID) | ORAL | Status: DC

## 2015-04-23 MED ORDER — HYDROCODONE-ACETAMINOPHEN 5-325 MG PO TABS: ORAL_TABLET | ORAL | Status: DC

## 2015-04-23 MED ORDER — METFORMIN HCL 500 MG PO TABS: 500.0000 mg | ORAL_TABLET | Freq: Every day | ORAL | Status: DC

## 2015-04-23 MED ORDER — AMLODIPINE BESYLATE 5 MG PO TABS: 5.0000 mg | ORAL_TABLET | Freq: Every day | ORAL | Status: DC

## 2015-04-23 NOTE — Progress Notes (Signed)
Patient ID: [name], male   DOB: 04-Aug-1960, 54 y.o.   MRN: [id number]  CC: HFU  HPI: [name] is a 54 y.o. male here today for a follow up visit.  Patient has past medical history of DM, HTN, obesity.  Patient was recently discharged from the hospital on 10/3 after a 2 days stay for bullous cellulitis of the left lower extremity. He was at that time given IV cefazolin and then transitioned to PO Keflex which he has completed. He is now concerned because the blister that was recently on his leg has now opened and draining yellow discharge for the past 2 days. Patient reports that the area is very painful when he is up moving and standing which is new since the blister opened. While in the hospital he reports that he did not have pain. He states that the area looks slightly worse. He denies fever or chills.   Patient has No headache, No chest pain, No abdominal pain - No Nausea, No new weakness tingling or numbness, No Cough - SOB.  Allergies  Allergen Reactions  . Oxycodone Shortness Of Breath   Past Medical History  Diagnosis Date  . Hypertension   . Skin infection   . Obesity   . Bronchitis   . Cellulitis and abscess of lower extremity 04/11/2015  . Diabetes mellitus without complication [facility])    Current Outpatient Prescriptions on File Prior to Visit  Medication Sig Dispense Refill  . acetaminophen (TYLENOL) 500 MG tablet Take 500 mg by mouth every 6 (six) hours as needed for fever.    Marland Kitchen amLODipine (NORVASC) 5 MG tablet Take 1 tablet (5 mg total) by mouth daily. 30 tablet 4  . cephALEXin (KEFLEX) 500 MG capsule Take 1 capsule (500 mg total) by mouth 4 (four) times daily. 20 capsule 0  . lisinopril (PRINIVIL,ZESTRIL) 20 MG tablet Take 1 tablet (20 mg total) by mouth daily. 30 tablet 4  . metFORMIN (GLUCOPHAGE) 500 MG tablet Take 1 tablet (500 mg total) by mouth daily with breakfast. 30 tablet 3  . chlorpheniramine-HYDROcodone (TUSSIONEX PENNKINETIC ER) 10-8 MG/5ML SUER Take 5  mLs by mouth every 12 (twelve) hours as needed for cough. (Patient not taking: Reported on 04/23/2015) 100 mL 0  . HYDROcodone-acetaminophen (NORCO/VICODIN) 5-325 MG per tablet Take 1-2 tablets every 6 hours as needed for severe pain (Patient not taking: Reported on 04/23/2015) 8 tablet 0  . ketorolac (TORADOL) 10 MG tablet Take 1 tablet (10 mg total) by mouth every 6 (six) hours as needed. (Patient not taking: Reported on 04/23/2015) 20 tablet 0  . naproxen sodium (ANAPROX) 220 MG tablet Take 220 mg by mouth 2 (two) times daily with a meal.     No current facility-administered medications on file prior to visit.   Family History  Problem Relation Age of Onset  . Diabetes Mother   . Alcohol abuse Father   . Hypertension Brother   . Asthma Sister    Social History   Social History  . Marital Status: Married    Spouse Name: N/A  . Number of Children: N/A  . Years of Education: N/A   Occupational History  . Not on file.   Social History Main Topics  . Smoking status: Never Smoker   . Smokeless tobacco: Never Used  . Alcohol Use: Yes     Comment: occasionally  . Drug Use: No  . Sexual Activity: Not on file   Other Topics Concern  . Not on file  Social History Narrative    Review of Systems: Other than what is stated in HPI, all other systems are negative.   Objective:   Filed Vitals:   04/23/15 1420  BP: 136/82  Pulse: 85  Temp: 98.8 F (37.1 C)  Resp: 16    Physical Exam  Constitutional: He is oriented to person, place, and time.  Cardiovascular: Normal rate, regular rhythm and normal heart sounds.   Pulmonary/Chest: Effort normal and breath sounds normal.  Musculoskeletal: He exhibits edema and tenderness.  Neurological: He is alert and oriented to person, place, and time.  Skin: Skin is warm. No erythema.     Open draining ulcer. Hyperpigmented skin around open blister and warm to touch  Psychiatric: He has a normal mood and affect.     Lab Results    Component Value Date   WBC 7.5 04/15/2015   HGB 12.0* 04/15/2015   HCT 36.8* 04/15/2015   MCV 90.2 04/15/2015   PLT 160 04/15/2015   Lab Results  Component Value Date   CREATININE 0.75 04/15/2015   BUN 7 04/15/2015   NA 135 04/15/2015   K 3.9 04/15/2015   CL 101 04/15/2015   CO2 29 04/15/2015    Lab Results  Component Value Date   HGBA1C 6.2* 04/13/2015   Lipid Panel     Component Value Date/Time   CHOL 187 04/14/2015 0439   TRIG 151* 04/14/2015 0439   HDL 29* 04/14/2015 0439   CHOLHDL 6.4 04/14/2015 0439   VLDL 30 04/14/2015 0439   LDLCALC 128* 04/14/2015 0439       Assessment and plan:   [name] was seen today for hospitalization follow-up.  Diagnoses and all orders for this visit:  Type 2 diabetes mellitus without complication, without long-term current use of insulin (HCC) -     Glucose (CBG) -     metFORMIN (GLUCOPHAGE) 500 MG tablet; Take 1 tablet (500 mg total) by mouth daily with breakfast. Patients diabetes is well control as evidence by consistently low a1c.  Patient will continue with current therapy and continue to make necessary lifestyle changes.  Reviewed foot care, diet, exercise, annual health maintenance with patient.   Essential hypertension -     amLODipine (NORVASC) 5 MG tablet; Take 1 tablet (5 mg total) by mouth daily. -     lisinopril (PRINIVIL,ZESTRIL) 20 MG tablet; Take 1 tablet (20 mg total) by mouth daily. Patient blood pressure is stable and may continue on current medication.  Education on diet, exercise, and modifiable risk factors discussed. Will obtain appropriate labs as needed. Will follow up in 3-6 months.   Cellulitis of left lower extremity -     clindamycin (CLEOCIN) 150 MG capsule; Take 1 capsule (150 mg total) by mouth 3 (three) times daily. For 10 days -     HYDROcodone-acetaminophen (NORCO/VICODIN) 5-325 MG tablet; Take 1-2 tablets every 8 hours as needed for severe pain Due to appearance of wound, I will retreat with  antibiotics. He will keep area clean and dry. Patient will apply for hospital discount in case a wound care referral is needed. Return precautions explained  Need for influenza vaccination -     Flu Vaccine QUAD 36+ mos PF IM (Fluarix & Fluzone Quad PF)   Return in about 3 months (around 07/24/2015) for DM/HTN.       Ambrose Finland, NP-C [facility] [phone] 04/23/2015, 2:31 PM

## 2015-04-23 NOTE — Progress Notes (Signed)
Patient here for follow up from the hospital  Was admitted with cellulitis to his left lower leg Patient states the "blister" broke and now has an open wound to his left Lower leg

## 2015-04-23 NOTE — Patient Instructions (Signed)

## 2015-04-29 ENCOUNTER — Telehealth: Payer: Self-pay | Admitting: Internal Medicine

## 2015-04-29 NOTE — Telephone Encounter (Signed)
Pt. Called requesting a med refill on HYDROcodone-acetaminophen (NORCO/VICODIN) 5-325 MG tablet. Pt. Stated he takes 2 every 8 hours. Pt. Stated in a scale of 1 to 10 it is beyond 10, he stated that the swollen is going down but when he walks the bottom of the foot hurts. He had to leave work last night because of the pain. Pt. Would like to know what to do because he works and would like to know if he should stay home, and get a doctors note.

## 2015-04-30 NOTE — Telephone Encounter (Signed)
Pt. Called called requesting pain medicine for his leg. Pt. States his leg has been hurting. Pt. Has not been working and he would like a letter stating he is not able to work . Please f/u with pt. ASAP.  °

## 2015-04-30 NOTE — Telephone Encounter (Signed)
Patient requesting a refill on his vicodin Can you refill this for him

## 2015-04-30 NOTE — Telephone Encounter (Signed)
No that was only a short trial and we can not do cyclic refills here. He can have Tramadol for pain---60 tablets with 1 refill

## 2015-05-01 ENCOUNTER — Other Ambulatory Visit: Payer: Self-pay

## 2015-05-01 ENCOUNTER — Telehealth: Payer: Self-pay | Admitting: Internal Medicine

## 2015-05-01 ENCOUNTER — Telehealth: Payer: Self-pay

## 2015-05-01 MED ORDER — TRAMADOL HCL 50 MG PO TABS: 50.0000 mg | ORAL_TABLET | Freq: Two times a day (BID) | ORAL | Status: DC | PRN

## 2015-05-01 NOTE — Telephone Encounter (Signed)
Patient would like to be prescribed Tramadol for pain. Patient would also like a letter stating that he is unable to work. Please follow up.

## 2015-05-01 NOTE — Telephone Encounter (Signed)
Patient came into office requesting refill on vicodin Per provider patient can be prescribed tramadol #60 with 1 RF Work note given to return on Monday 10/24

## 2015-05-01 NOTE — Telephone Encounter (Signed)
Pt. Called called requesting pain medicine for his leg. Pt. States his leg has been hurting. Pt. Has not been working and he would like a letter stating he is not able to work . Please f/u with pt. ASAP.

## 2015-05-05 IMAGING — CT CT ANGIO CHEST
2 of 8 series · 18 of 46 positions shown · IV contrast (APPLIED)
Comparison: Portable chest x-ray of today's date

CLINICAL DATA: Shortness of breath, fever, chills, cough, and chest
pain with history of bronchitis and hypertension

EXAM:
CT ANGIOGRAPHY CHEST WITH CONTRAST
TECHNIQUE: Multidetector CT imaging of the chest was performed using the
standard protocol during bolus administration of intravenous
contrast. Multiplanar CT image reconstructions and MIPs were
obtained to evaluate the vascular anatomy.
CONTRAST:  100mL OMNIPAQUE IOHEXOL 350 MG/ML SOLN intravenously the
timing of the contrast bolus is suboptimal.

[Series 5: thins · axial · 0.77mm/px · z∈[+1231,+1486]mm · 15 of 281 slices shown]
[im 13/281  lung]
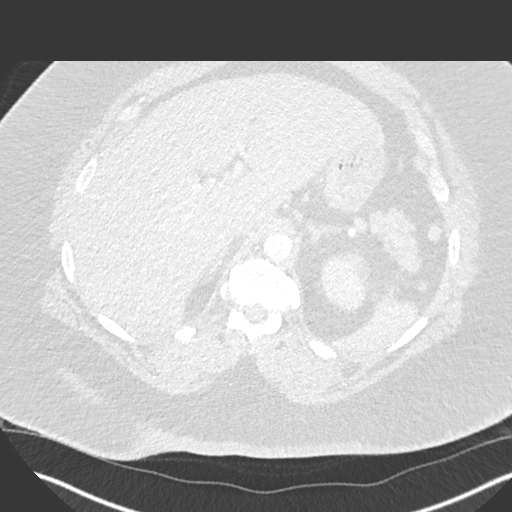
[im 39/281  soft-tissue]
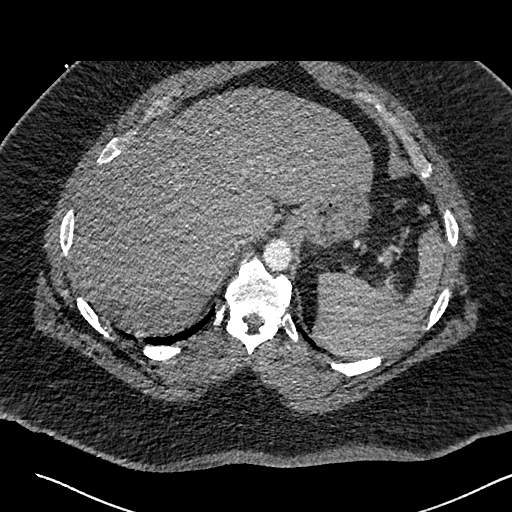
[im 51/281  lung]
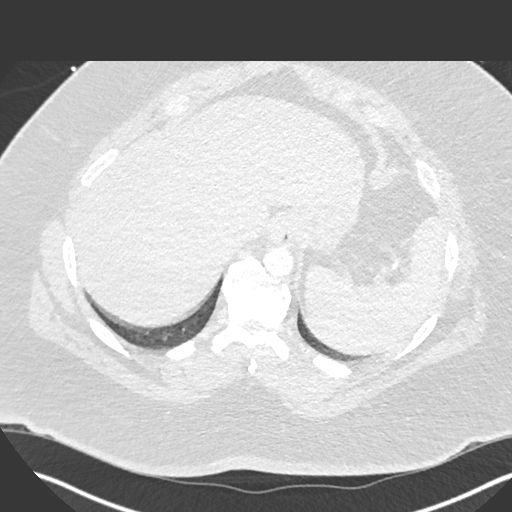
[im 64/281  soft-tissue]
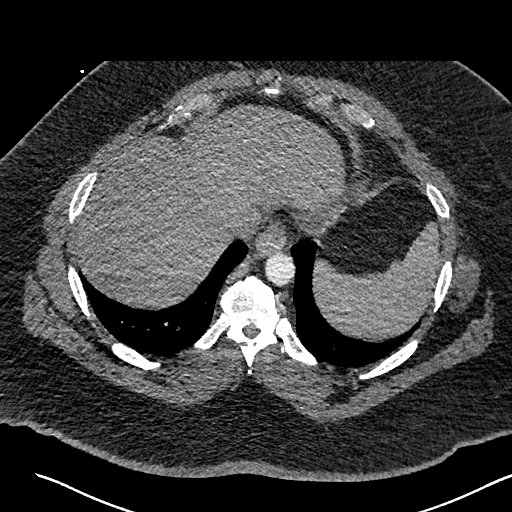
[im 90/281  lung]
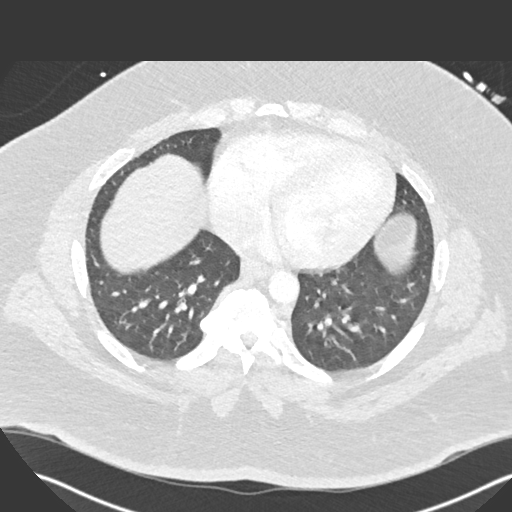
[im 102/281  soft-tissue]
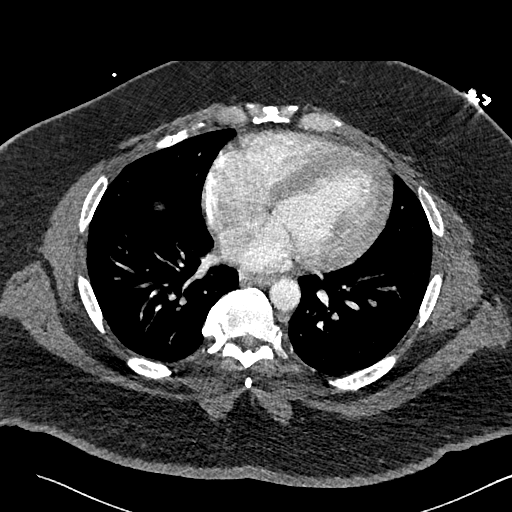
[im 128/281  lung]
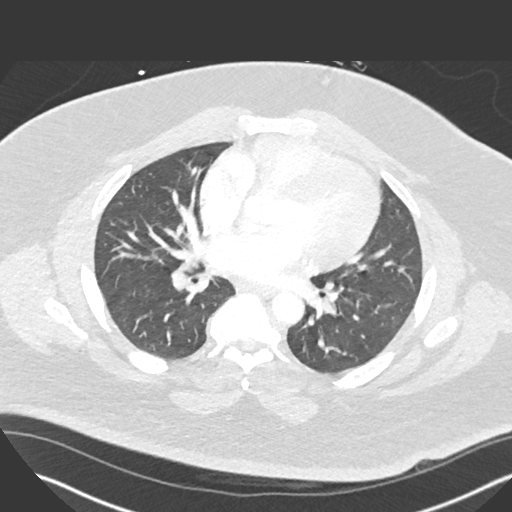
[im 141/281  soft-tissue]
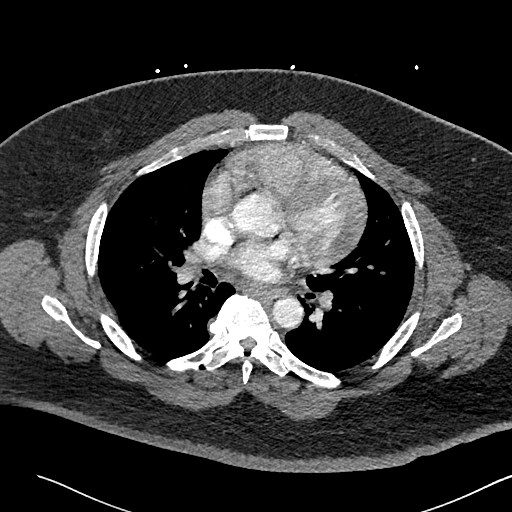
[im 153/281  lung]
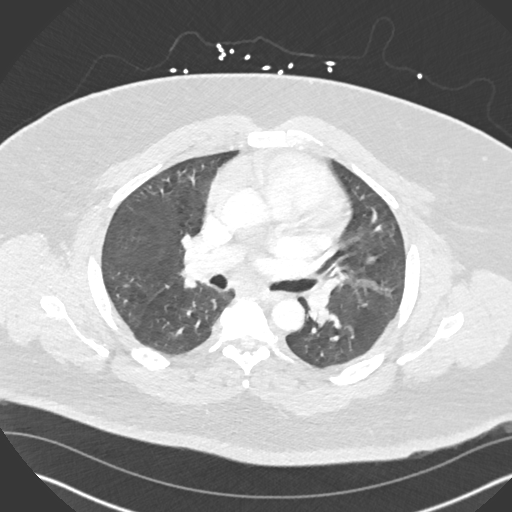
[im 179/281  soft-tissue]
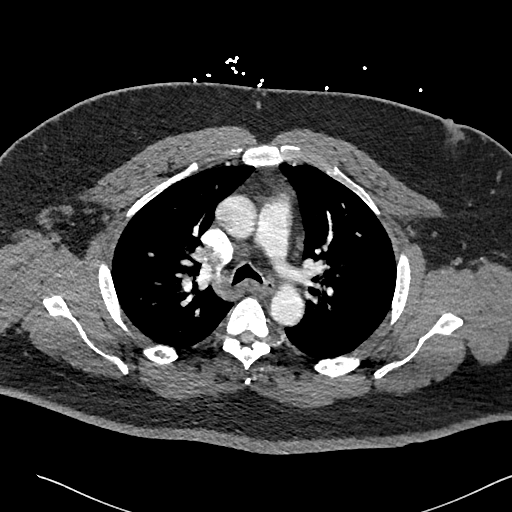
[im 191/281  lung]
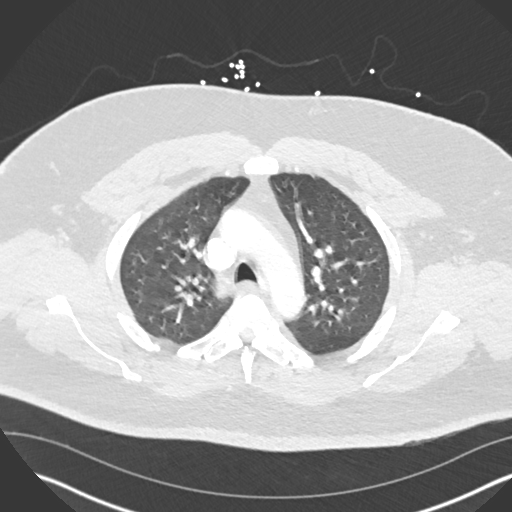
[im 217/281  soft-tissue]
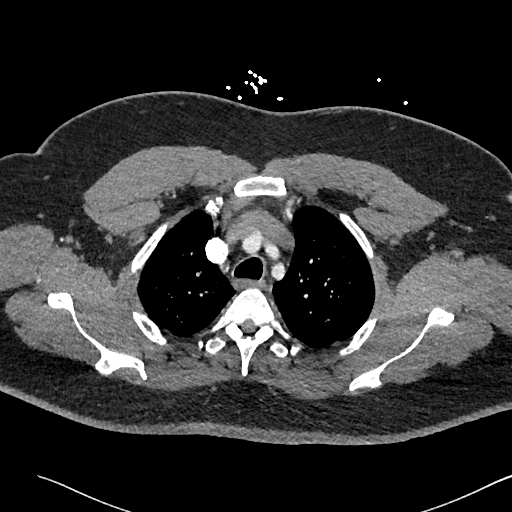
[im 230/281  lung]
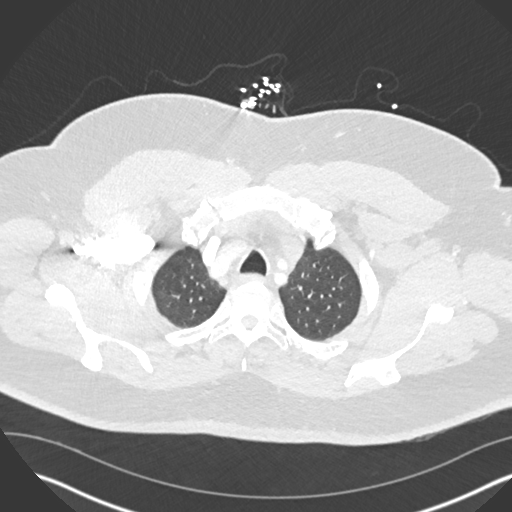
[im 242/281  soft-tissue]
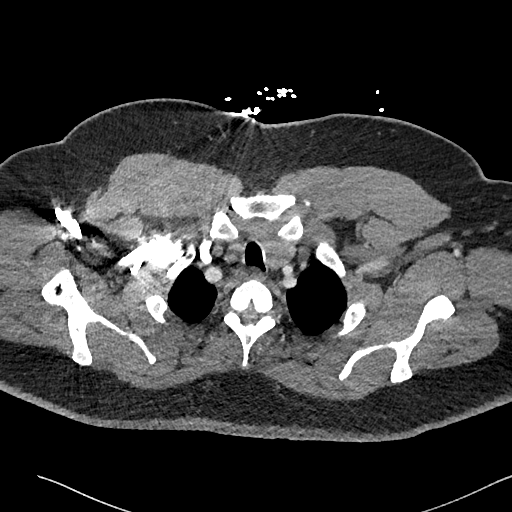
[im 268/281  lung]
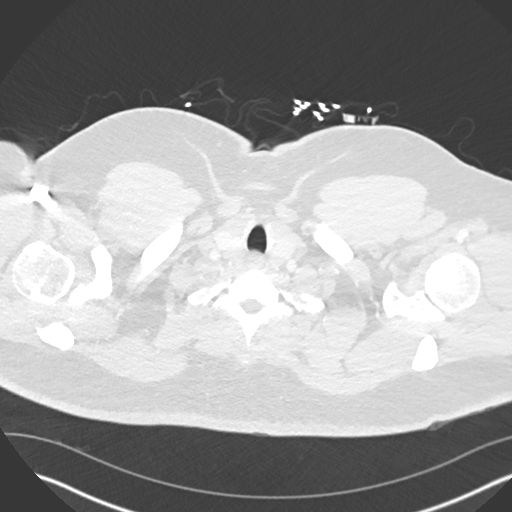

[Series 7: coronal mpr · coronal · 0.57mm/px · 3 of 129 slices shown]
[im 33/129  soft-tissue]
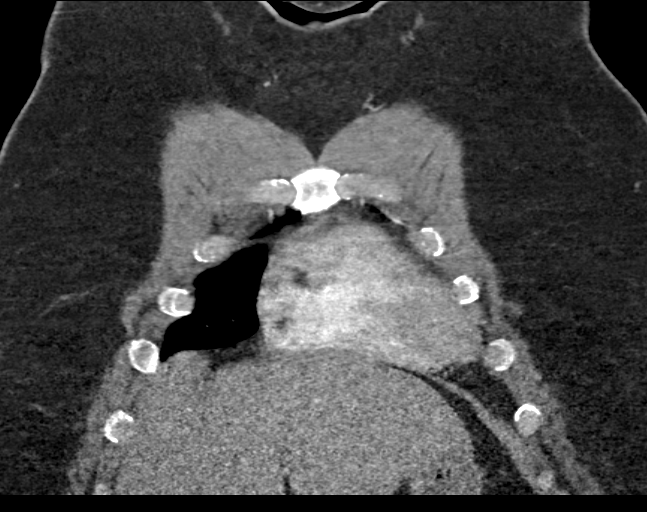
[im 65/129  soft-tissue]
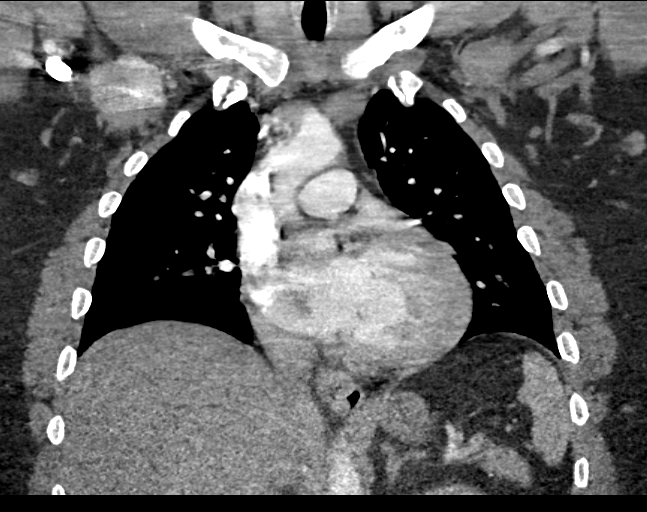
[im 97/129  soft-tissue]
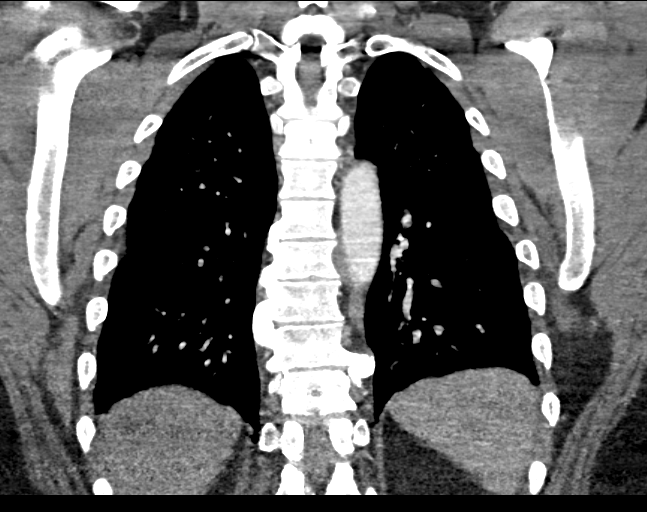

[18 of 46 positions shown; findings below may reference images not displayed]

FINDINGS: The contrast bolus timing is suboptimal. This renders the study
nondiagnostic for mid and peripheral pulmonary arterial tree
evaluation. The proximal pulmonary arteries are grossly normal.
Contrast within the thoracic aorta is normal with no evidence of a
false lumen. There is no thoracic aortic aneurysm. The cardiac
chambers are top-normal in size. There is no pericardial effusion.
There is no bulky mediastinal or hilar lymphadenopathy. There is a
small hiatal hernia.

At lung window settings there is no interstitial or alveolar
infiltrate. There are no pulmonary parenchymal masses. There is no
pleural effusion.

Within the upper abdomen the observed portions of the liver and
spleen are unremarkable. The sternum, thoracic spine, and observed
portions of the ribs exhibit no acute abnormalities.

Review of the MIP images confirms the above findings.
IMPRESSION: 1. The study is nondiagnostic in terms of evaluating the pulmonary
arterial tree from its mid through distal portions. Proximally no
filling defects are demonstrated. The thoracic aorta is normal.
2. There is no evidence of CHF nor pneumonia. There is no pleural
nor pericardial effusion.
3. There is a small hiatal hernia.
4. Consideration could be given for repeating this study or for
performing a ventilation-perfusion lung scan if acute pulmonary
embolism remains strongly suspected.

## 2015-06-18 ENCOUNTER — Encounter (HOSPITAL_COMMUNITY): Payer: Self-pay | Admitting: Emergency Medicine

## 2015-06-18 ENCOUNTER — Emergency Department (HOSPITAL_COMMUNITY): Payer: Self-pay

## 2015-06-18 ENCOUNTER — Observation Stay (HOSPITAL_COMMUNITY)
Admission: EM | Admit: 2015-06-18 | Discharge: 2015-06-20 | Disposition: A | Discharge status: Home / Self Care | Payer: Self-pay | Attending: Internal Medicine | Admitting: Internal Medicine

## 2015-06-18 DIAGNOSIS — Z885 Allergy status to narcotic agent status: Secondary | ICD-10-CM | POA: Insufficient documentation

## 2015-06-18 DIAGNOSIS — Z9889 Other specified postprocedural states: Secondary | ICD-10-CM

## 2015-06-18 DIAGNOSIS — L02215 Cutaneous abscess of perineum: Secondary | ICD-10-CM | POA: Insufficient documentation

## 2015-06-18 DIAGNOSIS — Z6841 Body Mass Index (BMI) 40.0 and over, adult: Secondary | ICD-10-CM | POA: Insufficient documentation

## 2015-06-18 DIAGNOSIS — B9689 Other specified bacterial agents as the cause of diseases classified elsewhere: Secondary | ICD-10-CM

## 2015-06-18 DIAGNOSIS — I10 Essential (primary) hypertension: Secondary | ICD-10-CM | POA: Insufficient documentation

## 2015-06-18 DIAGNOSIS — R7303 Prediabetes: Secondary | ICD-10-CM

## 2015-06-18 DIAGNOSIS — E119 Type 2 diabetes mellitus without complications: Secondary | ICD-10-CM | POA: Insufficient documentation

## 2015-06-18 DIAGNOSIS — K439 Ventral hernia without obstruction or gangrene: Secondary | ICD-10-CM | POA: Insufficient documentation

## 2015-06-18 DIAGNOSIS — Z7984 Long term (current) use of oral hypoglycemic drugs: Secondary | ICD-10-CM | POA: Insufficient documentation

## 2015-06-18 DIAGNOSIS — N492 Inflammatory disorders of scrotum: Principal | ICD-10-CM | POA: Insufficient documentation

## 2015-06-18 LAB — BASIC METABOLIC PANEL
Anion gap: 9 (ref 5–15)
BUN: 8 mg/dL (ref 6–20)
CHLORIDE: 100 mmol/L — AB (ref 101–111)
CO2: 28 mmol/L (ref 22–32)
CREATININE: 0.82 mg/dL (ref 0.61–1.24)
Calcium: 9.6 mg/dL (ref 8.9–10.3)
GFR calc Af Amer: >60 mL/min (ref >60)
GFR calc non Af Amer: >60 mL/min (ref >60)
Glucose, Bld: 120 mg/dL — ABNORMAL HIGH (ref 65–99)
Potassium: 4 mmol/L (ref 3.5–5.1)
SODIUM: 137 mmol/L (ref 135–145)

## 2015-06-18 LAB — CBC WITH DIFFERENTIAL/PLATELET
Basophils Absolute: 0 10*3/uL (ref 0.0–0.1)
Basophils Relative: 0 %
EOS ABS: 0.1 10*3/uL (ref 0.0–0.7)
EOS PCT: 1 %
HCT: 39.3 % (ref 39.0–52.0)
HEMOGLOBIN: 13 g/dL (ref 13.0–17.0)
LYMPHS ABS: 2 10*3/uL (ref 0.7–4.0)
Lymphocytes Relative: 19 %
MCH: 29.6 pg (ref 26.0–34.0)
MCHC: 33.1 g/dL (ref 30.0–36.0)
MCV: 89.5 fL (ref 78.0–100.0)
MONO ABS: 0.5 10*3/uL (ref 0.1–1.0)
MONOS PCT: 5 %
Neutro Abs: 7.8 10*3/uL — ABNORMAL HIGH (ref 1.7–7.7)
Neutrophils Relative %: 75 %
PLATELETS: 157 10*3/uL (ref 150–400)
RBC: 4.39 MIL/uL (ref 4.22–5.81)
RDW: 13.7 % (ref 11.5–15.5)
WBC: 10.4 10*3/uL (ref 4.0–10.5)

## 2015-06-18 LAB — C-REACTIVE PROTEIN: CRP: 6.1 mg/dL — ABNORMAL HIGH (ref <1.0)

## 2015-06-18 LAB — SEDIMENTATION RATE: SED RATE: 45 mm/h — AB (ref 0–16)

## 2015-06-18 LAB — I-STAT CG4 LACTIC ACID, ED: Lactic Acid, Venous: 0.59 mmol/L (ref 0.5–2.0)

## 2015-06-18 MED ORDER — ACETAMINOPHEN 325 MG PO TABS
650.0000 mg | ORAL_TABLET | Freq: Once | ORAL | Status: AC
  Administered 2015-06-18: 650 mg via ORAL

## 2015-06-18 MED ORDER — ACETAMINOPHEN 325 MG PO TABS
ORAL_TABLET | ORAL | Status: AC
  Filled 2015-06-18: qty 2

## 2015-06-18 MED ORDER — IOHEXOL 300 MG/ML  SOLN
100.0000 mL | Freq: Once | INTRAMUSCULAR | Status: AC | PRN
Start: 2015-06-18 — End: 2015-06-18
  Administered 2015-06-18: 100 mL via INTRAVENOUS

## 2015-06-18 NOTE — ED Notes (Signed)
Pt. reports abscess with drainage behind his testicles onset this morning , febrile at triage.

## 2015-06-18 NOTE — Consult Note (Signed)
Reason for Consult: Scrotal Abscess  Referring Physician: Mancel Bale MD  [name] is an 54 y.o. male.   HPI:   1 - Scrotal Abscess - pt with scrotal fluid collection with drianage on eval fevers to 101 and scrotal pain. CT w/o sub-Q gas or tracking to pelvis. H/o diabetes with recurrent soft tissue infections, some requiring packing. .  Today "[name]" is seen in consultation for above. He is referred by Nathaniel Man MD with [facility]. HE normally follows with [facility] and was recently discharged with cellulits.  Past Medical History  Diagnosis Date  . Hypertension   . Skin infection   . Obesity   . Bronchitis   . Cellulitis and abscess of lower extremity 04/11/2015  . Diabetes mellitus without complication ([place])     History reviewed. No pertinent past surgical history.  Family History  Problem Relation Age of Onset  . Diabetes Mother   . Alcohol abuse Father   . Hypertension Brother   . Asthma Sister     Social History:  reports that he has never smoked. He has never used smokeless tobacco. He reports that he drinks alcohol. He reports that he does not use illicit drugs.  Allergies:  Allergies  Allergen Reactions  . Oxycodone Shortness Of Breath    Medications: I have reviewed the patient's current medications.  Results for orders placed or performed during the hospital encounter of 06/18/15 (from the past 48 hour(s))  CBC with Differential     Status: Abnormal   Collection Time: 06/18/15  7:30 PM  Result Value Ref Range   WBC 10.4 4.0 - 10.5 K/uL   RBC 4.39 4.22 - 5.81 MIL/uL   Hemoglobin 13.0 13.0 - 17.0 g/dL   HCT 39.3 39.0 - 52.0 %   MCV 89.5 78.0 - 100.0 fL   MCH 29.6 26.0 - 34.0 pg   MCHC 33.1 30.0 - 36.0 g/dL   RDW 13.7 11.5 - 15.5 %   Platelets 157 150 - 400 K/uL   Neutrophils Relative % 75 %   Neutro Abs 7.8 (H) 1.7 - 7.7 K/uL   Lymphocytes Relative 19 %   Lymphs Abs 2.0 0.7 - 4.0 K/uL   Monocytes Relative 5 %   Monocytes Absolute  0.5 0.1 - 1.0 K/uL   Eosinophils Relative 1 %   Eosinophils Absolute 0.1 0.0 - 0.7 K/uL   Basophils Relative 0 %   Basophils Absolute 0.0 0.0 - 0.1 K/uL  Basic metabolic panel     Status: Abnormal   Collection Time: 06/18/15  7:30 PM  Result Value Ref Range   Sodium 137 135 - 145 mmol/L   Potassium 4.0 3.5 - 5.1 mmol/L   Chloride 100 (L) 101 - 111 mmol/L   CO2 28 22 - 32 mmol/L   Glucose, Bld 120 (H) 65 - 99 mg/dL   BUN 8 6 - 20 mg/dL   Creatinine, Ser 0.82 0.61 - 1.24 mg/dL   Calcium 9.6 8.9 - 10.3 mg/dL   GFR calc non Af Amer >60 >60 mL/min   GFR calc Af Amer >60 >60 mL/min    Comment: (NOTE) The eGFR has been calculated using the CKD EPI equation. This calculation has not been validated in all clinical situations. eGFR's persistently <60 mL/min signify possible Chronic Kidney Disease.    Anion gap 9 5 - 15  C-reactive protein     Status: Abnormal   Collection Time: 06/18/15 10:33 PM  Result Value Ref Range  CRP 6.1 (H) <1.0 mg/dL  I-Stat CG4 Lactic Acid, ED     Status: None   Collection Time: 06/18/15 10:42 PM  Result Value Ref Range   Lactic Acid, Venous 0.59 0.5 - 2.0 mmol/L    No results found.  Review of Systems  Constitutional: Positive for fever and malaise/fatigue. Negative for weight loss.  HENT: Negative.   Eyes: Negative.   Respiratory: Negative.   Cardiovascular: Negative.   Genitourinary: Negative.  Negative for dysuria, frequency, hematuria and flank pain.  Musculoskeletal: Negative.   Skin: Negative.   Neurological: Negative.   Endo/Heme/Allergies: Negative.   Psychiatric/Behavioral: Negative.    Blood pressure 161/113, pulse 102, temperature 101.1 F (38.4 C), temperature source Oral, resp. rate 18, height _0  (1.778 m), weight 164.202 kg (362 lb), SpO2 97 %. Physical Exam  Constitutional: He appears well-developed and well-nourished.  Morbid obesity, pleasant.  HENT:  Head: Normocephalic.  Eyes: Pupils are equal, round, and reactive to  light.  Neck: Normal range of motion.  Cardiovascular: Normal rate.   Respiratory: Effort normal.  GI: Soft.  Truncal obesity limits sensitivityi  Genitourinary: Penis normal.  Indurated perinal-scrotal abscess w/o crepitus. No Fluctence beynond scrotal base / perinuem.   Musculoskeletal: Normal range of motion.  Neurological: He is alert.  Skin: Skin is warm.  Psychiatric: He has a normal mood and affect. His behavior is normal. Judgment and thought content normal.    Assessment/Plan:   1 - Scrotal Abscess - rec operative I+D. Will require packing / wound care daily and likely home health at discharge if he is unable to reach and pack area. His body habitus would make succesful bedside procedure less succesful. Likely medical admission for DM management, IV ABX, DC when CX final and afebrile x 24 hours.  Risks includign bleeding, infection, damage / loss of testicle discussed.  Last Meal 6 hours ago and stomach empty by imaging.   [name] 06/18/2015, 11:54 PM

## 2015-06-18 NOTE — ED Provider Notes (Signed)
CSN: [id number]     Arrival date & time 06/18/15  1901 History   First MD Initiated Contact with Patient 06/18/15 2101     Chief Complaint  Patient presents with  . Abscess     (Consider location/radiation/quality/duration/timing/severity/associated sxs/prior Treatment) HPI  Patient is a 54 year old male with past medical history significant for hypertension, diabetes, who presents to the emergency department with scrotal swelling with an abscess. Patient reported that that earlier this morning he noted a "boil" on the underside of his scrotum. Reports that later in the afternoon he noted that the area of the "boil" had become significantly larger with a "hard area" that tracked back towards his rectum.  Associated with bilateral scrotal swelling. No drainage. Found to have a fever of 101 on arrival. Denies pain to the area. Denies recent trauma, UTI, or perianal infection. Denies dysuria, difficulty urinating, penile swelling, penile discharge. Reports he has had 2 abscesses one of his lower extremity in the past. Denies prior h/o STI.   Past Medical History  Diagnosis Date  . Hypertension   . Skin infection   . Obesity   . Bronchitis   . Cellulitis and abscess of lower extremity 04/11/2015  . Diabetes mellitus without complication ([facility])    History reviewed. No pertinent past surgical history. Family History  Problem Relation Age of Onset  . Diabetes Mother   . Alcohol abuse Father   . Hypertension Brother   . Asthma Sister    Social History  Substance Use Topics  . Smoking status: Never Smoker   . Smokeless tobacco: Never Used  . Alcohol Use: Yes    Review of Systems  Constitutional: Positive for fever. Negative for appetite change and fatigue.  HENT: Negative for congestion.   Eyes: Negative for visual disturbance.  Respiratory: Negative for chest tightness and shortness of breath.   Cardiovascular: Negative for chest pain and palpitations.  Gastrointestinal: Negative  for nausea, vomiting, abdominal pain, constipation and blood in stool.  Genitourinary: Positive for scrotal swelling. Negative for dysuria, flank pain, decreased urine volume, discharge, penile swelling, penile pain and testicular pain.  Musculoskeletal: Negative for back pain.  Skin: Positive for rash.  Neurological: Negative for dizziness, seizures, weakness, light-headedness and headaches.  Psychiatric/Behavioral: Negative for behavioral problems.      Allergies  Oxycodone  Home Medications   Prior to Admission medications   Medication Sig Start Date End Date Taking? Authorizing Provider  acetaminophen (TYLENOL) 500 MG tablet Take 500 mg by mouth every 6 (six) hours as needed for mild pain, moderate pain or fever.  04/06/14  Yes Fayrene HelperBowie Tran, PA-C  amLODipine (NORVASC) 5 MG tablet Take 1 tablet (5 mg total) by mouth daily. 04/23/15  Yes Ambrose FinlandValerie A Keck, NP  lisinopril (PRINIVIL,ZESTRIL) 20 MG tablet Take 1 tablet (20 mg total) by mouth daily. 04/23/15  Yes Ambrose FinlandValerie A Keck, NP  metFORMIN (GLUCOPHAGE) 500 MG tablet Take 1 tablet (500 mg total) by mouth daily with breakfast. 04/23/15  Yes Ambrose FinlandValerie A Keck, NP  cephALEXin (KEFLEX) 500 MG capsule Take 1 capsule (500 mg total) by mouth 4 (four) times daily. Patient not taking: Reported on 06/18/2015 04/15/15   Jana HalfNicholas A Taylor, MD  chlorpheniramine-HYDROcodone [facility](TUSSIONEX PENNKINETIC ER) 10-8 MG/5ML SUER Take 5 mLs by mouth every 12 (twelve) hours as needed for cough. Patient not taking: Reported on 04/23/2015 01/09/15   Garlon HatchetLisa M Sanders, PA-C  clindamycin (CLEOCIN) 150 MG capsule Take 1 capsule (150 mg total) by mouth 3 (three) times daily. For  10 days Patient not taking: Reported on 06/18/2015 04/23/15   Ambrose Finland, NP  HYDROcodone-acetaminophen (NORCO/VICODIN) 5-325 MG tablet Take 1-2 tablets every 8 hours as needed for severe pain Patient not taking: Reported on 06/18/2015 04/23/15   Ambrose Finland, NP  ketorolac (TORADOL) 10 MG tablet Take 1  tablet (10 mg total) by mouth every 6 (six) hours as needed. Patient not taking: Reported on 04/23/2015 04/15/14   Kathlen Mody, MD  traMADol (ULTRAM) 50 MG tablet Take 1 tablet (50 mg total) by mouth every 12 (twelve) hours as needed. Patient not taking: Reported on 06/18/2015 05/01/15   Ambrose Finland, NP   BP 134/65 mmHg  Pulse 86  Temp(Src) 98.5 F (36.9 C) (Oral)  Resp 19  Ht  (1.778 m)  Wt 164.202 kg  BMI 51.94 kg/m2  SpO2 98% Physical Exam  Constitutional: He is oriented to person, place, and time. He appears well-developed and well-nourished. No distress.  Morbidly obese with significant truncal obesity  HENT:  Head: Atraumatic.  Mouth/Throat: Oropharynx is clear and moist.  Eyes: Conjunctivae and EOM are normal. Pupils are equal, round, and reactive to light.  Neck: Normal range of motion. No tracheal deviation present.  Cardiovascular: Normal rate, regular rhythm, normal heart sounds and intact distal pulses.   Pulmonary/Chest: Effort normal and breath sounds normal. No respiratory distress.  Abdominal: Soft. He exhibits no distension. There is no tenderness. There is no rebound and no guarding.  Genitourinary: Right testis shows swelling. Left testis shows swelling.  perinal- scrotal abscess with induration at the base of the scrotum. No surrounding erythema, warmth, streaking,or crepitance. Non-tender to palpation. No peri-anal abscess palpated.   Musculoskeletal: Normal range of motion. He exhibits no edema.  Neurological: He is alert and oriented to person, place, and time.  Skin: Skin is warm. Rash noted.  Psychiatric: He has a normal mood and affect.  Nursing note and vitals reviewed.   ED Course  Procedures (including critical care time) Labs Review Labs Reviewed  CBC WITH DIFFERENTIAL/PLATELET - Abnormal; Notable for the following:    Neutro Abs 7.8 (*)    All other components within normal limits  BASIC METABOLIC PANEL - Abnormal; Notable for the  following:    Chloride 100 (*)    Glucose, Bld 120 (*)    All other components within normal limits  SEDIMENTATION RATE - Abnormal; Notable for the following:    Sed Rate 45 (*)    All other components within normal limits  C-REACTIVE PROTEIN - Abnormal; Notable for the following:    CRP 6.1 (*)    All other components within normal limits  GLUCOSE, CAPILLARY - Abnormal; Notable for the following:    Glucose-Capillary 101 (*)    All other components within normal limits  CULTURE, BLOOD (ROUTINE X 2)  CULTURE, BLOOD (ROUTINE X 2)  ANAEROBIC CULTURE  CULTURE, ROUTINE-ABSCESS  GLUCOSE, CAPILLARY  I-STAT CG4 LACTIC ACID, ED  I-STAT CG4 LACTIC ACID, ED    Imaging Review Ct Abdomen Pelvis W Contrast  06/19/2015  CLINICAL DATA:  Scrotal cellulitis and drainage from wound. Evaluate for abscess. EXAM: CT ABDOMEN AND PELVIS WITH CONTRAST TECHNIQUE: Multidetector CT imaging of the abdomen and pelvis was performed using the standard protocol following bolus administration of intravenous contrast. CONTRAST:  OMNIPAQUE IOHEXOL 300 MG/ML  SOLN COMPARISON:  None. FINDINGS: Lower chest:  No acute findings. Hepatobiliary: No masses or other significant abnormality. Pancreas: No mass, inflammatory changes, or other significant abnormality. Spleen:  Within normal limits in size and appearance. Adrenals/Urinary Tract: No masses identified. Small right renal cysts noted. No evidence of hydronephrosis. Stomach/Bowel: No evidence of obstruction, inflammatory process, or abnormal fluid collections. Vascular/Lymphatic: No pathologically enlarged lymph nodes. No evidence of abdominal aortic aneurysm. Reproductive: No mass or other significant abnormality. Other: Small paraumbilical ventral hernia seen containing only fat. Edema is seen within the posterior scrotal soft tissues, consistent with cellulitis. There is no evidence of rim enhancing fluid collection or soft tissue gas. Musculoskeletal:  No suspicious  bone lesions identified. IMPRESSION: Edema within posterior scrotal soft tissues, consistent with cellulitis. No evidence of discrete abscess or soft tissue gas. Small paraumbilical ventral hernia containing only fat. Electronically Signed   By: Myles Rosenthal M.D.   On: 06/19/2015 00:04   I have personally reviewed and evaluated these images and lab results as part of my medical decision-making.   EKG Interpretation None      MDM   Final diagnoses:  None   Patient is a 54 year old male past medical history significant for hypertension and diabetes, who presents to the emergency department with a scrotal abscess associated with fever and scrotal swelling. On arrival no acute distress, not ill appearing. Fever of 101, hemodynamically stable. Exam as above, notable for scrotal abscess with induration and surrounding erythema with scrotal swelling, nontender to palpation, no obvious tracking to the rectum or perineum, no perianal abscess palpated.   Patient's clinical picture concerning for Fournier's gangrene versus scrotal abscess. Basic labs, blood cultures obtained. Urology was immediately contacted upon patient arrival given the concern for Fournier's gangrene. They recommended a CT abdomen and pelvis.  CT abdomen and pelvis showed no signs of Fournier's. Given the patient's scrotal abscess with associated fever and diabetes, will admit the patient for further treatment. Started on IV Clindamycin 900 mg IV. Urology evaluated the patient in the emergency department. Reports that they will either take the patient to the OR or do a bedside I&D. Patient admitted to medicine. Stable for the floor.     Corena Herter, MD 06/19/15 1045  Pricilla Loveless, MD 06/20/15 [phone]

## 2015-06-19 ENCOUNTER — Observation Stay (HOSPITAL_COMMUNITY): Payer: Self-pay | Admitting: Certified Registered"

## 2015-06-19 ENCOUNTER — Encounter (HOSPITAL_COMMUNITY)
Admission: EM | Disposition: A | Discharge status: Home / Self Care | Payer: Self-pay | Source: Home / Self Care | Attending: Emergency Medicine

## 2015-06-19 ENCOUNTER — Encounter (HOSPITAL_COMMUNITY): Payer: Self-pay | Admitting: Certified Registered"

## 2015-06-19 ENCOUNTER — Emergency Department (HOSPITAL_COMMUNITY): Payer: Self-pay | Admitting: Registered Nurse

## 2015-06-19 DIAGNOSIS — N492 Inflammatory disorders of scrotum: Secondary | ICD-10-CM | POA: Diagnosis present

## 2015-06-19 DIAGNOSIS — L02215 Cutaneous abscess of perineum: Secondary | ICD-10-CM | POA: Diagnosis present

## 2015-06-19 HISTORY — PX: INCISION AND DRAINAGE ABSCESS: SHX5864

## 2015-06-19 LAB — GLUCOSE, CAPILLARY
GLUCOSE-CAPILLARY: 98 mg/dL (ref 65–99)
GLUCOSE-CAPILLARY: 110 mg/dL — AB (ref 65–99)
GLUCOSE-CAPILLARY: 92 mg/dL (ref 65–99)
Glucose-Capillary: 101 mg/dL — ABNORMAL HIGH (ref 65–99)
Glucose-Capillary: 101 mg/dL — ABNORMAL HIGH (ref 65–99)

## 2015-06-19 SURGERY — INCISION AND DRAINAGE, ABSCESS
Anesthesia: General

## 2015-06-19 SURGERY — INCISION AND DRAINAGE, ABSCESS
Anesthesia: General | Site: Scrotum

## 2015-06-19 MED ORDER — PROPOFOL 10 MG/ML IV BOLUS
INTRAVENOUS | Status: DC | PRN
  Administered 2015-06-19: 200 mg via INTRAVENOUS
  Administered 2015-06-19: 50 mg via INTRAVENOUS

## 2015-06-19 MED ORDER — DOXYCYCLINE HYCLATE 100 MG PO TABS
100.0000 mg | ORAL_TABLET | Freq: Two times a day (BID) | ORAL | Status: DC
Start: 2015-06-19 — End: 2015-06-20
  Administered 2015-06-19 – 2015-06-20 (×2): 100 mg via ORAL
  Filled 2015-06-19 (×2): qty 1

## 2015-06-19 MED ORDER — LIDOCAINE HCL (CARDIAC) 20 MG/ML IV SOLN
INTRAVENOUS | Status: DC | PRN
  Administered 2015-06-19: 100 mg via INTRAVENOUS

## 2015-06-19 MED ORDER — SUFENTANIL CITRATE 50 MCG/ML IV SOLN
INTRAVENOUS | Status: AC
  Filled 2015-06-19: qty 1

## 2015-06-19 MED ORDER — HYDROMORPHONE HCL 1 MG/ML IJ SOLN: 0.2500 mg | INTRAMUSCULAR | Status: DC | PRN

## 2015-06-19 MED ORDER — ONDANSETRON HCL 4 MG/2ML IJ SOLN
INTRAMUSCULAR | Status: AC
  Filled 2015-06-19: qty 2

## 2015-06-19 MED ORDER — LISINOPRIL 20 MG PO TABS: 20.0000 mg | ORAL_TABLET | Freq: Every day | ORAL | Status: DC

## 2015-06-19 MED ORDER — INSULIN ASPART 100 UNIT/ML ~~LOC~~ SOLN: 0.0000 [IU] | Freq: Every day | SUBCUTANEOUS | Status: DC

## 2015-06-19 MED ORDER — ACETAMINOPHEN 500 MG PO TABS: 500.0000 mg | ORAL_TABLET | Freq: Four times a day (QID) | ORAL | Status: DC | PRN

## 2015-06-19 MED ORDER — MEPERIDINE HCL 25 MG/ML IJ SOLN: 6.2500 mg | INTRAMUSCULAR | Status: DC | PRN

## 2015-06-19 MED ORDER — 0.9 % SODIUM CHLORIDE (POUR BTL) OPTIME
TOPICAL | Status: DC | PRN
  Administered 2015-06-19: 1000 mL

## 2015-06-19 MED ORDER — HEPARIN SODIUM (PORCINE) 5000 UNIT/ML IJ SOLN: 5000.0000 [IU] | Freq: Three times a day (TID) | INTRAMUSCULAR | Status: DC

## 2015-06-19 MED ORDER — INSULIN ASPART 100 UNIT/ML ~~LOC~~ SOLN: 0.0000 [IU] | Freq: Three times a day (TID) | SUBCUTANEOUS | Status: DC

## 2015-06-19 MED ORDER — CLINDAMYCIN PHOSPHATE 900 MG/50ML IV SOLN
900.0000 mg | Freq: Once | INTRAVENOUS | Status: AC
  Administered 2015-06-19: 900 mg via INTRAVENOUS
  Filled 2015-06-19: qty 50

## 2015-06-19 MED ORDER — SODIUM CHLORIDE 0.9 % IJ SOLN
INTRAMUSCULAR | Status: AC
  Filled 2015-06-19: qty 10

## 2015-06-19 MED ORDER — MIDAZOLAM HCL 2 MG/2ML IJ SOLN
INTRAMUSCULAR | Status: AC
  Filled 2015-06-19: qty 2

## 2015-06-19 MED ORDER — MIDAZOLAM HCL 5 MG/5ML IJ SOLN
INTRAMUSCULAR | Status: DC | PRN
  Administered 2015-06-19: 2 mg via INTRAVENOUS

## 2015-06-19 MED ORDER — AMLODIPINE BESYLATE 5 MG PO TABS: 5.0000 mg | ORAL_TABLET | Freq: Every day | ORAL | Status: DC

## 2015-06-19 MED ORDER — SODIUM CHLORIDE 0.9 % IV SOLN
INTRAVENOUS | Status: DC | PRN
  Administered 2015-06-19: 01:00:00 via INTRAVENOUS

## 2015-06-19 MED ORDER — ENOXAPARIN SODIUM 80 MG/0.8ML ~~LOC~~ SOLN
0.5000 mg/kg | SUBCUTANEOUS | Status: DC
  Administered 2015-06-19 – 2015-06-20 (×2): 80 mg via SUBCUTANEOUS
  Filled 2015-06-19 (×2): qty 0.8

## 2015-06-19 MED ORDER — SUCCINYLCHOLINE CHLORIDE 20 MG/ML IJ SOLN
INTRAMUSCULAR | Status: DC | PRN
  Administered 2015-06-19: 200 mg via INTRAVENOUS

## 2015-06-19 MED ORDER — SUCCINYLCHOLINE CHLORIDE 20 MG/ML IJ SOLN
INTRAMUSCULAR | Status: AC
  Filled 2015-06-19: qty 1

## 2015-06-19 MED ORDER — LIDOCAINE HCL (CARDIAC) 20 MG/ML IV SOLN
INTRAVENOUS | Status: AC
  Filled 2015-06-19: qty 5

## 2015-06-19 MED ORDER — ONDANSETRON HCL 4 MG PO TABS: 4.0000 mg | ORAL_TABLET | Freq: Four times a day (QID) | ORAL | Status: DC | PRN

## 2015-06-19 MED ORDER — KETOROLAC TROMETHAMINE 30 MG/ML IJ SOLN: 30.0000 mg | Freq: Four times a day (QID) | INTRAMUSCULAR | Status: DC | PRN

## 2015-06-19 MED ORDER — GLYCOPYRROLATE 0.2 MG/ML IJ SOLN
INTRAMUSCULAR | Status: AC
  Filled 2015-06-19: qty 1

## 2015-06-19 MED ORDER — CLINDAMYCIN PHOSPHATE 600 MG/50ML IV SOLN
600.0000 mg | Freq: Three times a day (TID) | INTRAVENOUS | Status: DC
  Filled 2015-06-19 (×2): qty 50

## 2015-06-19 MED ORDER — SUFENTANIL CITRATE 50 MCG/ML IV SOLN
INTRAVENOUS | Status: DC | PRN
  Administered 2015-06-19: 20 ug via INTRAVENOUS
  Administered 2015-06-19: 10 ug via INTRAVENOUS

## 2015-06-19 MED ORDER — ONDANSETRON HCL 4 MG/2ML IJ SOLN: 4.0000 mg | Freq: Once | INTRAMUSCULAR | Status: DC | PRN

## 2015-06-19 MED ORDER — GLYCOPYRROLATE 0.2 MG/ML IJ SOLN
INTRAMUSCULAR | Status: DC | PRN
  Administered 2015-06-19: 0.2 mg via INTRAVENOUS

## 2015-06-19 MED ORDER — METFORMIN HCL 500 MG PO TABS: 500.0000 mg | ORAL_TABLET | Freq: Every day | ORAL | Status: DC

## 2015-06-19 MED ORDER — ONDANSETRON HCL 4 MG/2ML IJ SOLN: 4.0000 mg | Freq: Four times a day (QID) | INTRAMUSCULAR | Status: DC | PRN

## 2015-06-19 MED ORDER — PROPOFOL 10 MG/ML IV BOLUS
INTRAVENOUS | Status: AC
  Filled 2015-06-19: qty 20

## 2015-06-19 MED ORDER — ACETAMINOPHEN 500 MG PO TABS
500.0000 mg | ORAL_TABLET | ORAL | Status: DC | PRN
  Administered 2015-06-19: 500 mg via ORAL
  Filled 2015-06-19: qty 1

## 2015-06-19 SURGICAL SUPPLY — 46 items
BANDAGE GAUZE 4  KLING STR (GAUZE/BANDAGES/DRESSINGS) IMPLANT
BLADE 10 SAFETY STRL DISP (BLADE) IMPLANT
BLADE SURG 10 STRL SS (BLADE) IMPLANT
BLADE SURG 15 STRL LF DISP TIS (BLADE) ×1 IMPLANT
BLADE SURG 15 STRL SS (BLADE) ×1
BLADE SURG ROTATE 9660 (MISCELLANEOUS) IMPLANT
BNDG CONFORM 2 STRL LF (GAUZE/BANDAGES/DRESSINGS) IMPLANT
BNDG GAUZE ELAST 4 BULKY (GAUZE/BANDAGES/DRESSINGS) IMPLANT
BUCKET BIOHAZARD WASTE 5 GAL (MISCELLANEOUS) IMPLANT
COVER SURGICAL LIGHT HANDLE (MISCELLANEOUS) ×2 IMPLANT
DRAIN PENROSE 1/4X12 LTX STRL (WOUND CARE) IMPLANT
DRAPE PROXIMA HALF (DRAPES) ×2 IMPLANT
DRESSING TELFA 8X3 (GAUZE/BANDAGES/DRESSINGS) IMPLANT
DRSG PAD ABDOMINAL 8X10 ST (GAUZE/BANDAGES/DRESSINGS) ×2 IMPLANT
ELECT CAUTERY BLADE 6.4 (BLADE) ×2 IMPLANT
ELECT REM PT RETURN 9FT ADLT (ELECTROSURGICAL) ×2
ELECTRODE REM PT RTRN 9FT ADLT (ELECTROSURGICAL) ×1 IMPLANT
GAUZE PACKING IODOFORM 2 (PACKING) ×2 IMPLANT
GAUZE SPONGE 4X4 16PLY XRAY LF (GAUZE/BANDAGES/DRESSINGS) IMPLANT
GLOVE BIO SURGEON STRL SZ7.5 (GLOVE) ×2 IMPLANT
GLOVE BIO SURGEON STRL SZ8 (GLOVE) IMPLANT
GLOVE BIOGEL PI IND STRL 7.0 (GLOVE) ×1 IMPLANT
GLOVE BIOGEL PI INDICATOR 7.0 (GLOVE) ×1
GOWN STRL REUS W/ TWL XL LVL3 (GOWN DISPOSABLE) ×1 IMPLANT
GOWN STRL REUS W/TWL XL LVL3 (GOWN DISPOSABLE) ×1
H R LUBE JELLY XXX (MISCELLANEOUS) IMPLANT
KIT BASIN OR (CUSTOM PROCEDURE TRAY) IMPLANT
KIT ROOM TURNOVER OR (KITS) IMPLANT
NEEDLE HYPO 25GX1X1/2 BEV (NEEDLE) IMPLANT
NS IRRIG 1000ML POUR BTL (IV SOLUTION) IMPLANT
PACK CYSTOSCOPY (CUSTOM PROCEDURE TRAY) IMPLANT
PAD ARMBOARD 7.5X6 YLW CONV (MISCELLANEOUS) IMPLANT
PENCIL BUTTON HOLSTER BLD 10FT (ELECTRODE) ×2 IMPLANT
SPONGE GAUZE 4X4 12PLY STER LF (GAUZE/BANDAGES/DRESSINGS) ×2 IMPLANT
SPONGE LAP 18X18 X RAY DECT (DISPOSABLE) ×2 IMPLANT
SUT ETHILON 2 0 FS 18 (SUTURE) ×2 IMPLANT
SWAB COLLECTION DEVICE MRSA (MISCELLANEOUS) ×2 IMPLANT
SYR BULB IRRIGATION 50ML (SYRINGE) IMPLANT
SYR CONTROL 10ML LL (SYRINGE) IMPLANT
TAPE CLOTH SURG 6X10 WHT LF (GAUZE/BANDAGES/DRESSINGS) ×2 IMPLANT
TOWEL OR 17X24 6PK STRL BLUE (TOWEL DISPOSABLE) ×2 IMPLANT
TOWEL OR 17X26 10 PK STRL BLUE (TOWEL DISPOSABLE) ×2 IMPLANT
TUBE ANAEROBIC SPECIMEN COL (MISCELLANEOUS) ×2 IMPLANT
TUBE CONNECTING 12X1/4 (SUCTIONS) ×2 IMPLANT
WATER STERILE IRR 1000ML POUR (IV SOLUTION) IMPLANT
YANKAUER SUCT BULB TIP NO VENT (SUCTIONS) ×2 IMPLANT

## 2015-06-19 NOTE — Anesthesia Postprocedure Evaluation (Signed)
Anesthesia Post Note  Patient: [name]  Procedure(s) Performed: Procedure(s) (LRB): INCISION AND DRAINAGE SCROTAL ABSCESS (N/A)  Patient location during evaluation: PACU Anesthesia Type: General Level of consciousness: awake and alert Pain management: pain level controlled Vital Signs Assessment: post-procedure vital signs reviewed and stable Respiratory status: spontaneous breathing, nonlabored ventilation, respiratory function stable and patient connected to nasal cannula oxygen Cardiovascular status: blood pressure returned to baseline and stable Postop Assessment: no signs of nausea or vomiting Anesthetic complications: no    Last Vitals:  Filed Vitals:   06/18/15 2315 06/19/15 0149  BP: 162/95 111/71  Pulse: 93 98  Temp:  36.4 C  Resp:  17    Last Pain:  Filed Vitals:   06/19/15 0155  PainSc: 0-No pain                 Orean Giarratano DAVID

## 2015-06-19 NOTE — Discharge Summary (Signed)
Name: [name] MRN: [id number] DOB: [birth year] 54 y.o. PCP: Ambrose Finland, NP  Date of Admission: 06/18/2015  8:56 PM Date of Discharge: 06/20/2015 Attending Physician: Burns Spain, MD  Discharge Diagnosis: 1. Scrotal Abscess with Cellulitis   Active Problems:   Scrotal abscess   Cellulitis, scrotum   Perineal abscess  Discharge Medications:   Medication List    STOP taking these medications        cephALEXin 500 MG capsule  Commonly known as:  KEFLEX     chlorpheniramine-HYDROcodone 10-8 MG/5ML Suer  Commonly known as:  TUSSIONEX PENNKINETIC ER     clindamycin 150 MG capsule  Commonly known as:  CLEOCIN     HYDROcodone-acetaminophen 5-325 MG tablet  Commonly known as:  NORCO/VICODIN      TAKE these medications        acetaminophen 500 MG tablet  Commonly known as:  TYLENOL  Take 500 mg by mouth every 6 (six) hours as needed for mild pain, moderate pain or fever.     amLODipine 5 MG tablet  Commonly known as:  NORVASC  Take 1 tablet (5 mg total) by mouth daily.     ketorolac 10 MG tablet  Commonly known as:  TORADOL  Take 1 tablet (10 mg total) by mouth every 6 (six) hours as needed.     lisinopril 20 MG tablet  Commonly known as:  PRINIVIL,ZESTRIL  Take 1 tablet (20 mg total) by mouth daily.     metFORMIN 500 MG tablet  Commonly known as:  GLUCOPHAGE  Take 1 tablet (500 mg total) by mouth daily with breakfast.     traMADol 50 MG tablet  Commonly known as:  ULTRAM  Take 1 tablet (50 mg total) by mouth every 12 (twelve) hours as needed.        Disposition and follow-up:   [name] was discharged from [facility] in Good condition.  At the hospital follow up visit please address:  1.  Abscess drainage, need for continued antibiotics  2.  Labs / imaging needed at time of follow-up: none  3.  Pending labs/ test needing follow-up: wound culture  Follow-up Appointments: Follow-up Information    Follow up with  Ambrose Finland, NP. Schedule an appointment as soon as possible for a visit in 1 week.   Specialty:  Internal Medicine   Why:  For wound re-check   Contact information:   [address] [place] [address] [phone]       Discharge Instructions: Discharge Instructions    Call MD for:  redness, tenderness, or signs of infection (pain, swelling, redness, odor or green/yellow discharge around incision site)    Complete by:  As directed      Diet - low sodium heart healthy    Complete by:  As directed      Increase activity slowly    Complete by:  As directed            Consultations: Treatment Team:  Sebastian Ache, MD  Procedures Performed:  Ct Abdomen Pelvis W Contrast  06/19/2015  CLINICAL DATA:  Scrotal cellulitis and drainage from wound. Evaluate for abscess. EXAM: CT ABDOMEN AND PELVIS WITH CONTRAST TECHNIQUE: Multidetector CT imaging of the abdomen and pelvis was performed using the standard protocol following bolus administration of intravenous contrast. CONTRAST:  OMNIPAQUE IOHEXOL 300 MG/ML  SOLN COMPARISON:  None. FINDINGS: Lower chest:  No acute findings. Hepatobiliary: No masses or other  significant abnormality. Pancreas: No mass, inflammatory changes, or other significant abnormality. Spleen: Within normal limits in size and appearance. Adrenals/Urinary Tract: No masses identified. Small right renal cysts noted. No evidence of hydronephrosis. Stomach/Bowel: No evidence of obstruction, inflammatory process, or abnormal fluid collections. Vascular/Lymphatic: No pathologically enlarged lymph nodes. No evidence of abdominal aortic aneurysm. Reproductive: No mass or other significant abnormality. Other: Small paraumbilical ventral hernia seen containing only fat. Edema is seen within the posterior scrotal soft tissues, consistent with cellulitis. There is no evidence of rim enhancing fluid collection or soft tissue gas. Musculoskeletal:  No suspicious bone lesions  identified. IMPRESSION: Edema within posterior scrotal soft tissues, consistent with cellulitis. No evidence of discrete abscess or soft tissue gas. Small paraumbilical ventral hernia containing only fat. Electronically Signed   By: Myles RosenthalJohn  Stahl M.D.   On: 06/19/2015 00:04    2D Echo:   Cardiac Cath:   Admission HPI: Mr. [name] is a 54 yo male with morbid obesity, HTN, and DMII, presenting with 1 day h/o boil to the scrotum. Patient states he had a bath on Monday and everything was normal. On Tuesday morning, he noticed a small swelling, consistent with boil, to the scrotum. By Tuesday evening, it had swollen to approximately 6 cm. At that time, he noticed increased scrotal swelling. He has only had a boil one other time in his life. He denies recurrent boils to the axillae or groin. He denies other sites of infection. He denies fever, chills, chest pain, SOB, N/V, constipation, diarrhea, dysuria, or hematuria.   He was recently admitted in Oct 2016 for RLE cellulitis s/p Keflex. He was then given a course of Clinda by his PCP due to continued yellow discharge from the wound. He reports no issues since completing antibiotics. He denies continued pain or infection to the leg.  In the ED, he had fever to 101F. CT abdomen demonstrated scrotal edema c/w cellulitis without evidence of abscess. Urology consult noted scrotal abscess, now s/p I&D and packing. He currently denies pain in the area.  Hospital Course by problem list: Active Problems:   Scrotal abscess   Cellulitis, scrotum   Perineal abscess   Scrotal Abscess: Patient underwent I&D and packing by Urology on 12/7.  Due to fever and CT findings concerning for cellulitis, he was started on Doxycycline 100 mg BID for 7 day course.  Patient was taught how to appropriately change the bandage and he was discharged with antibiotics.  At follow up, please address wound healing and whether patient needs further antibiotics.  Discharge  Vitals:   BP 134/71 mmHg  Pulse 79  Temp(Src) 98.7 F (37.1 C) (Oral)  Resp 18  Ht 5\' 10"  (1.778 m)  Wt 362 lb (164.202 kg)  BMI 51.94 kg/m2  SpO2 96%  Discharge Labs:  Results for orders placed or performed during the hospital encounter of 06/18/15 (from the past 24 hour(s))  Glucose, capillary     Status: Abnormal   Collection Time: 06/19/15 11:32 AM  Result Value Ref Range   Glucose-Capillary 101 (H) 65 - 99 mg/dL   Comment 1 Repeat Test    Comment 2 Document in Chart   Glucose, capillary     Status: Abnormal   Collection Time: 06/19/15  4:12 PM  Result Value Ref Range   Glucose-Capillary 110 (H) 65 - 99 mg/dL   Comment 1 Repeat Test    Comment 2 Document in Chart   Glucose, capillary     Status: None   Collection  Time: 06/19/15  9:45 PM  Result Value Ref Range   Glucose-Capillary 92 65 - 99 mg/dL  Glucose, capillary     Status: Abnormal   Collection Time: 06/20/15  6:37 AM  Result Value Ref Range   Glucose-Capillary 64 (L) 65 - 99 mg/dL  Glucose, capillary     Status: Abnormal   Collection Time: 06/20/15  6:58 AM  Result Value Ref Range   Glucose-Capillary 101 (H) 65 - 99 mg/dL  Glucose, capillary     Status: None   Collection Time: 06/20/15 11:15 AM  Result Value Ref Range   Glucose-Capillary 90 65 - 99 mg/dL   Comment 1 Repeat Test    Comment 2 Document in Chart     Signed: Jana Half, MD 06/20/2015, 11:27 AM    Services Ordered on Discharge: none Equipment Ordered on Discharge: none

## 2015-06-19 NOTE — Anesthesia Procedure Notes (Signed)
Procedure Name: Intubation Date/Time: 06/19/2015 1:16 AM Performed by: Melina SchoolsBANKS, Leven Hoel J Pre-anesthesia Checklist: Patient identified, Emergency Drugs available, Suction available, Patient being monitored and Timeout performed Patient Re-evaluated:Patient Re-evaluated prior to inductionOxygen Delivery Method: Circle system utilized Preoxygenation: Pre-oxygenation with 100% oxygen Intubation Type: IV induction, Rapid sequence and Cricoid Pressure applied Laryngoscope Size: Mac and 4 Grade View: Grade II Tube type: Oral Tube size: 8.0 mm Number of attempts: 1 Airway Equipment and Method: Stylet Placement Confirmation: ETT inserted through vocal cords under direct vision,  positive ETCO2 and breath sounds checked- equal and bilateral Secured at: 24 cm Tube secured with: Tape Dental Injury: Teeth and Oropharynx as per pre-operative assessment

## 2015-06-19 NOTE — Anesthesia Preprocedure Evaluation (Signed)
Anesthesia Evaluation  Patient identified by MRN, date of birth, ID band Patient awake    Reviewed: Allergy & Precautions, NPO status , Patient's Chart, lab work & pertinent test results  Airway Mallampati: II  TM Distance: >3 FB Neck ROM: Full    Dental   Pulmonary    Pulmonary exam normal        Cardiovascular hypertension, Pt. on medications Normal cardiovascular exam     Neuro/Psych    GI/Hepatic   Endo/Other  diabetes, Type 2, Oral Hypoglycemic Agents  Renal/GU      Musculoskeletal   Abdominal   Peds  Hematology   Anesthesia Other Findings   Reproductive/Obstetrics                             Anesthesia Physical Anesthesia Plan  ASA: III and emergent  Anesthesia Plan: General   Post-op Pain Management:    Induction: Intravenous  Airway Management Planned: Oral ETT  Additional Equipment:   Intra-op Plan:   Post-operative Plan: Extubation in OR  Informed Consent: I have reviewed the patients History and Physical, chart, labs and discussed the procedure including the risks, benefits and alternatives for the proposed anesthesia with the patient or authorized representative who has indicated his/her understanding and acceptance.     Plan Discussed with: CRNA and Surgeon  Anesthesia Plan Comments:         Anesthesia Quick Evaluation

## 2015-06-19 NOTE — Op Note (Signed)
NAMGwynneth Aliment:  Moreno, Christopher                 ACCOUNT NO.:  [id number]  MEDICAL RECORD NO.:  [id number]  LOCATION:  [id number]                        FACILITY:  [facility]  PHYSICIAN:  Sebastian Acheheodore Jon Kasparek, MD     DATE OF BIRTH:  Dec 22, 1960  DATE OF PROCEDURE: 06/19/2015                              OPERATIVE REPORT  PREOPERATIVE DIAGNOSIS:  Scrotal, perineal abscess.  POSTOPERATIVE DIAGNOSIS:  Scrotal, perineal abscess.  PROCEDURE:  Incision and drainage of perineal scrotal abscess.  ESTIMATED BLOOD LOSS:  Nil.  COMPLICATIONS:  None.  SPECIMEN:  Perineal scrotal abscess fluid for Gram stain and culture.  FINDINGS: 1. Approximately 5 cm volume abscess cavity at the perineal-scrotal     junction, no gross involvement of rectum or testes. 2. No evidence of necrotizing soft tissue infection.  INDICATION:  [name] is a 54 year old gentleman with history of morbid obesity and recurrent soft tissue skin infections including cellulitis and multiple skin abscesses.  He was noted on workup of perineal pain, drainage and fever to have likely scrotal abscess.  Given his diabetes and morbid obesity, CT scan was obtained, which did not reveal any subcutaneous gas or obvious necrotizing fasciitis, thus felt to likely represent scrotal, perineal abscess only.  Given his body habitus, it was felt that operative incision and debridement would be most advantageous and complete.  Informed consent was obtained and placed in the medical record.  PROCEDURE IN DETAIL:  The patient being [name], was verified. Procedure being scrotal abscess drainage was confirmed.  Procedure was carried out.  Time-out was performed.  Intravenous antibiotics were administered.  General endotracheal anesthesia was introduced.  The patient was placed into a medium lithotomy position and sterile field was created after taping his copious thigh fat and prepubic fat and scrotum out of the way of his area of perineal, scrotal abscess  and prepping and draping with iodinated prep.  Point of maximum fluctuance was clearly appreciated and this was incised with skin knife and digital exploration was performed of this and an abscess pocket was entered, this containing purulent fluid, a sample of which was set aside for Gram stain and culture for microbiology.  The abscess pocket was approximately 4 cm2.  It did not track into the thigh musculature into the scrotal compartment or toward the rectum.  There were no obvious signs of necrotizing infection.  The edges of this were coagulated with cautery, resulted in excellent hemostasis.  The cavity was packed using iodoform gauze and approximated at its superior and inferior edges using U-stitch nylon with a single intervening interrupted nylon as midpoint to prevent expulsion of packing, and a dressing of ABD pad was applied over this and the procedure was terminated.  The patient tolerated the procedure well.  There were no immediate periprocedural complications.  The patient was taken to the postanesthesia care unit in stable condition with plan for medical admission to verify cultures and afebrile status before discharge.          ______________________________ Sebastian Acheheodore Rosabel Sermeno, MD     TM/MEDQ  D:  06/19/2015  T:  06/19/2015  Job:  [id number]

## 2015-06-19 NOTE — Transfer of Care (Signed)
Immediate Anesthesia Transfer of Care Note  Patient: Christopher DroneBarry L Snowball  Procedure(s) Performed: Procedure(s): INCISION AND DRAINAGE SCROTAL ABSCESS (N/A)  Patient Location: PACU  Anesthesia Type:General  Level of Consciousness: oriented, sedated, patient cooperative and responds to stimulation  Airway & Oxygen Therapy: Patient Spontanous Breathing and Patient connected to nasal cannula oxygen  Post-op Assessment: Report given to RN, Post -op Vital signs reviewed and stable and Patient moving all extremities X 4  Post vital signs: Reviewed and stable  Last Vitals:  Filed Vitals:   06/18/15 2315 06/19/15 0149  BP: 162/95 111/71  Pulse: 93 98  Temp:  36.4 C  Resp:  17    Complications: No apparent anesthesia complications

## 2015-06-19 NOTE — Brief Op Note (Signed)
06/18/2015 - 06/19/2015  1:47 AM  PATIENT:  [name]  54 y.o. male  PRE-OPERATIVE DIAGNOSIS:  scrotal abscess  POST-OPERATIVE DIAGNOSIS:  scrotal abscess  PROCEDURE:  Procedure(s): INCISION AND DRAINAGE SCROTAL ABSCESS (N/A)  SURGEON:  Surgeon(s) and Role:    * Sebastian Acheheodore Maheen Cwikla, MD - Primary  PHYSICIAN ASSISTANT:   ASSISTANTS: none   ANESTHESIA:   general  EBL:     BLOOD ADMINISTERED:none  DRAINS: perineal abscess to packing / woudn drainage   LOCAL MEDICATIONS USED:  NONE  SPECIMEN:  Source of Specimen:  scrotal / perineal abscess fluid  DISPOSITION OF SPECIMEN:  microbiology  COUNTS:  YES  TOURNIQUET:  * No tourniquets in log *  DICTATION: .Other Dictation: Dictation Number [id number]  PLAN OF CARE: Admit to inpatient   PATIENT DISPOSITION:  PACU - hemodynamically stable.   Delay start of Pharmacological VTE agent (>24hrs) due to surgical blood loss or risk of bleeding: not applicable

## 2015-06-19 NOTE — ED Notes (Signed)
Pt wallet, cell phone and ring locked up with security in ED

## 2015-06-19 NOTE — H&P (Signed)
Date: 06/19/2015               Patient Name:  [name] MRN: [id number]  DOB: Jun 06, 1961 Age / Sex: 54 y.o., male   PCP: Ambrose FinlandValerie A Keck, NP         Medical Service: Internal Medicine Teaching Service         Attending Physician: Dr. Burns SpainElizabeth A Butcher, MD    First Contact: Dr. Ladona Ridgelaylor Pager: [phone]  Second Contact: Dr. Senaida Oresichardson Pager: [phone]       After Hours (After 5p/  First Contact Pager: [phone]  weekends / holidays): Second Contact Pager: [phone]   Chief Complaint: scrotal boil, swelling  History of Present Illness: [name] is a 54 yo male with morbid obesity, HTN, and DMII, presenting with 1 day h/o boil to the scrotum. Patient states he had a bath on Monday and everything was normal.  On Tuesday morning, he noticed a small swelling, consistent with boil, to the scrotum.  By Tuesday evening, it had swollen to approximately 6 cm. At that time, he noticed increased scrotal swelling.  He has only had a boil one other time in his life.  He denies recurrent boils to the axillae or groin.  He denies other sites of infection.  He denies fever, chills, chest pain, SOB, N/V, constipation, diarrhea, dysuria, or hematuria.   He was recently admitted in Oct 2016 for RLE cellulitis s/p Keflex.  He was then given a course of Clinda by his PCP due to continued yellow discharge from the wound.  He reports no issues since completing antibiotics.  He denies continued pain or infection to the leg.  In the ED, he had fever to 101F.  CT abdomen demonstrated scrotal edema c/w cellulitis without evidence of abscess.  Urology consult noted scrotal abscess, now s/p I&D and packing.  He currently denies pain in the area.  Meds: Current Facility-Administered Medications  Medication Dose Route Frequency Provider Last Rate Last Dose  . doxycycline (VIBRA-TABS) tablet 100 mg  100 mg Oral Q12H Jana HalfNicholas A Ariq Khamis, MD      . enoxaparin (LOVENOX) injection 80 mg  0.5 mg/kg Subcutaneous Q24H Courtney ParisEden W  Jones, MD      . insulin aspart (novoLOG) injection 0-5 Units  0-5 Units Subcutaneous QHS Courtney ParisEden W Jones, MD      . insulin aspart (novoLOG) injection 0-9 Units  0-9 Units Subcutaneous TID WC Courtney ParisEden W Jones, MD        Allergies: Allergies as of 06/18/2015 - Review Complete 06/18/2015  Allergen Reaction Noted  . Oxycodone Shortness Of Breath 06/05/2012   Past Medical History  Diagnosis Date  . Hypertension   . Skin infection   . Obesity   . Bronchitis   . Cellulitis and abscess of lower extremity 04/11/2015  . Diabetes mellitus without complication (HCC)    History reviewed. No pertinent past surgical history. Family History  Problem Relation Age of Onset  . Diabetes Mother   . Alcohol abuse Father   . Hypertension Brother   . Asthma Sister    Social History   Social History  . Marital Status: Married    Spouse Name: N/A  . Number of Children: N/A  . Years of Education: N/A   Occupational History  . Not on file.   Social History Main Topics  . Smoking status: Never Smoker   . Smokeless tobacco: Never Used  . Alcohol Use: Yes  . Drug Use: No  . Sexual Activity:  Not on file   Other Topics Concern  . Not on file   Social History Narrative    Review of Systems: Pertinent items are noted in HPI.  Physical Exam: Blood pressure 134/65, pulse 86, temperature 98.5 F (36.9 C), temperature source Oral, resp. rate 19, height  (1.778 m), weight 362 lb (164.202 kg), SpO2 98 %. Physical Exam  Constitutional: He is oriented to person, place, and time and well-developed, well-nourished, and in no distress. No distress.  Obese, sitting in bed, NAD  HENT:  Head: Normocephalic and atraumatic.  Eyes: EOM are normal. No scleral icterus.  Neck: Neck supple. No tracheal deviation present.  Cardiovascular: Normal rate, regular rhythm, normal heart sounds and intact distal pulses.   Pulmonary/Chest: Effort normal and breath sounds normal. No respiratory distress. He has no  wheezes.  Abdominal: Soft. He exhibits no distension. There is no tenderness. There is no rebound and no guarding.  Genitourinary:  2-3 cm incision, clean and dry with gauze packing at posterior scrotum.  Gauze and overlying bandage demonstrate serosanguinous drainage without evidence of pus.  No surrounding erythema appreciated.  Musculoskeletal:  1+ pitting edema at ankle.  Neurological: He is alert and oriented to person, place, and time.  Skin: Skin is warm and dry. No rash noted. He is not diaphoretic.     Lab results: Basic Metabolic Panel:  Recent Labs  16/10/96 1930  NA 137  K 4.0  CL 100*  CO2 28  GLUCOSE 120*  BUN 8  CREATININE 0.82  CALCIUM 9.6   Liver Function Tests: No results for input(s): AST, ALT, ALKPHOS, BILITOT, PROT, ALBUMIN in the last 72 hours. No results for input(s): LIPASE, AMYLASE in the last 72 hours. No results for input(s): AMMONIA in the last 72 hours. CBC:  Recent Labs  06/18/15 1930  WBC 10.4  NEUTROABS 7.8*  HGB 13.0  HCT 39.3  MCV 89.5  PLT 157   Cardiac Enzymes: No results for input(s): CKTOTAL, CKMB, CKMBINDEX, TROPONINI in the last 72 hours. BNP: No results for input(s): PROBNP in the last 72 hours. D-Dimer: No results for input(s): DDIMER in the last 72 hours. CBG:  Recent Labs  06/19/15 0151 06/19/15 0622 06/19/15 1132  GLUCAP 98 101* 101*   Hemoglobin A1C: No results for input(s): HGBA1C in the last 72 hours. Fasting Lipid Panel: No results for input(s): CHOL, HDL, LDLCALC, TRIG, CHOLHDL, LDLDIRECT in the last 72 hours. Thyroid Function Tests: No results for input(s): TSH, T4TOTAL, FREET4, T3FREE, THYROIDAB in the last 72 hours. Anemia Panel: No results for input(s): VITAMINB12, FOLATE, FERRITIN, TIBC, IRON, RETICCTPCT in the last 72 hours. Coagulation: No results for input(s): LABPROT, INR in the last 72 hours. Urine Drug Screen: Drugs of Abuse  No results found for: LABOPIA, COCAINSCRNUR, LABBENZ, AMPHETMU,  THCU, LABBARB  Alcohol Level: No results for input(s): ETH in the last 72 hours. Urinalysis: No results for input(s): COLORURINE, LABSPEC, PHURINE, GLUCOSEU, HGBUR, BILIRUBINUR, KETONESUR, PROTEINUR, UROBILINOGEN, NITRITE, LEUKOCYTESUR in the last 72 hours.  Invalid input(s): APPERANCEUR Misc. Labs:   Imaging results:  Ct Abdomen Pelvis W Contrast  06/19/2015  CLINICAL DATA:  Scrotal cellulitis and drainage from wound. Evaluate for abscess. EXAM: CT ABDOMEN AND PELVIS WITH CONTRAST TECHNIQUE: Multidetector CT imaging of the abdomen and pelvis was performed using the standard protocol following bolus administration of intravenous contrast. CONTRAST:  OMNIPAQUE IOHEXOL 300 MG/ML  SOLN COMPARISON:  None. FINDINGS: Lower chest:  No acute findings. Hepatobiliary: No masses or other significant  abnormality. Pancreas: No mass, inflammatory changes, or other significant abnormality. Spleen: Within normal limits in size and appearance. Adrenals/Urinary Tract: No masses identified. Small right renal cysts noted. No evidence of hydronephrosis. Stomach/Bowel: No evidence of obstruction, inflammatory process, or abnormal fluid collections. Vascular/Lymphatic: No pathologically enlarged lymph nodes. No evidence of abdominal aortic aneurysm. Reproductive: No mass or other significant abnormality. Other: Small paraumbilical ventral hernia seen containing only fat. Edema is seen within the posterior scrotal soft tissues, consistent with cellulitis. There is no evidence of rim enhancing fluid collection or soft tissue gas. Musculoskeletal:  No suspicious bone lesions identified. IMPRESSION: Edema within posterior scrotal soft tissues, consistent with cellulitis. No evidence of discrete abscess or soft tissue gas. Small paraumbilical ventral hernia containing only fat. Electronically Signed   By: Myles Rosenthal M.D.   On: 06/19/2015 00:04    Other results: EKG: Marland Kitchen  Assessment & Plan by Problem: Active Problems:    Scrotal abscess   Cellulitis, scrotum   Perineal abscess  Perineal Abscess and Scrotal Cellulitis: Patient 1 day h/o perineal/scrotal abscess and evidence of surrounding cellulitis on CT.  No evidence of fistula, fournier's, or soft tissue air.  Wound is clean and dry s/p I&D and packing by Urology, with serosanguinous drainage without pus.  Cause of abscess formation unknown, but likely multifactorial in the setting of DMII and obesity.  No history of Hidradenitis, anal fistula, or recurrent furuncles.  Due to patient's fever and CT findings of cellulitis, will start 7 day course of Doxy 100 mg BID.  Patient currently denies pain.  - Urology following, appreciate rec's - Tylenol PRN pain - Doxy 100 mg PO BID  FEN/GI:  - Carb mod  DVT Ppx: Lovenox  Dispo: Disposition is deferred at this time, awaiting improvement of current medical problems. Anticipated discharge in approximately 1 day(s).   The patient does have a current PCP Ambrose Finland, NP) and does need an [facility] hospital follow-up appointment after discharge.  The patient does not have transportation limitations that hinder transportation to clinic appointments.  Signed: Jana Half, MD, PhD 06/19/2015, 1:06 PM

## 2015-06-19 NOTE — ED Notes (Signed)
Admitting at bedside 

## 2015-06-20 ENCOUNTER — Encounter (HOSPITAL_COMMUNITY): Payer: Self-pay | Admitting: Urology

## 2015-06-20 DIAGNOSIS — L02215 Cutaneous abscess of perineum: Secondary | ICD-10-CM

## 2015-06-20 LAB — GLUCOSE, CAPILLARY
GLUCOSE-CAPILLARY: 64 mg/dL — AB (ref 65–99)
Glucose-Capillary: 101 mg/dL — ABNORMAL HIGH (ref 65–99)
Glucose-Capillary: 90 mg/dL (ref 65–99)
Glucose-Capillary: 76 mg/dL (ref 65–99)

## 2015-06-20 MED ORDER — DOXYCYCLINE HYCLATE 100 MG PO TABS: 100.0000 mg | ORAL_TABLET | Freq: Two times a day (BID) | ORAL | Status: DC

## 2015-06-20 MED ORDER — TRAMADOL HCL 50 MG PO TABS: 50.0000 mg | ORAL_TABLET | Freq: Two times a day (BID) | ORAL | Status: DC | PRN

## 2015-06-20 NOTE — Progress Notes (Signed)
1 Day Post-Op  Subjective:  1 - Scrotal -Perineal Abscess - s/p operative I+D 06/19/2015. Wound CX pending but GPC likely staph. Initial ER imaging w/o subQ gas.  Today "[name]" is stable. Now afebrile, wants to go home.   Objective: Vital signs in last 24 hours: Temp:  [98.5 F (36.9 C)-98.7 F (37.1 C)] 98.7 F (37.1 C) (12/08 0639) Pulse Rate:  [79-87] 79 (12/08 0639) Resp:  [18] 18 (12/08 0639) BP: (131-134)/(71-73) 134/71 mmHg (12/08 0639) SpO2:  [95 %-96 %] 96 % (12/08 0639) Last BM Date: 06/18/15  Intake/Output from previous day: 12/07 0701 - 12/08 0700 In: 720 [P.O.:720] Out: 2150 [Urine:2150] Intake/Output this shift: Total I/O In: -  Out: 400 [Urine:400]  General appearance: alert, cooperative and appears stated age Head: Normocephalic, without obvious abnormality, atraumatic Nose: Nares normal. Septum midline. Mucosa normal. No drainage or sinus tenderness. Throat: lips, mucosa, and tongue normal; teeth and gums normal Neck: supple, symmetrical, trachea midline Back: symmetric, no curvature. ROM normal. No CVA tenderness. Resp: non-labored on room air Cardio: Nl rate GI: soft, non-tender; bowel sounds normal; no masses,  no organomegaly and morbid truncal obesity. Male genitalia: normal Extremities: extremities normal, atraumatic, no cyanosis or edema Lymph nodes: Cervical, supraclavicular, and axillary nodes normal. Neurologic: Grossly normal Incision/Wound: perineal incision c/d/i. Packing removed and new wet to dry packing placed, teaching pt all steps and having him participate. Non-absorbable Nylon corner stitches to remain in situ.   Lab Results:   Recent Labs  06/18/15 1930  WBC 10.4  HGB 13.0  HCT 39.3  PLT 157   BMET  Recent Labs  06/18/15 1930  NA 137  K 4.0  CL 100*  CO2 28  GLUCOSE 120*  BUN 8  CREATININE 0.82  CALCIUM 9.6   PT/INR No results for input(s): LABPROT, INR in the last 72 hours. ABG No results for input(s): PHART,  HCO3 in the last 72 hours.  Invalid input(s): PCO2, PO2  Studies/Results: Ct Abdomen Pelvis W Contrast  06/19/2015  CLINICAL DATA:  Scrotal cellulitis and drainage from wound. Evaluate for abscess. EXAM: CT ABDOMEN AND PELVIS WITH CONTRAST TECHNIQUE: Multidetector CT imaging of the abdomen and pelvis was performed using the standard protocol following bolus administration of intravenous contrast. CONTRAST:  100mL OMNIPAQUE IOHEXOL 300 MG/ML  SOLN COMPARISON:  None. FINDINGS: Lower chest:  No acute findings. Hepatobiliary: No masses or other significant abnormality. Pancreas: No mass, inflammatory changes, or other significant abnormality. Spleen: Within normal limits in size and appearance. Adrenals/Urinary Tract: No masses identified. Small right renal cysts noted. No evidence of hydronephrosis. Stomach/Bowel: No evidence of obstruction, inflammatory process, or abnormal fluid collections. Vascular/Lymphatic: No pathologically enlarged lymph nodes. No evidence of abdominal aortic aneurysm. Reproductive: No mass or other significant abnormality. Other: Small paraumbilical ventral hernia seen containing only fat. Edema is seen within the posterior scrotal soft tissues, consistent with cellulitis. There is no evidence of rim enhancing fluid collection or soft tissue gas. Musculoskeletal:  No suspicious bone lesions identified. IMPRESSION: Edema within posterior scrotal soft tissues, consistent with cellulitis. No evidence of discrete abscess or soft tissue gas. Small paraumbilical ventral hernia containing only fat. Electronically Signed   By: Myles RosenthalJohn  Stahl M.D.   On: 06/19/2015 00:04    Anti-infectives: Anti-infectives    Start     Dose/Rate Route Frequency Ordered Stop   06/20/15 0000  doxycycline (VIBRA-TABS) 100 MG tablet     100 mg Oral Every 12 hours 06/20/15 1440     06/19/15 1230  doxycycline (VIBRA-TABS) tablet 100 mg     100 mg Oral Every 12 hours 06/19/15 1203     06/19/15 0600  clindamycin  (CLEOCIN) IVPB 600 mg  Status:  Discontinued     600 mg 100 mL/hr over 30 Minutes Intravenous 3 times per day 06/19/15 0231 06/19/15 0240   06/19/15 0015  clindamycin (CLEOCIN) IVPB 900 mg     900 mg 100 mL/hr over 30 Minutes Intravenous  Once 06/19/15 0000 06/19/15 0109      Assessment/Plan:  1 - Scrotal -Perineal Abscess - continue wound packing daily with wet to dry. Pt given supplies for this and his fiancee will help.   Will arrange GU follow-up in about 2 weeks.  Warned to contact MD for new fevers.      [facility], Laticia Vannostrand 06/20/2015

## 2015-06-20 NOTE — Progress Notes (Signed)
Hypoglycemic Event  CBG: 64  Treatment: 15 GM carbohydrate snack   Symptoms: None  Follow-up CBG: Time:0655 CBG Result:101  Possible Reasons for Event: Unknown      [name] M

## 2015-06-20 NOTE — Discharge Instructions (Signed)
1. Change bandages according to Urology directions (daily damp gauze packing) 2. Take Doxycycline 100 mg twice daily until finished.

## 2015-06-20 NOTE — Progress Notes (Signed)
Christopher DroneBarry L Mclaine discharged home per MD order. Discharge instructions reviewed and discussed with patient. All questions and concerns answered. Copy of instructions and scripts given to patient. IV removed.   Patient escorted to car by staff in a wheelchair. No distress noted upon discharge.   [name], [name] R 06/20/2015 5:15 PM

## 2015-06-20 NOTE — Progress Notes (Signed)
Subjective: [name].  Patient has not required pain medication other than Tylenol.  He denies fever, chills, SOB, or CP.  He understands how to change his bandages.    Objective: Vital signs in last 24 hours: Filed Vitals:   06/19/15 0558 06/19/15 1430 06/19/15 1953 06/20/15 0639  BP: 134/65 126/71 131/73 134/71  Pulse: 86 93 87 79  Temp: 98.5 F (36.9 C) 98.4 F (36.9 C) 98.5 F (36.9 C) 98.7 F (37.1 C)  TempSrc: Oral  Oral Oral  Resp: Height:      Weight:      SpO2: 98% 97% 95% 96%   Weight change:   Intake/Output Summary (Last 24 hours) at 06/20/15 0748 Last data filed at 06/20/15 0536  Gross per 24 hour  Intake    480 ml  Output   2150 ml  Net  -1670 ml   Physical Exam  Constitutional: He is oriented to person, place, and time and well-developed, well-nourished, and in no distress.  Obese male, lying in bed, NAD  HENT:  Head: Normocephalic and atraumatic.  Eyes: EOM are normal.  Cardiovascular: Normal rate, regular rhythm and normal heart sounds.   Pulmonary/Chest: Effort normal and breath sounds normal. No respiratory distress. He has no wheezes.  Abdominal: Soft. He exhibits no distension. There is no tenderness. There is no rebound and no guarding.  Musculoskeletal:  Trace peripheral edema to midshin.  Neurological: He is alert and oriented to person, place, and time.  Skin: Skin is warm and dry. No rash noted.  ~3 cm incision to posterior scrotum with gauze packing and overlying bandaging demonstrating serosanguinous drainage.  No pus or surrounding erythema.    Lab Results: Basic Metabolic Panel:  Recent Labs Lab 06/18/15 1930  NA 137  K 4.0  CL 100*  CO2 28  GLUCOSE 120*  BUN 8  CREATININE 0.82  CALCIUM 9.6   Liver Function Tests: No results for input(s): AST, ALT, ALKPHOS, BILITOT, PROT, ALBUMIN in the last 168 hours. No results for input(s): LIPASE, AMYLASE in the last 168 hours. No results for input(s): AMMONIA in the last  168 hours. CBC:  Recent Labs Lab 06/18/15 1930  WBC 10.4  NEUTROABS 7.8*  HGB 13.0  HCT 39.3  MCV 89.5  PLT 157   Cardiac Enzymes: No results for input(s): CKTOTAL, CKMB, CKMBINDEX, TROPONINI in the last 168 hours. BNP: No results for input(s): PROBNP in the last 168 hours. D-Dimer: No results for input(s): DDIMER in the last 168 hours. CBG:  Recent Labs Lab 06/19/15 0622 06/19/15 1132 06/19/15 1612 06/19/15 2145 06/20/15 0637 06/20/15 0658  GLUCAP 101* 101* 110* 92 64* 101*   Hemoglobin A1C: No results for input(s): HGBA1C in the last 168 hours. Fasting Lipid Panel: No results for input(s): CHOL, HDL, LDLCALC, TRIG, CHOLHDL, LDLDIRECT in the last 168 hours. Thyroid Function Tests: No results for input(s): TSH, T4TOTAL, FREET4, T3FREE, THYROIDAB in the last 168 hours. Coagulation: No results for input(s): LABPROT, INR in the last 168 hours. Anemia Panel: No results for input(s): VITAMINB12, FOLATE, FERRITIN, TIBC, IRON, RETICCTPCT in the last 168 hours. Urine Drug Screen: Drugs of Abuse  No results found for: LABOPIA, COCAINSCRNUR, LABBENZ, AMPHETMU, THCU, LABBARB  Alcohol Level: No results for input(s): ETH in the last 168 hours. Urinalysis: No results for input(s): COLORURINE, LABSPEC, PHURINE, GLUCOSEU, HGBUR, BILIRUBINUR, KETONESUR, PROTEINUR, UROBILINOGEN, NITRITE, LEUKOCYTESUR in the last 168 hours.  Invalid input(s): APPERANCEUR Misc. Labs:   Micro Results: No results  found for this or any previous visit (from the past 240 hour(s)). Studies/Results: Ct Abdomen Pelvis W Contrast  06/19/2015  CLINICAL DATA:  Scrotal cellulitis and drainage from wound. Evaluate for abscess. EXAM: CT ABDOMEN AND PELVIS WITH CONTRAST TECHNIQUE: Multidetector CT imaging of the abdomen and pelvis was performed using the standard protocol following bolus administration of intravenous contrast. CONTRAST:  100mL OMNIPAQUE IOHEXOL 300 MG/ML  SOLN COMPARISON:  None. FINDINGS: Lower  chest:  No acute findings. Hepatobiliary: No masses or other significant abnormality. Pancreas: No mass, inflammatory changes, or other significant abnormality. Spleen: Within normal limits in size and appearance. Adrenals/Urinary Tract: No masses identified. Small right renal cysts noted. No evidence of hydronephrosis. Stomach/Bowel: No evidence of obstruction, inflammatory process, or abnormal fluid collections. Vascular/Lymphatic: No pathologically enlarged lymph nodes. No evidence of abdominal aortic aneurysm. Reproductive: No mass or other significant abnormality. Other: Small paraumbilical ventral hernia seen containing only fat. Edema is seen within the posterior scrotal soft tissues, consistent with cellulitis. There is no evidence of rim enhancing fluid collection or soft tissue gas. Musculoskeletal:  No suspicious bone lesions identified. IMPRESSION: Edema within posterior scrotal soft tissues, consistent with cellulitis. No evidence of discrete abscess or soft tissue gas. Small paraumbilical ventral hernia containing only fat. Electronically Signed   By: Myles RosenthalJohn  Stahl M.D.   On: 06/19/2015 00:04   Medications: I have reviewed the patient's current medications. Scheduled Meds: . doxycycline  100 mg Oral Q12H  . enoxaparin (LOVENOX) injection  0.5 mg/kg Subcutaneous Q24H  . insulin aspart  0-5 Units Subcutaneous QHS  . insulin aspart  0-9 Units Subcutaneous TID WC   Continuous Infusions:  PRN Meds:.acetaminophen Assessment/Plan: Active Problems:   Scrotal abscess   Cellulitis, scrotum   Perineal abscess  Perineal Abscess and Scrotal Cellulitis: Patient 1 day h/o perineal/scrotal abscess and evidence of surrounding cellulitis on CT. No evidence of fistula, fournier's, or soft tissue air. Wound is clean and dry s/p I&D and packing by Urology, with serosanguinous drainage without pus. Cause of abscess formation unknown, but likely multifactorial in the setting of DMII and obesity. No history  of Hidradenitis, anal fistula, or recurrent furuncles. Due to patient's fever and CT findings of cellulitis, will start 7 day course of Doxy 100 mg BID. Patient currently denies pain.  He understands how to change his bandages and is ready for discharge. - Urology following, appreciate rec's - Tylenol PRN pain - Tramadol 50 mg q6h PRN pain on discharge - Doxy 100 mg PO BID  FEN/GI:  - Carb mod  DVT Ppx: Lovenox  Dispo: Disposition is deferred at this time, awaiting improvement of current medical problems.  Anticipated discharge in approximately 1 day(s).   The patient does have a current PCP Ambrose Finland(Valerie A Keck, NP) and [facility] after discharge.  The patient does not have transportation limitations that hinder transportation to clinic appointments.  .Services Needed at time of discharge: Y = Yes, Blank = No PT:   OT:   RN:   Equipment:   Other:       Jana HalfNicholas A Christobal Morado, MD 06/20/2015, 7:48 AM

## 2015-06-21 ENCOUNTER — Telehealth: Payer: Self-pay

## 2015-06-21 NOTE — Telephone Encounter (Signed)
Spoke with patient this afternoon  Patient picked up his prescription for his antibiotic last nite

## 2015-06-21 NOTE — Telephone Encounter (Signed)
-----   Message from Ambrose FinlandValerie A Keck, NP sent at 06/21/2015  2:24 PM EST ----- Call this patient and make sure he completed antibiotics while in hospital, did they send him home with any? Scrotal culture grew back bacteria

## 2015-06-22 LAB — CULTURE, ROUTINE-ABSCESS

## 2015-06-24 LAB — CULTURE, BLOOD (ROUTINE X 2)
CULTURE: NO GROWTH
Culture: NO GROWTH

## 2015-06-24 LAB — ANAEROBIC CULTURE

## 2015-07-31 MED FILL — ?AMLODIPINE BESYLATE 5 MG T: 5 | 30 days supply | Qty: 30 | Fill #4

## 2015-07-31 MED FILL — ?METFORMIN HCL 500MG TABLET: 500 | 30 days supply | Qty: 30 | Fill #3

## 2015-07-31 MED FILL — ?LISINOPRIL 20 MG TABLET: 20 | 30 days supply | Qty: 30 | Fill #4

## 2015-08-21 MED FILL — traMADol HCL 50 MG TABS: 50 | 30 days supply | Qty: 60 | Fill #1

## 2015-09-04 ENCOUNTER — Encounter: Payer: Self-pay | Admitting: Internal Medicine

## 2015-09-04 ENCOUNTER — Ambulatory Visit: Payer: BLUE CROSS/BLUE SHIELD | Attending: Internal Medicine | Admitting: Internal Medicine

## 2015-09-04 VITALS — BP 116/83 | HR 83 | Temp 98.0°F | Resp 16 | Ht 70.0 in | Wt 360.0 lb

## 2015-09-04 DIAGNOSIS — M25562 Pain in left knee: Secondary | ICD-10-CM | POA: Diagnosis present

## 2015-09-04 DIAGNOSIS — M25561 Pain in right knee: Secondary | ICD-10-CM | POA: Diagnosis present

## 2015-09-04 DIAGNOSIS — Z79899 Other long term (current) drug therapy: Secondary | ICD-10-CM | POA: Diagnosis not present

## 2015-09-04 DIAGNOSIS — I10 Essential (primary) hypertension: Secondary | ICD-10-CM | POA: Diagnosis not present

## 2015-09-04 DIAGNOSIS — E663 Overweight: Secondary | ICD-10-CM | POA: Diagnosis not present

## 2015-09-04 DIAGNOSIS — E119 Type 2 diabetes mellitus without complications: Secondary | ICD-10-CM | POA: Insufficient documentation

## 2015-09-04 DIAGNOSIS — M170 Bilateral primary osteoarthritis of knee: Secondary | ICD-10-CM | POA: Diagnosis not present

## 2015-09-04 DIAGNOSIS — Z794 Long term (current) use of insulin: Secondary | ICD-10-CM

## 2015-09-04 LAB — POCT GLYCOSYLATED HEMOGLOBIN (HGB A1C): HEMOGLOBIN A1C: 5.4

## 2015-09-04 LAB — GLUCOSE, POCT (MANUAL RESULT ENTRY): POC GLUCOSE: 92 mg/dL (ref 70–99)

## 2015-09-04 MED ORDER — AMLODIPINE BESYLATE 5 MG PO TABS: 5.0000 mg | ORAL_TABLET | Freq: Every day | ORAL | Status: DC

## 2015-09-04 MED ORDER — METFORMIN HCL 500 MG PO TABS: 500.0000 mg | ORAL_TABLET | Freq: Every day | ORAL | Status: DC

## 2015-09-04 MED ORDER — LISINOPRIL 20 MG PO TABS: 20.0000 mg | ORAL_TABLET | Freq: Every day | ORAL | Status: DC

## 2015-09-04 NOTE — Progress Notes (Signed)
Patient complains of bilateral knee pain Patient states the tramadol is not helping

## 2015-09-04 NOTE — Patient Instructions (Signed)
I have placed referral to Orthopedics. They will call you very soon for a appointment. If you have not heard from them by this time next week call me back to make sure it was approved.

## 2015-09-04 NOTE — Progress Notes (Signed)
Patient ID: [name], male   DOB: 02-Dec-1960, 55 y.o.   MRN: [id number]  CC: bilateral knee pain  HPI: [name] is a 55 y.o. male here today for a follow up visit.  Patient has past medical history of obesity, osteoarthritis, diabetes, and HTN.  Patient reports that he has continued to have bilateral knee pain for several months despite OTC analgesics. He has progressed to working out 3 times per week but is only able to use the elliptical machine due to pain.  He reports that he now has insurance and would like to consult with a Orthopedist about his pain. Patient reports that he has been taking his Metformin daily without GI upset or hypoglycemia. He denies symptoms of numbness, polyuria, polydipsia, or blurred vision. He is up to date on his eye exam.  Allergies  Allergen Reactions  . Oxycodone Shortness Of Breath   Past Medical History  Diagnosis Date  . Hypertension   . Skin infection   . Obesity   . Bronchitis   . Cellulitis and abscess of lower extremity 04/11/2015  . Diabetes mellitus without complication [facility])    Current Outpatient Prescriptions on File Prior to Visit  Medication Sig Dispense Refill  . acetaminophen (TYLENOL) 500 MG tablet Take 500 mg by mouth every 6 (six) hours as needed for mild pain, moderate pain or fever.     Marland Kitchen amLODipine (NORVASC) 5 MG tablet Take 1 tablet (5 mg total) by mouth daily. 30 tablet 4  . lisinopril (PRINIVIL,ZESTRIL) 20 MG tablet Take 1 tablet (20 mg total) by mouth daily. 30 tablet 4  . metFORMIN (GLUCOPHAGE) 500 MG tablet Take 1 tablet (500 mg total) by mouth daily with breakfast. 30 tablet 4  . traMADol (ULTRAM) 50 MG tablet Take 1 tablet (50 mg total) by mouth every 12 (twelve) hours as needed. 30 tablet 0  . doxycycline (VIBRA-TABS) 100 MG tablet Take 1 tablet (100 mg total) by mouth every 12 (twelve) hours. (Patient not taking: Reported on 09/04/2015) 13 tablet 0  . ketorolac (TORADOL) 10 MG tablet Take 1 tablet (10 mg total) by  mouth every 6 (six) hours as needed. (Patient not taking: Reported on 09/04/2015) 20 tablet 0   No current facility-administered medications on file prior to visit.   Family History  Problem Relation Age of Onset  . Diabetes Mother   . Alcohol abuse Father   . Hypertension Brother   . Asthma Sister    Social History   Social History  . Marital Status: Married    Spouse Name: N/A  . Number of Children: N/A  . Years of Education: N/A   Occupational History  . Not on file.   Social History Main Topics  . Smoking status: Never Smoker   . Smokeless tobacco: Never Used  . Alcohol Use: Yes  . Drug Use: No  . Sexual Activity: Not on file   Other Topics Concern  . Not on file   Social History Narrative    Review of Systems: Other than what is stated in HPI, all other systems are negative.   Objective:   Filed Vitals:   09/04/15 1434  BP: 116/83  Pulse: 103  Temp: 98 F (36.7 C)  Resp: 16    Physical Exam  Constitutional: He is oriented to person, place, and time.  Morbidly obese  Cardiovascular: Normal rate, regular rhythm and normal heart sounds.   Pulmonary/Chest: Effort normal and breath sounds normal.  Musculoskeletal: Normal range of motion.  He exhibits no edema or tenderness (or swelling of bilateral knees).  Neurological: He is alert and oriented to person, place, and time.  Skin: Skin is warm and dry.  Psychiatric: He has a normal mood and affect.     Lab Results  Component Value Date   WBC 10.4 06/18/2015   HGB 13.0 06/18/2015   HCT 39.3 06/18/2015   MCV 89.5 06/18/2015   PLT 157 06/18/2015   Lab Results  Component Value Date   CREATININE 0.82 06/18/2015   BUN 8 06/18/2015   NA 137 06/18/2015   K 4.0 06/18/2015   CL 100* 06/18/2015   CO2 28 06/18/2015    Lab Results  Component Value Date   HGBA1C 5.4 09/04/2015   Lipid Panel     Component Value Date/Time   CHOL 187 04/14/2015 0439   TRIG 151* 04/14/2015 0439   HDL 29* 04/14/2015  0439   CHOLHDL 6.4 04/14/2015 0439   VLDL 30 04/14/2015 0439   LDLCALC 128* 04/14/2015 0439       Assessment and plan:   [name] was seen today for knee pain.  Diagnoses and all orders for this visit:  Osteoarthritis of both knees, unspecified osteoarthritis type -     Ambulatory referral to Orthopedic Surgery Explained that being overweight is contributing to his knee pain. Explained that weight loss may help with symptoms.   Type 2 diabetes mellitus without complication, with long-term current use of insulin (HCC) -     Glucose (CBG) -     HgB A1c -     metFORMIN (GLUCOPHAGE) 500 MG tablet; Take 1 tablet (500 mg total) by mouth daily with breakfast. Patients diabetes is well control as evidence by consistently low a1c.  Patient will continue with current therapy and continue to make necessary lifestyle changes.  Reviewed foot care, diet, exercise, annual health maintenance with patient.   Essential hypertension -     lisinopril (PRINIVIL,ZESTRIL) 20 MG tablet; Take 1 tablet (20 mg total) by mouth daily. -     amLODipine (NORVASC) 5 MG tablet; Take 1 tablet (5 mg total) by mouth daily. Patient blood pressure is stable and may continue on current medication.  Education on diet, exercise, and modifiable risk factors discussed. Will obtain appropriate labs as needed. Will follow up in 3-6 months.   Morbid obesity, unspecified obesity type (HCC) Weight loss discussed at length and its complications to health.  Patient will loss 10 pounds by next visit in 3 months.  Diet and exercise discussed as well as calorie intake.   Marland KitchenReturn in about 6 months (around 03/03/2016) for DM/HTN.       Ambrose Finland, NP-C [facility] [phone] 09/04/2015, 2:43 PM'

## 2015-09-18 ENCOUNTER — Ambulatory Visit (INDEPENDENT_AMBULATORY_CARE_PROVIDER_SITE_OTHER): Payer: BLUE CROSS/BLUE SHIELD | Admitting: Sports Medicine

## 2015-09-18 ENCOUNTER — Encounter: Payer: Self-pay | Admitting: Sports Medicine

## 2015-09-18 VITALS — BP 160/94 | Ht 70.0 in | Wt 355.0 lb

## 2015-09-18 DIAGNOSIS — M25562 Pain in left knee: Secondary | ICD-10-CM

## 2015-09-18 DIAGNOSIS — M25561 Pain in right knee: Secondary | ICD-10-CM | POA: Diagnosis not present

## 2015-09-18 DIAGNOSIS — M170 Bilateral primary osteoarthritis of knee: Secondary | ICD-10-CM | POA: Diagnosis not present

## 2015-09-18 MED ORDER — METHYLPREDNISOLONE ACETATE 40 MG/ML IJ SUSP
40.0000 mg | Freq: Once | INTRAMUSCULAR | Status: AC
  Administered 2015-09-18: 40 mg via INTRA_ARTICULAR

## 2015-09-18 NOTE — Progress Notes (Signed)
Subjective:    Patient ID: [name], male    DOB: 10/14/1960, 55 y.o.   MRN: [id number]  [facility]PI [facility] pain.  Patient reports that pain started several years ago.  He denies injury but notes that he weighed about 560lbs at that time.  He had imaging done and was noted to have OA in bilateral knees with cartilage loss in L knee.  He reports that pain is achy in nature and is worse with getting up from a seated position/ rest.  He feels like he has to "warm up the joint" before he is able to walk comfortably.  He notes that pain is still present but to a lesser degree.  He has had knee injections in the past that helped.  He has also taken Norco, Tramadol, Aleve in the past.  Norco helped the most.  He still occ takes one of his Mother in Law's pills when he is in severe pain.  He reports that he is on his feet a lot.  He works at [employer] and is on his feet up to 60 hours per week on a concrete floor. Wears Sketchers shoes which help some.  Also has shoe inserts that he uses on another pair of shoes that he obtained from a medical supply store.  No locking, weakness, numbness, tingling.  Endorses occ swelling and popping of bilateral knees.  Review of Systems Per HPI     Objective:   Physical Exam Gen: awake, alert, obese, NAD MSK: slow slightly wide gait,   L knee: no effusion, no erythema, no bony abnormalities, loss of 1-2 degrees in extension otherwise FROM, no ligamentous laxity, +palpable crepitus, no joint line or patellar tenderness to palpation, no popliteal tenderness or masses, 5/5 strength, + thessaly  R knee:  Small effusion, no erythema, no bony abnormalities, loss of 1-2 degrees in extension otherwise FROM, no ligamentous laxity, +palpable crepitus (more than compared to left), no joint line tenderness to palpation, +mild TTP to patellar tendon, no popliteal tenderness or masses, 5/5 strength, negative thessaly Neuro: light touch  sensation grossly in tact bilaterally.    Right knee INJECTION: Patient was given informed consent, signed copy in the chart. Appropriate time out was taken. Area prepped 3 alcohol swabs. Gebauer's numbing spray was applied and 1 cc of methylprednisolone 40 mg/ml plus  3 cc of 1% lidocaine without epinephrine was injected into the right knee using a(n) anteriolateral approach. The patient tolerated the procedure well. There were no complications. Post procedure instructions were given.  Left knee INJECTION: Patient was given informed consent, signed copy in the chart. Appropriate time out was taken. Area prepped using 3 alcohol swabs. Gebauer's numbing spray was applied and 1 cc of methylprednisolone 40 mg/ml plus  3 cc of 1% lidocaine without epinephrine was injected into the left knee using a(n) anteriolateral approach. The patient tolerated the procedure well. There were no complications. Post procedure instructions were given.  Assessment & Plan:   1. Knee pain, bilateral. Likely OA. No focal neurologic deficits on exam.  No bony abnormalities.  No weakness.  + Thessaly on L, consider possible meniscal involvement.  Bilateral knee injections performed.  Patient tolerated these well. - methylPREDNISolone acetate (DEPO-MEDROL) injection 40 mg; Inject 1 mL (40 mg total) into the articular space once. - methylPREDNISolone acetate (DEPO-MEDROL) injection 40 mg; Inject 1 mL (40 mg total) into the articular space once. - DG Knee Bilateral Standing AP; Future -  Defer narcotic medications to PCP.  May continue NSAIDs/ Tylenol prn pain - Patient to return in 4 weeks to discuss xrays. Patient will also bring in his work shoes for me to evaluate arch support and cushioning.  - If responds well to knee injection, will consider repeating in 6 months if needed.  2. Primary osteoarthritis of both knees - Continue weight loss, patient has successfully lost about 180 lbs with diet and exercise.  Patient seen  and evaluated with the resident. I agree with the above plan of care. Each of his knees were injected today with cortisone utilizing an anterior lateral approach. He tolerated this without difficulty. Return to the office in 4 weeks for reevaluation. He we'll get standing x-rays of his knee prior to that visit. He will also bring in his work shoes for me to evaluate. He may need our support an additional cushioning.  Consent obtained and verified. Time-out conducted. Noted no overlying erythema, induration, or other signs of local infection. Skin prepped in a sterile fashion. Topical analgesic spray: Ethyl chloride. Joint: left knee Needle: 22g 1.5 inch Completed without difficulty. Meds: 3cc 1% xylocaine, 1cc ( ) depomedrol  Advised to call if fevers/chills, erythema, induration, drainage, or persistent bleeding.  Consent obtained and verified. Time-out conducted. Noted no overlying erythema, induration, or other signs of local infection. Skin prepped in a sterile fashion. Topical analgesic spray: Ethyl chloride. Joint: right knee Needle: 22g 1.5 inch Completed without difficulty. Meds: 3cc 1% xylocaine, 1cc ( ) depomedrol  Advised to call if fevers/chills, erythema, induration, drainage, or persistent bleeding.

## 2015-10-16 ENCOUNTER — Ambulatory Visit: Payer: BLUE CROSS/BLUE SHIELD | Admitting: Sports Medicine

## 2016-02-15 ENCOUNTER — Ambulatory Visit
Admission: EM | Admit: 2016-02-15 | Discharge: 2016-02-15 | Disposition: A | Discharge status: Home / Self Care | Payer: BLUE CROSS/BLUE SHIELD | Attending: Family Medicine | Admitting: Family Medicine

## 2016-02-15 ENCOUNTER — Ambulatory Visit (INDEPENDENT_AMBULATORY_CARE_PROVIDER_SITE_OTHER): Payer: BLUE CROSS/BLUE SHIELD

## 2016-02-15 ENCOUNTER — Encounter: Payer: Self-pay | Admitting: Gynecology

## 2016-02-15 DIAGNOSIS — M170 Bilateral primary osteoarthritis of knee: Secondary | ICD-10-CM

## 2016-02-15 DIAGNOSIS — M25561 Pain in right knee: Secondary | ICD-10-CM

## 2016-02-15 DIAGNOSIS — M25562 Pain in left knee: Secondary | ICD-10-CM | POA: Diagnosis not present

## 2016-02-15 MED ORDER — DEXAMETHASONE SODIUM PHOSPHATE 10 MG/ML IJ SOLN
20.0000 mg | Freq: Once | INTRAMUSCULAR | Status: AC
  Administered 2016-02-15: 20 mg via INTRA_ARTICULAR

## 2016-02-15 MED ORDER — LIDOCAINE HCL (PF) 1 % IJ SOLN
5.0000 mL | Freq: Once | INTRAMUSCULAR | Status: AC
  Administered 2016-02-15: 5 mL

## 2016-02-15 MED ORDER — MELOXICAM 15 MG PO TABS
7.5000 mg | ORAL_TABLET | Freq: Every day | ORAL | 0 refills | Status: DC
Start: 2016-02-15 — End: 2017-05-23

## 2016-02-15 NOTE — ED Provider Notes (Signed)
CSN: [id number]     Arrival date & time 02/15/16  1420 History   First MD Initiated Contact with Patient 02/15/16 1511     Chief Complaint  Patient presents with  . Knee Pain   HPI patient presents today for evaluation of bilateral knee pain, right knee worse than left. Patient has a history of knee osteoarthritis however patient suffered a twisting injury to the right knee yesterday which caused increasing swelling and tenderness primarily at night. He reports pain along the medial as well as lateral joint, denies any loss of range of motion. He denies any catching or locking in his knees. Denies the numbness or tingling to bilateral lower extremities. He presents today requesting a steroid injection. Past Medical History:  Diagnosis Date  . Bronchitis   . Cellulitis and abscess of lower extremity 04/11/2015  . Diabetes mellitus without complication (HCC)   . Hypertension   . Obesity   . Skin infection    Past Surgical History:  Procedure Laterality Date  . INCISION AND DRAINAGE ABSCESS N/A 06/19/2015   Procedure: INCISION AND DRAINAGE SCROTAL ABSCESS;  Surgeon: Sebastian Ache, MD;  Location: [facility] OR;  Service: Urology;  Laterality: N/A;   Family History  Problem Relation Age of Onset  . Diabetes Mother   . Alcohol abuse Father   . Hypertension Brother   . Asthma Sister    Social History  Substance Use Topics  . Smoking status: Never Smoker  . Smokeless tobacco: Never Used  . Alcohol use Yes    Review of Systems  Constitutional: Negative.   HENT: Negative.   Eyes: Negative.   Respiratory: Negative.   Cardiovascular: Negative.   Gastrointestinal: Negative.   Endocrine: Negative.   Genitourinary: Negative.   Musculoskeletal: Positive for arthralgias, gait problem, joint swelling and myalgias.  Skin: Negative.   Allergic/Immunologic: Negative.   Hematological: Negative.   Psychiatric/Behavioral: Negative.     Allergies  Oxycodone  Home Medications   Prior to  Admission medications   Medication Sig Start Date End Date Taking? Authorizing Provider  acetaminophen (TYLENOL) 500 MG tablet Take 500 mg by mouth every 6 (six) hours as needed for mild pain, moderate pain or fever.  04/06/14  Yes Fayrene Helper, PA-C  amLODipine (NORVASC) 5 MG tablet Take 1 tablet (5 mg total) by mouth daily. 09/04/15  Yes Ambrose Finland, NP  lisinopril (PRINIVIL,ZESTRIL) 20 MG tablet Take 1 tablet (20 mg total) by mouth daily. 09/04/15  Yes Ambrose Finland, NP  metFORMIN (GLUCOPHAGE) 500 MG tablet Take 1 tablet (500 mg total) by mouth daily with breakfast. 09/04/15  Yes Ambrose Finland, NP  traMADol (ULTRAM) 50 MG tablet Take 1 tablet (50 mg total) by mouth every 12 (twelve) hours as needed. 06/20/15  Yes Jana Half, MD  doxycycline (VIBRA-TABS) 100 MG tablet Take 1 tablet (100 mg total) by mouth every 12 (twelve) hours. Patient not taking: Reported on 09/04/2015 06/20/15   Jana Half, MD  ketorolac (TORADOL) 10 MG tablet Take 1 tablet (10 mg total) by mouth every 6 (six) hours as needed. Patient not taking: Reported on 09/04/2015 04/15/14   Kathlen Mody, MD  meloxicam (MOBIC) 15 MG tablet Take 0.5 tablets (7.5 mg total) by mouth daily. 02/15/16   Anson Oregon, PA-C   Meds Ordered and Administered this Visit   Medications  lidocaine (PF) (XYLOCAINE) 1 % injection 5 mL (5 mLs Other Given 02/15/16 1619)  lidocaine (PF) (XYLOCAINE) 1 % injection 5 mL (5  mLs Other Given 02/15/16 1617)  dexamethasone (DECADRON) injection 20 mg (20 mg Intra-articular Given 02/15/16 1618)  dexamethasone (DECADRON) injection 20 mg (20 mg Intra-articular Given 02/15/16 1616)    BP (!) 162/86 (BP Location: Left Arm)   Pulse (!) 107   Temp 98.6 F (37 C) (Oral)   Resp 18   Ht  (1.778 m)   Wt (!) 365 lb (165.6 kg)   SpO2 98%   BMI 52.37 kg/m  No data found.  Physical Exam   Bilateral Lower Extremities: Examination of the bilateral lower extremity reveals no bony abnormality, no edema,  mild effusion and no ecchymosis.  There is no valgus or varus abnormality.  The patient is moderately tender along the lateral joint line, and is moderately tender along the medial joint line.  The patient can flex to 95 degrees bilaterally and fully extend with moderate bony crepitus. There is moderate discomfort with range of motion exercises.  The patient has a positive rotational Mcmurray test.  The patient has a negative patella stretch test.  The patient has a negative varus stress test and a negative valgus stress test, in looking for stability.  The patient has a negative Lachman's test.  Vascular: The patient has a negative Denna Haggard' test bilaterally.  The patient had a normal dorsalis pedis and posterior tibial pulse.  There is normal skin warmth.  There is normal capillary refill bilaterally.    Neurologic: The patient has a negative straight leg raise.  The patient has normal muscle strength testing for the quadriceps, calves, ankle dorsiflexion, ankle plantarflexion, and extensor hallicus longus.  The patient has sensation that is intact to light touch.  The deep tendon reflexes are normal at the  Urgent Care Course   Clinical Course    Injection of joint Date/Time: 02/15/2016 4:40 PM Performed by: Anson Oregon Authorized by: Hassan Rowan  Consent: Verbal consent obtained. Written consent not obtained. Risks and benefits: risks, benefits and alternatives were discussed Patient understanding: patient states understanding of the procedure being performed Patient consent: the patient's understanding of the procedure matches consent given Procedure consent: procedure consent matches procedure scheduled Relevant documents: relevant documents present and verified Test results: test results available and properly labeled Site marked: the operative site was marked Imaging studies: imaging studies available Patient identity confirmed: verbally with patient Local anesthesia used:  no  Anesthesia: Local anesthesia used: no Patient tolerance: Patient tolerated the procedure well with no immediate complications Comments: Bilateral knees injected using 2 cc's of dexamethasone and 5 cc's of 1% lidocaine.    (including critical care time)  Labs Review Labs Reviewed - No data to display  Imaging Review Dg Knee Bilateral Standing Ap  Result Date: 02/15/2016 CLINICAL DATA:  Pain without trauma. EXAM: BILATERAL KNEES STANDING - 1 VIEW COMPARISON:  None. FINDINGS: No acute fracture or dislocation. No joint effusion. Severe medial compartment degenerative changes with severe loss of joint space. There are degenerative changes in the patellofemoral compartments as well with relative sparing of the lateral compartments. IMPRESSION: Severe medial compartment degenerative changes bilaterally. No fracture, dislocation, or effusion. Electronically Signed   By: Gerome Sam III M.D   On: 02/15/2016 16:37    MDM   1. Primary osteoarthritis of both knees   2. Knee pain, bilateral   3. Bilateral knee pain    1.  Treatment options discussed today with the patient. 2.  Bilateral knee steroid injections performed, Meloxicam prescribed  daily. 3.  Follow-up with Orthopaedics in  10-14 days if no relief.    Anson [place], [place] 02/15/16 [phone]

## 2016-02-15 NOTE — ED Triage Notes (Signed)
Patient c/o bilateral knee pain.

## 2016-10-27 DIAGNOSIS — E089 Diabetes mellitus due to underlying condition without complications: Secondary | ICD-10-CM | POA: Diagnosis not present

## 2016-10-27 DIAGNOSIS — I10 Essential (primary) hypertension: Secondary | ICD-10-CM | POA: Diagnosis not present

## 2016-10-27 DIAGNOSIS — E669 Obesity, unspecified: Secondary | ICD-10-CM | POA: Diagnosis not present

## 2016-10-27 DIAGNOSIS — M25569 Pain in unspecified knee: Secondary | ICD-10-CM | POA: Diagnosis not present

## 2016-11-09 DIAGNOSIS — E089 Diabetes mellitus due to underlying condition without complications: Secondary | ICD-10-CM | POA: Diagnosis not present

## 2016-11-24 DIAGNOSIS — M170 Bilateral primary osteoarthritis of knee: Secondary | ICD-10-CM | POA: Diagnosis not present

## 2016-11-24 DIAGNOSIS — E669 Obesity, unspecified: Secondary | ICD-10-CM | POA: Diagnosis not present

## 2016-11-24 DIAGNOSIS — I10 Essential (primary) hypertension: Secondary | ICD-10-CM | POA: Diagnosis not present

## 2016-11-24 DIAGNOSIS — R7303 Prediabetes: Secondary | ICD-10-CM | POA: Diagnosis not present

## 2016-11-24 DIAGNOSIS — M25569 Pain in unspecified knee: Secondary | ICD-10-CM | POA: Diagnosis not present

## 2017-04-09 ENCOUNTER — Emergency Department (HOSPITAL_COMMUNITY): Payer: BLUE CROSS/BLUE SHIELD

## 2017-04-09 ENCOUNTER — Emergency Department (HOSPITAL_COMMUNITY)
Admission: EM | Admit: 2017-04-09 | Discharge: 2017-04-09 | Disposition: A | Discharge status: Home / Self Care | Payer: BLUE CROSS/BLUE SHIELD | Attending: Emergency Medicine | Admitting: Emergency Medicine

## 2017-04-09 ENCOUNTER — Encounter (HOSPITAL_COMMUNITY): Payer: Self-pay | Admitting: *Deleted

## 2017-04-09 DIAGNOSIS — Z79899 Other long term (current) drug therapy: Secondary | ICD-10-CM | POA: Diagnosis not present

## 2017-04-09 DIAGNOSIS — Z7984 Long term (current) use of oral hypoglycemic drugs: Secondary | ICD-10-CM | POA: Insufficient documentation

## 2017-04-09 DIAGNOSIS — J40 Bronchitis, not specified as acute or chronic: Secondary | ICD-10-CM | POA: Insufficient documentation

## 2017-04-09 DIAGNOSIS — I10 Essential (primary) hypertension: Secondary | ICD-10-CM

## 2017-04-09 DIAGNOSIS — J208 Acute bronchitis due to other specified organisms: Secondary | ICD-10-CM | POA: Diagnosis not present

## 2017-04-09 DIAGNOSIS — E119 Type 2 diabetes mellitus without complications: Secondary | ICD-10-CM | POA: Diagnosis not present

## 2017-04-09 DIAGNOSIS — Z794 Long term (current) use of insulin: Secondary | ICD-10-CM

## 2017-04-09 DIAGNOSIS — R05 Cough: Secondary | ICD-10-CM | POA: Diagnosis not present

## 2017-04-09 DIAGNOSIS — R7303 Prediabetes: Secondary | ICD-10-CM

## 2017-04-09 MED ORDER — ALBUTEROL SULFATE (2.5 MG/3ML) 0.083% IN NEBU
5.0000 mg | INHALATION_SOLUTION | Freq: Once | RESPIRATORY_TRACT | Status: AC
  Administered 2017-04-09: 5 mg via RESPIRATORY_TRACT
  Filled 2017-04-09: qty 6

## 2017-04-09 MED ORDER — ALBUTEROL SULFATE HFA 108 (90 BASE) MCG/ACT IN AERS
2.0000 | INHALATION_SPRAY | RESPIRATORY_TRACT | Status: DC | PRN
  Administered 2017-04-09: 2 via RESPIRATORY_TRACT
  Filled 2017-04-09: qty 6.7

## 2017-04-09 MED ORDER — AZITHROMYCIN 250 MG PO TABS: 250.0000 mg | ORAL_TABLET | Freq: Every day | ORAL | 0 refills | Status: DC

## 2017-04-09 MED ORDER — LISINOPRIL 20 MG PO TABS: 20.0000 mg | ORAL_TABLET | Freq: Every day | ORAL | 4 refills | Status: DC

## 2017-04-09 MED ORDER — AMLODIPINE BESYLATE 5 MG PO TABS: 5.0000 mg | ORAL_TABLET | Freq: Every day | ORAL | 4 refills | Status: DC

## 2017-04-09 MED ORDER — METFORMIN HCL 500 MG PO TABS: 500.0000 mg | ORAL_TABLET | Freq: Every day | ORAL | 4 refills | Status: DC

## 2017-04-09 MED ORDER — IPRATROPIUM BROMIDE 0.02 % IN SOLN
0.5000 mg | Freq: Once | RESPIRATORY_TRACT | Status: AC
  Administered 2017-04-09: 0.5 mg via RESPIRATORY_TRACT
  Filled 2017-04-09: qty 2.5

## 2017-04-09 NOTE — ED Triage Notes (Signed)
Pt states he is coughing up green/brown sputum and states he has bronchitis and states cough/wheeze all night.  Pt denies chest pain or sob

## 2017-04-09 NOTE — ED Provider Notes (Signed)
[facility]-EMERGENCY DEPT Provider Note   CSN: [id number] Arrival date & time: 04/09/17  1256   History   Chief Complaint Chief Complaint  Patient presents with  . Cough    HPI [name] is a 56 y.o. male with hx of incision and drainage of a scrotal abscess, perineal abscess and sepsis. Has a chronic hx of diabetes, hypertension and obesity comes to the ER today for cough. He has taken a decongestant and theraflu at home without any improvement. He was sick three weeks ago and it went away on its own and then he recently had his symptoms return.  HPI   He says that he has been coughing up green/brown sputum and has had associated wheezing. He has not had chest pain or shortness of breath. He denies lower extremity swelling, fevers, back pain, weakness or fatigue.   Past Medical History:  Diagnosis Date  . Bronchitis   . Cellulitis and abscess of lower extremity 04/11/2015  . Diabetes mellitus without complication (HCC)   . Hypertension   . Obesity   . Skin infection     Patient Active Problem List   Diagnosis Date Noted  . Scrotal abscess 06/19/2015  . Cellulitis, scrotum 06/19/2015  . Perineal abscess 06/19/2015  . Cellulitis and abscess of leg 04/13/2015  . Prediabetes 04/23/2014  . Morbid obesity with BMI of 50.0-59.9, adult (HCC) 04/12/2014  . Cellulitis 04/09/2014  . Cellulitis of left leg 04/09/2014  . Sepsis (HCC) 04/09/2014  . Hypertension   . Obesity   . Bronchitis     Past Surgical History:  Procedure Laterality Date  . INCISION AND DRAINAGE ABSCESS N/A 06/19/2015   Procedure: INCISION AND DRAINAGE SCROTAL ABSCESS;  Surgeon: Sebastian Ache, MD;  Location: [facility] OR;  Service: Urology;  Laterality: N/A;       Home Medications    Prior to Admission medications   Medication Sig Start Date End Date Taking? Authorizing Provider  acetaminophen (TYLENOL) 500 MG tablet Take 500 mg by mouth every 6 (six) hours as needed for mild pain, moderate pain or fever.   04/06/14   Fayrene Helper, PA-C  amLODipine (NORVASC) 5 MG tablet Take 1 tablet (5 mg total) by mouth daily. 04/09/17   Marlon Pel, PA-C  azithromycin (ZITHROMAX) 250 MG tablet Take 1 tablet (250 mg total) by mouth daily. Take first 2 tablets together, then 1 every day until finished. 04/09/17   Marlon Pel, PA-C  doxycycline (VIBRA-TABS) 100 MG tablet Take 1 tablet (100 mg total) by mouth every 12 (twelve) hours. Patient not taking: Reported on 09/04/2015 06/20/15   Jana Half, MD  ketorolac (TORADOL) 10 MG tablet Take 1 tablet (10 mg total) by mouth every 6 (six) hours as needed. Patient not taking: Reported on 09/04/2015 04/15/14   Kathlen Mody, MD  lisinopril (PRINIVIL,ZESTRIL) 20 MG tablet Take 1 tablet (20 mg total) by mouth daily. 04/09/17   Marlon Pel, PA-C  meloxicam (MOBIC) 15 MG tablet Take 0.5 tablets (7.5 mg total) by mouth daily. 02/15/16   Anson [place], PA-C  metFORMIN (GLUCOPHAGE) 500 MG tablet Take 1 tablet (500 mg total) by mouth daily with breakfast. 04/09/17   Marlon Pel, PA-C  traMADol (ULTRAM) 50 MG tablet Take 1 tablet (50 mg total) by mouth every 12 (twelve) hours as needed. 06/20/15   Jana Half, MD    Family History Family History  Problem Relation Age of Onset  . Diabetes Mother   . Alcohol abuse Father   .  Hypertension Brother   . Asthma Sister     Social History Social History  Substance Use Topics  . Smoking status: Never Smoker  . Smokeless tobacco: Never Used  . Alcohol use Yes     Allergies   Oxycodone   Review of Systems Review of Systems  Negative ROS aside from pertinent positives and negatives as listed in HPI  Physical Exam Updated Vital Signs BP (!) 149/100 (BP Location: Left Arm)   Pulse 83   Temp 98.6 F (37 C) (Oral)   Resp 16   SpO2 99%   Physical Exam  Constitutional: He is oriented to person, place, and time. He appears well-developed and well-nourished. No distress.  HENT:  Head:  Normocephalic and atraumatic.  Right Ear: Tympanic membrane and ear canal normal.  Left Ear: Tympanic membrane and ear canal normal.  Nose: Nose normal.  Mouth/Throat: Uvula is midline, oropharynx is clear and moist and mucous membranes are normal.  Eyes: Pupils are equal, round, and reactive to light. EOM are normal.  Neck: Normal range of motion. Neck supple.  Cardiovascular: Normal rate and regular rhythm.   Pulmonary/Chest: Effort normal and breath sounds normal.  Coughing during exam. + Inspiratory and expiratory wheezing.  Abdominal: Soft.  No signs of abdominal distention  Musculoskeletal: Normal range of motion.  No LE swelling  Neurological: He is alert and oriented to person, place, and time.  Acting at baseline  Skin: Skin is warm and dry. No rash noted.  Nursing note and vitals reviewed.    ED Treatments / Results  Labs (all labs ordered are listed, but only abnormal results are displayed) Labs Reviewed - No data to display  EKG  EKG Interpretation None       Radiology Dg Chest 2 View  Result Date: 04/09/2017 CLINICAL DATA:  56 year old male with cough and wheezing for several days. EXAM: CHEST  2 VIEW COMPARISON:  01/09/2015 and earlier. FINDINGS: Large body habitus. Stable lung volumes. Normal cardiac size and mediastinal contours. Visualized tracheal air column is within normal limits. No pneumothorax, pulmonary edema, pleural effusion or confluent pulmonary opacity. Flowing endplate osteophytes in the lower thoracic spine. No acute osseous abnormality identified. IMPRESSION: No acute cardiopulmonary abnormality. Electronically Signed   By: Odessa Fleming M.D.   On: 04/09/2017 13:44    Procedures Procedures (including critical care time)  Medications Ordered in ED Medications  albuterol (PROVENTIL) (2.5 MG/3ML) 0.083% nebulizer solution 5 mg (not administered)  ipratropium (ATROVENT) nebulizer solution 0.5 mg (not administered)  albuterol (PROVENTIL HFA;VENTOLIN  HFA) 108 (90 Base) MCG/ACT inhaler 2 puff (not administered)     Initial Impression / Assessment and Plan / ED Course  I have reviewed the triage vital signs and the nursing notes.  Pertinent labs & imaging results that were available during my care of the patient were reviewed by me and considered in my medical decision making (see chart for details).   Albuterol and Atrovent nebulizer, will send home with HFA albuterol. He has no LE swelling. No chest pain, his xray does not show any signs of fluid overload. Pulse, respirations and oxygen saturations are wnl. He has been out of home meds for 1 week and needs refills. (metformin, lisinopril and norvasc)  Final Clinical Impressions(s) / ED Diagnoses   Final diagnoses:  Hypertension, unspecified type  Prediabetes  Bronchitis    New Prescriptions New Prescriptions   AZITHROMYCIN (ZITHROMAX) 250 MG TABLET    Take 1 tablet (250 mg total) by mouth daily. Take  first 2 tablets together, then 1 every day until finished.     Marlon Pel, PA-C 04/09/17 1500    Arby Barrette, MD 04/10/17 1335

## 2017-05-13 ENCOUNTER — Encounter (HOSPITAL_COMMUNITY): Payer: Self-pay

## 2017-05-13 ENCOUNTER — Emergency Department (HOSPITAL_COMMUNITY)
Admission: EM | Admit: 2017-05-13 | Discharge: 2017-05-13 | Disposition: A | Discharge status: Home / Self Care | Payer: BLUE CROSS/BLUE SHIELD | Attending: Emergency Medicine | Admitting: Emergency Medicine

## 2017-05-13 DIAGNOSIS — Z79899 Other long term (current) drug therapy: Secondary | ICD-10-CM | POA: Insufficient documentation

## 2017-05-13 DIAGNOSIS — I10 Essential (primary) hypertension: Secondary | ICD-10-CM | POA: Diagnosis not present

## 2017-05-13 DIAGNOSIS — E119 Type 2 diabetes mellitus without complications: Secondary | ICD-10-CM | POA: Diagnosis not present

## 2017-05-13 DIAGNOSIS — R21 Rash and other nonspecific skin eruption: Secondary | ICD-10-CM | POA: Diagnosis not present

## 2017-05-13 DIAGNOSIS — Z7984 Long term (current) use of oral hypoglycemic drugs: Secondary | ICD-10-CM | POA: Insufficient documentation

## 2017-05-13 DIAGNOSIS — L732 Hidradenitis suppurativa: Secondary | ICD-10-CM | POA: Diagnosis not present

## 2017-05-13 MED ORDER — DOXYCYCLINE HYCLATE 100 MG PO CAPS: 100.0000 mg | ORAL_CAPSULE | Freq: Two times a day (BID) | ORAL | 0 refills | Status: DC

## 2017-05-13 NOTE — ED Triage Notes (Signed)
Pt reports abscess that started under right arm and had now started under left underarm as well. PT reports every time he scratches it there is fluid and blood present. Denies fevers/chills

## 2017-05-13 NOTE — ED Provider Notes (Signed)
[facility] EMERGENCY DEPARTMENT Provider Note   CSN: [id number] Arrival date & time: 05/13/17  1736     History   Chief Complaint Chief Complaint  Patient presents with  . Abscess    HPI [name] is a 56 y.o. male.  HPI    56 year old male presents today with complaints of abscess.  Patient notes approximately 1 week ago he used deodorant in his armpits which he usually does not use.  He notes several small bumps that came up thereafter.  He notes he is developing these in his left armpit as well.  Patient has a history of the same in his lower extremities requiring hospital admission and IV antibiotics.  Patient denies any history of diabetes, denies any fever chills nausea or vomiting.  He denies any fluctuance in this area, but does note discharge.  Patient reports when he was admitted for IV antibiotics they tested him for MRSA and he was negative.  Past Medical History:  Diagnosis Date  . Bronchitis   . Cellulitis and abscess of lower extremity 04/11/2015  . Diabetes mellitus without complication (HCC)   . Hypertension   . Obesity   . Skin infection     Patient Active Problem List   Diagnosis Date Noted  . Scrotal abscess 06/19/2015  . Cellulitis, scrotum 06/19/2015  . Perineal abscess 06/19/2015  . Cellulitis and abscess of leg 04/13/2015  . Prediabetes 04/23/2014  . Morbid obesity with BMI of 50.0-59.9, adult (HCC) 04/12/2014  . Cellulitis 04/09/2014  . Cellulitis of left leg 04/09/2014  . Sepsis (HCC) 04/09/2014  . Hypertension   . Obesity   . Bronchitis     Past Surgical History:  Procedure Laterality Date  . INCISION AND DRAINAGE ABSCESS N/A 06/19/2015   Procedure: INCISION AND DRAINAGE SCROTAL ABSCESS;  Surgeon: Sebastian Ache, MD;  Location: [facility] OR;  Service: Urology;  Laterality: N/A;       Home Medications    Prior to Admission medications   Medication Sig Start Date End Date Taking? Authorizing Provider  amLODipine  (NORVASC) 5 MG tablet Take 1 tablet (5 mg total) by mouth daily. Patient taking differently: Take 5 mg by mouth at bedtime.  04/09/17  Yes Neva Seat, Tiffany, PA-C  lisinopril (PRINIVIL,ZESTRIL) 20 MG tablet Take 1 tablet (20 mg total) by mouth daily. Patient taking differently: Take 20 mg by mouth at bedtime.  04/09/17  Yes Neva Seat, Tiffany, PA-C  metFORMIN (GLUCOPHAGE) 500 MG tablet Take 1 tablet (500 mg total) by mouth daily with breakfast. Patient taking differently: Take 500 mg by mouth at bedtime.  04/09/17  Yes Neva Seat, Tiffany, PA-C  azithromycin (ZITHROMAX) 250 MG tablet Take 1 tablet (250 mg total) by mouth daily. Take first 2 tablets together, then 1 every day until finished. Patient not taking: Reported on 05/13/2017 04/09/17   Marlon Pel, PA-C  doxycycline (VIBRAMYCIN) 100 MG capsule Take 1 capsule (100 mg total) by mouth 2 (two) times daily. 05/13/17   Sairah Knobloch, Tinnie Gens, PA-C  ketorolac (TORADOL) 10 MG tablet Take 1 tablet (10 mg total) by mouth every 6 (six) hours as needed. Patient not taking: Reported on 09/04/2015 04/15/14   Kathlen Mody, MD  meloxicam (MOBIC) 15 MG tablet Take 0.5 tablets (7.5 mg total) by mouth daily. Patient not taking: Reported on 05/13/2017 02/15/16   Anson [place], PA-C  traMADol (ULTRAM) 50 MG tablet Take 1 tablet (50 mg total) by mouth every 12 (twelve) hours as needed. Patient not taking: Reported on 05/13/2017  06/20/15   Jana Halfaylor, Nicholas A, MD    Family History Family History  Problem Relation Age of Onset  . Diabetes Mother   . Alcohol abuse Father   . Hypertension Brother   . Asthma Sister     Social History Social History  Substance Use Topics  . Smoking status: Never Smoker  . Smokeless tobacco: Never Used  . Alcohol use Yes     Allergies   Oxycodone   Review of Systems Review of Systems  All other systems reviewed and are negative.    Physical Exam Updated Vital Signs BP (!) 148/85 (BP Location: Left Arm)   Pulse (!) 101    Temp 98.5 F (36.9 C) (Oral)   Resp 16   Wt (!) 165.6 kg (365 lb)   SpO2 97%   BMI 52.37 kg/m   Physical Exam  Constitutional: He is oriented to person, place, and time. He appears well-developed and well-nourished.  HENT:  Head: Normocephalic and atraumatic.  Eyes: Pupils are equal, round, and reactive to light. Conjunctivae are normal. Right eye exhibits no discharge. Left eye exhibits no discharge. No scleral icterus.  Neck: Normal range of motion. No JVD present. No tracheal deviation present.  Pulmonary/Chest: Effort normal. No stridor.  Musculoskeletal:  Numerous pustules with discharge to the bilateral axilla, surrounding induration noted  Neurological: He is alert and oriented to person, place, and time. Coordination normal.  Psychiatric: He has a normal mood and affect. His behavior is normal. Judgment and thought content normal.  Nursing note and vitals reviewed.    ED Treatments / Results  Labs (all labs ordered are listed, but only abnormal results are displayed) Labs Reviewed - No data to display  EKG  EKG Interpretation None       Radiology No results found.  Procedures Procedures (including critical care time)  Medications Ordered in ED Medications - No data to display   Initial Impression / Assessment and Plan / ED Course  I have reviewed the triage vital signs and the nursing notes.  Pertinent labs & imaging results that were available during my care of the patient were reviewed by me and considered in my medical decision making (see chart for details).      Final Clinical Impressions(s) / ED Diagnoses   Final diagnoses:  Hydradenitis    56 year old male presents today with likely hidradenitis.  He has a history of the same.  I discussed that patient would need to follow-up as an outpatient to be managed appropriately.  He has no immediate indication for I&D here, he will be started on doxycycline.  No signs of systemic illness.  Patient  given strict return precautions follow-up information.  He verbalized understanding and agreement to today's plan had no further questions or concerns at the time discharge.    New Prescriptions Discharge Medication List as of 05/13/2017  8:27 PM    START taking these medications   Details  doxycycline (VIBRAMYCIN) 100 MG capsule Take 1 capsule (100 mg total) by mouth 2 (two) times daily., Starting Thu 05/13/2017, Print         Breydon Senters, [place], PA-C 05/13/17 2101    Derwood KaplanNanavati, Ankit, MD 05/15/17 [phone]

## 2017-05-13 NOTE — ED Notes (Signed)
Called 2x, no response  

## 2017-05-13 NOTE — Discharge Instructions (Signed)
Please read attached information. If you experience any new or worsening signs or symptoms please return to the emergency room for evaluation. Please follow-up with your primary care provider or specialist as discussed. Please use medication prescribed only as directed and discontinue taking if you have any concerning signs or symptoms.   °

## 2017-05-13 NOTE — ED Notes (Signed)
No response in lobby when called to room.

## 2017-05-23 ENCOUNTER — Ambulatory Visit
Admission: EM | Admit: 2017-05-23 | Discharge: 2017-05-23 | Disposition: A | Discharge status: Home / Self Care | Payer: BLUE CROSS/BLUE SHIELD | Attending: Family Medicine | Admitting: Family Medicine

## 2017-05-23 DIAGNOSIS — M170 Bilateral primary osteoarthritis of knee: Secondary | ICD-10-CM | POA: Diagnosis not present

## 2017-05-23 DIAGNOSIS — S39012A Strain of muscle, fascia and tendon of lower back, initial encounter: Secondary | ICD-10-CM

## 2017-05-23 MED ORDER — METHYLPREDNISOLONE SODIUM SUCC 40 MG IJ SOLR
40.0000 mg | Freq: Once | INTRAMUSCULAR | Status: AC
  Administered 2017-05-23: 40 mg via INTRAMUSCULAR

## 2017-05-23 MED ORDER — TRIAMCINOLONE ACETONIDE 40 MG/ML IJ SUSP: 40.0000 mg | Freq: Once | INTRAMUSCULAR | Status: DC

## 2017-05-23 MED ORDER — LIDOCAINE HCL (PF) 1 % IJ SOLN
5.0000 mL | Freq: Once | INTRAMUSCULAR | Status: AC
  Administered 2017-05-23: 5 mL

## 2017-05-23 MED ORDER — CYCLOBENZAPRINE HCL 5 MG PO TABS: 5.0000 mg | ORAL_TABLET | Freq: Three times a day (TID) | ORAL | 0 refills | Status: DC | PRN

## 2017-05-23 NOTE — ED Triage Notes (Signed)
Patient c/o bilateral knee pain for years and would an injection. Patient also c/o lower back pain x 2 days, he was lifting about 50 lbs off the floor and felt the pain in his back after.

## 2017-05-23 NOTE — ED Provider Notes (Signed)
[facility]    CSN: [id number] Arrival date & time: 05/23/17  1309     History   Chief Complaint Chief Complaint  Patient presents with  . Knee Pain  . Back Pain    HPI [name] is a 56 y.o. male presents today for evaluation of bilateral knee pain and lower back pain.  He said several day history of bilateral knee pain, has a history of bilateral knee osteoarthritis that responded well to cortisone injections one year ago here at the urgent care facility.  He is requesting cortisone injections today.  He has tried over-the-counter anti-inflammatory medications with no improvement.  He denies any hip pain, trauma or injury.  No warmth redness or fevers.  He also is complaining of right lower back pain with muscle spasms with no numbness tingling or radicular symptoms.  No trauma or injury to his back.  Pain is 8 out of 10 in both knees and his back.  He is ambulatory with no assist devices.  HPI  Past Medical History:  Diagnosis Date  . Bronchitis   . Cellulitis and abscess of lower extremity 04/11/2015  . Diabetes mellitus without complication (HCC)   . Hypertension   . Obesity   . Skin infection     Patient Active Problem List   Diagnosis Date Noted  . Scrotal abscess 06/19/2015  . Cellulitis, scrotum 06/19/2015  . Perineal abscess 06/19/2015  . Cellulitis and abscess of leg 04/13/2015  . Prediabetes 04/23/2014  . Morbid obesity with BMI of 50.0-59.9, adult (HCC) 04/12/2014  . Cellulitis 04/09/2014  . Cellulitis of left leg 04/09/2014  . Sepsis (HCC) 04/09/2014  . Hypertension   . Obesity   . Bronchitis     History reviewed. No pertinent surgical history.     Home Medications    Prior to Admission medications   Medication Sig Start Date End Date Taking? Authorizing Provider  amLODipine (NORVASC) 5 MG tablet Take 1 tablet (5 mg total) by mouth daily. Patient taking differently: Take 5 mg by mouth at bedtime.  04/09/17   Marlon PelGreene, Tiffany, PA-C   cyclobenzaprine (FLEXERIL) 5 MG tablet Take 1-2 tablets (5-10 mg total) 3 (three) times daily as needed by mouth for muscle spasms. 05/23/17   Evon SlackGaines, Thomas C, PA-C  lisinopril (PRINIVIL,ZESTRIL) 20 MG tablet Take 1 tablet (20 mg total) by mouth daily. Patient taking differently: Take 20 mg by mouth at bedtime.  04/09/17   Marlon PelGreene, Tiffany, PA-C  metFORMIN (GLUCOPHAGE) 500 MG tablet Take 1 tablet (500 mg total) by mouth daily with breakfast. Patient taking differently: Take 500 mg by mouth at bedtime.  04/09/17   Marlon PelGreene, Tiffany, PA-C    Family History Family History  Problem Relation Age of Onset  . Diabetes Mother   . Alcohol abuse Father   . Hypertension Brother   . Asthma Sister     Social History Social History   Tobacco Use  . Smoking status: Never Smoker  . Smokeless tobacco: Never Used  Substance Use Topics  . Alcohol use: Yes  . Drug use: No     Allergies   Oxycodone   Review of Systems Review of Systems  Constitutional: Negative for fever.  Respiratory: Negative for shortness of breath.   Cardiovascular: Negative for chest pain.  Gastrointestinal: Negative for abdominal pain.  Genitourinary: Negative for difficulty urinating, dysuria and urgency.  Musculoskeletal: Positive for arthralgias, back pain, gait problem and myalgias.  Skin: Negative for rash.  Neurological: Negative for dizziness  and headaches.     Physical Exam Triage Vital Signs ED Triage Vitals  Enc Vitals Group     BP 05/23/17 1324 (!) 142/70     Pulse Rate 05/23/17 1324 95     Resp --      Temp 05/23/17 1324 98.3 F (36.8 C)     Temp Source 05/23/17 1324 Oral     SpO2 05/23/17 1324 97 %     Weight 05/23/17 1327 (!) 365 lb (165.6 kg)     Height 05/23/17 1327 5\' 10"  (1.778 m)     Head Circumference --      Peak Flow --      Pain Score 05/23/17 1328 8     Pain Loc --      Pain Edu? --      Excl. in GC? --    No data found.  Updated Vital Signs BP (!) 142/70 (BP Location: Right  Arm)   Pulse 95   Temp 98.3 F (36.8 C) (Oral)   Ht 5\' 10"  (1.778 m)   Wt (!) 365 lb (165.6 kg)   SpO2 97%   BMI 52.37 kg/m   Visual Acuity Right Eye Distance:   Left Eye Distance:   Bilateral Distance:    Right Eye Near:   Left Eye Near:    Bilateral Near:     Physical Exam  Constitutional: He is oriented to person, place, and time. He appears well-developed and well-nourished.  HENT:  Head: Normocephalic and atraumatic.  Eyes: Conjunctivae are normal.  Neck: Normal range of motion.  Cardiovascular: Normal rate.  Pulmonary/Chest: Effort normal. No respiratory distress.  Musculoskeletal: Normal range of motion.  Bilateral knee tenderness along the medial joint line with normal active range of motion.  No instability on exam.  No warmth swelling erythema or effusion.  Examination of lumbar spine shows no spinous process tenderness but mild right paravertebral muscle tenderness or muscle spasm noted.  Neurological: He is alert and oriented to person, place, and time.  Skin: Skin is warm. No rash noted.  Psychiatric: He has a normal mood and affect. His behavior is normal. Thought content normal.     UC Treatments / Results  Labs (all labs ordered are listed, but only abnormal results are displayed) Labs Reviewed - No data to display  EKG  EKG Interpretation None       Radiology No results found.  Procedures Join Aspiration/Injection Date/Time: 05/23/2017 2:20 PM Performed by: Evon Slack, PA-C Authorized by: Tommie Sams, DO   Consent:    Consent obtained:  Verbal   Consent given by:  Patient   Risks discussed:  Bleeding, infection and pain Location:    Location:  Knee   Knee:  R knee (Bilateral) Anesthesia (see MAR for exact dosages):    Anesthesia method:  None Procedure details:    Needle gauge:  22 G   Ultrasound guidance: no     Approach:  Anterior   Steroid injected: yes     Specimen collected: no   Post-procedure details:    Dressing:   Adhesive bandage   Patient tolerance of procedure:  Tolerated well, no immediate complications   (including critical care time)  Medications Ordered in UC Medications  lidocaine (PF) (XYLOCAINE) 1 % injection 5 mL (not administered)  lidocaine (PF) (XYLOCAINE) 1 % injection 5 mL (not administered)  methylPREDNISolone sodium succinate (SOLU-MEDROL) 40 mg/mL injection 40 mg (not administered)  methylPREDNISolone sodium succinate (SOLU-MEDROL) 40 mg/mL injection 40 mg (  not administered)     Initial Impression / Assessment and Plan / UC Course  I have reviewed the triage vital signs and the nursing notes.  Pertinent labs & imaging results that were available during my care of the patient were reviewed by me and considered in my medical decision making (see chart for details).     56 year old male with bilateral knee osteoarthritis severe degenerative changes along the medial compartment.  He agreed and consented to bilateral knee intra-articular cortisone injections today in the office.  Patient tolerated procedures well.  He will follow-up with orthopedics.  Patient also given prescription for muscle relaxer.  Final Clinical Impressions(s) / UC Diagnoses   Final diagnoses:  Strain of lumbar region, initial encounter  Primary osteoarthritis of both knees    ED Discharge Orders        Ordered    cyclobenzaprine (FLEXERIL) 5 MG tablet  3 times daily PRN     05/23/17 1407         Evon SlackGaines, Thomas C, [place]PA-C 05/23/17 1423

## 2017-05-23 NOTE — Discharge Instructions (Signed)
Please take medication as prescribed for muscle strain.  Rest ice and elevate the knees for 24-48 hours.  Follow-up with orthopedics if no improvement in 3-4 weeks.  Return to the urgent care facility for any worsening symptoms or no changes in her health.

## 2017-06-15 DIAGNOSIS — E669 Obesity, unspecified: Secondary | ICD-10-CM | POA: Diagnosis not present

## 2017-06-15 DIAGNOSIS — M170 Bilateral primary osteoarthritis of knee: Secondary | ICD-10-CM | POA: Diagnosis not present

## 2017-06-15 DIAGNOSIS — Z23 Encounter for immunization: Secondary | ICD-10-CM | POA: Diagnosis not present

## 2017-06-15 DIAGNOSIS — I10 Essential (primary) hypertension: Secondary | ICD-10-CM | POA: Diagnosis not present

## 2017-07-20 DIAGNOSIS — I10 Essential (primary) hypertension: Secondary | ICD-10-CM | POA: Diagnosis not present

## 2017-07-20 DIAGNOSIS — Z6841 Body Mass Index (BMI) 40.0 and over, adult: Secondary | ICD-10-CM | POA: Diagnosis not present

## 2017-07-20 DIAGNOSIS — L732 Hidradenitis suppurativa: Secondary | ICD-10-CM | POA: Diagnosis not present

## 2017-08-03 DIAGNOSIS — E118 Type 2 diabetes mellitus with unspecified complications: Secondary | ICD-10-CM | POA: Diagnosis not present

## 2017-08-03 DIAGNOSIS — R9431 Abnormal electrocardiogram [ECG] [EKG]: Secondary | ICD-10-CM | POA: Diagnosis not present

## 2017-08-03 DIAGNOSIS — M170 Bilateral primary osteoarthritis of knee: Secondary | ICD-10-CM | POA: Diagnosis not present

## 2017-08-03 DIAGNOSIS — I10 Essential (primary) hypertension: Secondary | ICD-10-CM | POA: Diagnosis not present

## 2017-08-09 DIAGNOSIS — Z0189 Encounter for other specified special examinations: Secondary | ICD-10-CM | POA: Diagnosis not present

## 2017-08-20 DIAGNOSIS — I10 Essential (primary) hypertension: Secondary | ICD-10-CM | POA: Diagnosis not present

## 2017-08-20 DIAGNOSIS — R9431 Abnormal electrocardiogram [ECG] [EKG]: Secondary | ICD-10-CM | POA: Diagnosis not present

## 2017-08-27 DIAGNOSIS — I10 Essential (primary) hypertension: Secondary | ICD-10-CM | POA: Diagnosis not present

## 2017-08-31 DIAGNOSIS — E118 Type 2 diabetes mellitus with unspecified complications: Secondary | ICD-10-CM | POA: Diagnosis not present

## 2017-08-31 DIAGNOSIS — R9431 Abnormal electrocardiogram [ECG] [EKG]: Secondary | ICD-10-CM | POA: Diagnosis not present

## 2017-08-31 DIAGNOSIS — I5042 Chronic combined systolic (congestive) and diastolic (congestive) heart failure: Secondary | ICD-10-CM | POA: Diagnosis not present

## 2017-08-31 DIAGNOSIS — I10 Essential (primary) hypertension: Secondary | ICD-10-CM | POA: Diagnosis not present

## 2017-09-14 DIAGNOSIS — R9439 Abnormal result of other cardiovascular function study: Secondary | ICD-10-CM | POA: Diagnosis not present

## 2017-09-14 DIAGNOSIS — E7800 Pure hypercholesterolemia, unspecified: Secondary | ICD-10-CM | POA: Diagnosis not present

## 2017-09-14 DIAGNOSIS — I5042 Chronic combined systolic (congestive) and diastolic (congestive) heart failure: Secondary | ICD-10-CM | POA: Diagnosis not present

## 2017-09-14 DIAGNOSIS — I10 Essential (primary) hypertension: Secondary | ICD-10-CM | POA: Diagnosis not present

## 2017-09-28 DIAGNOSIS — I10 Essential (primary) hypertension: Secondary | ICD-10-CM | POA: Diagnosis not present

## 2017-09-28 DIAGNOSIS — Z0189 Encounter for other specified special examinations: Secondary | ICD-10-CM | POA: Diagnosis not present

## 2017-09-28 DIAGNOSIS — I5042 Chronic combined systolic (congestive) and diastolic (congestive) heart failure: Secondary | ICD-10-CM | POA: Diagnosis not present

## 2017-10-19 DIAGNOSIS — I5042 Chronic combined systolic (congestive) and diastolic (congestive) heart failure: Secondary | ICD-10-CM | POA: Diagnosis not present

## 2017-10-19 DIAGNOSIS — Z0189 Encounter for other specified special examinations: Secondary | ICD-10-CM | POA: Diagnosis not present

## 2017-10-19 DIAGNOSIS — I10 Essential (primary) hypertension: Secondary | ICD-10-CM | POA: Diagnosis not present

## 2018-01-18 DIAGNOSIS — I10 Essential (primary) hypertension: Secondary | ICD-10-CM | POA: Diagnosis not present

## 2018-01-18 DIAGNOSIS — I5042 Chronic combined systolic (congestive) and diastolic (congestive) heart failure: Secondary | ICD-10-CM | POA: Diagnosis not present

## 2018-01-18 DIAGNOSIS — R9431 Abnormal electrocardiogram [ECG] [EKG]: Secondary | ICD-10-CM | POA: Diagnosis not present

## 2018-01-18 DIAGNOSIS — E7800 Pure hypercholesterolemia, unspecified: Secondary | ICD-10-CM | POA: Diagnosis not present

## 2018-02-02 DIAGNOSIS — E119 Type 2 diabetes mellitus without complications: Secondary | ICD-10-CM | POA: Diagnosis not present

## 2018-04-08 DIAGNOSIS — E089 Diabetes mellitus due to underlying condition without complications: Secondary | ICD-10-CM | POA: Diagnosis not present

## 2018-04-08 DIAGNOSIS — R7309 Other abnormal glucose: Secondary | ICD-10-CM | POA: Diagnosis not present

## 2018-04-08 DIAGNOSIS — Z125 Encounter for screening for malignant neoplasm of prostate: Secondary | ICD-10-CM | POA: Diagnosis not present

## 2018-04-08 DIAGNOSIS — R5383 Other fatigue: Secondary | ICD-10-CM | POA: Diagnosis not present

## 2018-04-08 DIAGNOSIS — M170 Bilateral primary osteoarthritis of knee: Secondary | ICD-10-CM | POA: Diagnosis not present

## 2018-04-08 DIAGNOSIS — I10 Essential (primary) hypertension: Secondary | ICD-10-CM | POA: Diagnosis not present

## 2018-04-08 DIAGNOSIS — R351 Nocturia: Secondary | ICD-10-CM | POA: Diagnosis not present

## 2019-02-28 DIAGNOSIS — I10 Essential (primary) hypertension: Secondary | ICD-10-CM | POA: Diagnosis not present

## 2019-02-28 DIAGNOSIS — E1165 Type 2 diabetes mellitus with hyperglycemia: Secondary | ICD-10-CM | POA: Diagnosis not present

## 2019-02-28 DIAGNOSIS — M25569 Pain in unspecified knee: Secondary | ICD-10-CM | POA: Diagnosis not present

## 2019-08-09 ENCOUNTER — Ambulatory Visit: Payer: 59 | Attending: Internal Medicine

## 2019-08-09 DIAGNOSIS — Z20822 Contact with and (suspected) exposure to covid-19: Secondary | ICD-10-CM

## 2019-08-10 LAB — NOVEL CORONAVIRUS, NAA: SARS-CoV-2, NAA: DETECTED — AB

## 2019-08-11 ENCOUNTER — Telehealth: Payer: Self-pay | Admitting: Nurse Practitioner

## 2019-08-11 NOTE — Telephone Encounter (Signed)
Called to Discuss with patient about Covid symptoms and the use of bamlanivimab, a monoclonal antibody infusion for those with mild to moderate Covid symptoms and at a high risk of hospitalization.     Pt is qualified for this infusion at the [facility] infusion center due to co-morbid conditions and/or a member of an at-risk group.     Unable to reach pt  

## 2019-08-11 NOTE — Telephone Encounter (Signed)
Called to discuss with [name] about Covid symptoms and the use of bamlanivimab, a monoclonal antibody infusion for those with mild to moderate Covid symptoms and at a high risk of hospitalization.     Pt is qualified for this infusion at the [facility] infusion center due to co-morbid conditions and/or a member of an at-risk group, however declines infusion at this time. Patient reports only symptom is loss of smell and otherwise feels fine. Patient to call back if he decides that he does want to get infusion. Callback number to the infusion center given. Patient advised to go to Urgent care or ED with severe symptoms. Last date he would be eligible for infusion is 08/17/19.    Patient Active Problem List   Diagnosis Date Noted  . Scrotal abscess 06/19/2015  . Cellulitis, scrotum 06/19/2015  . Perineal abscess 06/19/2015  . Cellulitis and abscess of leg 04/13/2015  . Prediabetes 04/23/2014  . Morbid obesity with BMI of 50.0-59.9, adult (HCC) 04/12/2014  . Cellulitis 04/09/2014  . Cellulitis of left leg 04/09/2014  . Sepsis (HCC) 04/09/2014  . Hypertension   . Obesity   . Bronchitis     Willette Alma, AGPCNP-BC Pager: [phone] Amion: N. Cousar

## 2019-08-17 ENCOUNTER — Ambulatory Visit: Payer: 59 | Attending: Internal Medicine

## 2019-08-17 DIAGNOSIS — Z20822 Contact with and (suspected) exposure to covid-19: Secondary | ICD-10-CM

## 2019-08-18 LAB — NOVEL CORONAVIRUS, NAA: SARS-CoV-2, NAA: DETECTED — AB

## 2021-05-21 ENCOUNTER — Other Ambulatory Visit: Payer: Self-pay

## 2021-05-21 ENCOUNTER — Emergency Department (HOSPITAL_COMMUNITY)
Admission: EM | Admit: 2021-05-21 | Discharge: 2021-05-22 | Disposition: A | Discharge status: Home / Self Care | Payer: 59 | Attending: Emergency Medicine | Admitting: Emergency Medicine

## 2021-05-21 DIAGNOSIS — Z79899 Other long term (current) drug therapy: Secondary | ICD-10-CM | POA: Diagnosis not present

## 2021-05-21 DIAGNOSIS — Z7984 Long term (current) use of oral hypoglycemic drugs: Secondary | ICD-10-CM | POA: Diagnosis not present

## 2021-05-21 DIAGNOSIS — J101 Influenza due to other identified influenza virus with other respiratory manifestations: Secondary | ICD-10-CM | POA: Insufficient documentation

## 2021-05-21 DIAGNOSIS — R059 Cough, unspecified: Secondary | ICD-10-CM | POA: Diagnosis present

## 2021-05-21 DIAGNOSIS — I10 Essential (primary) hypertension: Secondary | ICD-10-CM | POA: Diagnosis not present

## 2021-05-21 DIAGNOSIS — Z20822 Contact with and (suspected) exposure to covid-19: Secondary | ICD-10-CM | POA: Insufficient documentation

## 2021-05-21 DIAGNOSIS — J111 Influenza due to unidentified influenza virus with other respiratory manifestations: Secondary | ICD-10-CM

## 2021-05-21 DIAGNOSIS — E119 Type 2 diabetes mellitus without complications: Secondary | ICD-10-CM | POA: Insufficient documentation

## 2021-05-21 NOTE — ED Triage Notes (Signed)
Pt has had cough for 5 days, worse the past 3.  Wife and child diagnosed with pneumonia today.  Pt states he usually gets bronchitis but this is different.

## 2021-05-22 ENCOUNTER — Emergency Department (HOSPITAL_COMMUNITY): Payer: 59

## 2021-05-22 LAB — RESP PANEL BY RT-PCR (FLU A&B, COVID) ARPGX2
Influenza A by PCR: POSITIVE — AB
Influenza B by PCR: NEGATIVE
SARS Coronavirus 2 by RT PCR: NEGATIVE

## 2021-05-22 MED ORDER — FLUTICASONE PROPIONATE 50 MCG/ACT NA SUSP: 2.0000 | Freq: Every day | NASAL | 0 refills | Status: DC

## 2021-05-22 MED ORDER — BENZONATATE 100 MG PO CAPS: 100.0000 mg | ORAL_CAPSULE | Freq: Three times a day (TID) | ORAL | 0 refills | Status: DC

## 2021-05-22 MED ORDER — ALBUTEROL SULFATE HFA 108 (90 BASE) MCG/ACT IN AERS
4.0000 | INHALATION_SPRAY | Freq: Once | RESPIRATORY_TRACT | Status: AC
  Administered 2021-05-22: 4 via RESPIRATORY_TRACT
  Filled 2021-05-22: qty 6.7

## 2021-05-22 NOTE — ED Provider Notes (Signed)
[facility] EMERGENCY DEPARTMENT Provider Note   CSN: [id number] Arrival date & time: 05/21/21  2124    History Chief Complaint  Patient presents with   Cough    [name] is a 60 y.o. male with past medical history significant for DM, hypertension who comes in for evaluation of cough. Began 5 days ago.  Nonproductive.  Cough is better yesterday.  He is able to go to work without difficulty.  States he wanted to come here to "just be evaluated."  No fever, headache, chest pain, shortness breath abdominal pain, diarrhea, dysuria.  Not taking anything at home.  Denies additional aggravating or alleviating factors.  He is questioning whether he has bronchitis which he has had previously.  He uses family's nebulizer which helps.  History obtained from patient and past medical records.  No interpreter is used.  HPI     Past Medical History:  Diagnosis Date   Bronchitis    Cellulitis and abscess of lower extremity 04/11/2015   Diabetes mellitus without complication (HCC)    Hypertension    Obesity    Skin infection     Patient Active Problem List   Diagnosis Date Noted   Scrotal abscess 06/19/2015   Cellulitis, scrotum 06/19/2015   Perineal abscess 06/19/2015   Cellulitis and abscess of leg 04/13/2015   Prediabetes 04/23/2014   Morbid obesity with BMI of 50.0-59.9, adult (HCC) 04/12/2014   Cellulitis 04/09/2014   Cellulitis of left leg 04/09/2014   Sepsis (HCC) 04/09/2014   Hypertension    Obesity    Bronchitis     Past Surgical History:  Procedure Laterality Date   INCISION AND DRAINAGE ABSCESS N/A 06/19/2015   Procedure: INCISION AND DRAINAGE SCROTAL ABSCESS;  Surgeon: Sebastian Ache, MD;  Location: [facility] OR;  Service: Urology;  Laterality: N/A;       Family History  Problem Relation Age of Onset   Diabetes Mother    Alcohol abuse Father    Hypertension Brother    Asthma Sister     Social History   Tobacco Use   Smoking status: Never    Smokeless tobacco: Never  Substance Use Topics   Alcohol use: Yes   Drug use: No    Home Medications Prior to Admission medications   Medication Sig Start Date End Date Taking? Authorizing Provider  benzonatate (TESSALON) 100 MG capsule Take 1 capsule (100 mg total) by mouth every 8 (eight) hours. 05/22/21  Yes Neetu Carrozza A, PA-C  amLODipine (NORVASC) 5 MG tablet Take 1 tablet (5 mg total) by mouth daily. Patient taking differently: Take 5 mg by mouth at bedtime.  04/09/17   Marlon Pel, PA-C  cyclobenzaprine (FLEXERIL) 5 MG tablet Take 1-2 tablets (5-10 mg total) 3 (three) times daily as needed by mouth for muscle spasms. 05/23/17   Evon Slack, PA-C  fluticasone (FLONASE) 50 MCG/ACT nasal spray Place 2 sprays into both nostrils daily. 05/22/21   Betsey Sossamon A, PA-C  lisinopril (PRINIVIL,ZESTRIL) 20 MG tablet Take 1 tablet (20 mg total) by mouth daily. Patient taking differently: Take 20 mg by mouth at bedtime.  04/09/17   Marlon Pel, PA-C  metFORMIN (GLUCOPHAGE) 500 MG tablet Take 1 tablet (500 mg total) by mouth daily with breakfast. Patient taking differently: Take 500 mg by mouth at bedtime.  04/09/17   Marlon Pel, PA-C    Allergies    Oxycodone  Review of Systems   Review of Systems  Constitutional: Negative.   HENT:  Negative.    Respiratory:  Positive for cough. Negative for choking, chest tightness, shortness of breath, wheezing and stridor.   Cardiovascular: Negative.   Gastrointestinal: Negative.   Genitourinary: Negative.   Musculoskeletal: Negative.   Skin: Negative.   Neurological: Negative.   All other systems reviewed and are negative.  Physical Exam Updated Vital Signs BP 100/65   Pulse 71   Temp 98 F (36.7 C) (Oral)   Resp 17   SpO2 96%   Physical Exam Vitals and nursing note reviewed.  Constitutional:      General: He is not in acute distress.    Appearance: He is well-developed. He is not ill-appearing, toxic-appearing or  diaphoretic.  HENT:     Head: Normocephalic and atraumatic.     Nose: Nose normal.     Mouth/Throat:     Mouth: Mucous membranes are moist.  Eyes:     Pupils: Pupils are equal, round, and reactive to light.  Cardiovascular:     Rate and Rhythm: Normal rate and regular rhythm.     Pulses: Normal pulses.          Radial pulses are 2+ on the right side and 2+ on the left side.     Heart sounds: Normal heart sounds.  Pulmonary:     Effort: Pulmonary effort is normal. No respiratory distress.     Breath sounds: Wheezing present.     Comments: Minimal expiratory wheeze.  Speaks in full sentences without difficulty. Abdominal:     General: Bowel sounds are normal. There is no distension.     Palpations: Abdomen is soft.  Musculoskeletal:        General: No swelling, tenderness, deformity or signs of injury. Normal range of motion.     Cervical back: Normal range of motion and neck supple.  Skin:    General: Skin is warm and dry.     Capillary Refill: Capillary refill takes less than 2 seconds.  Neurological:     General: No focal deficit present.     Mental Status: He is alert and oriented to person, place, and time.    ED Results / Procedures / Treatments   Labs (all labs ordered are listed, but only abnormal results are displayed) Labs Reviewed  RESP PANEL BY RT-PCR (FLU A&B, COVID) ARPGX2 - Abnormal; Notable for the following components:      Result Value   Influenza A by PCR POSITIVE (*)    All other components within normal limits    EKG None  Radiology DG Chest 2 View  Result Date: 05/22/2021 CLINICAL DATA:  Cough EXAM: CHEST - 2 VIEW COMPARISON:  04/09/2017 FINDINGS: Lungs are clear.  No pleural effusion or pneumothorax. The heart is normal in size. Degenerative changes of the visualized thoracolumbar spine. IMPRESSION: Normal chest radiographs. Electronically Signed   By: Charline Bills M.D.   On: 05/22/2021 00:36    Procedures Procedures   Medications Ordered  in ED Medications  albuterol (VENTOLIN HFA) 108 (90 Base) MCG/ACT inhaler 4 puff (has no administration in time range)   ED Course  I have reviewed the triage vital signs and the nursing notes.  Pertinent labs & imaging results that were available during my care of the patient were reviewed by me and considered in my medical decision making (see chart for details).  Here for evaluation of cough.  He is afebrile, nonseptic, not ill-appearing.  He has no hypoxia, tachycardia or tachypnea.  Work-up started from triage which I  personally reviewed and interpreted.  Chest x-ray without any pneumonia, cardiomegaly, pulm edema or infiltrates.  Influenza A test positive.  He is ambulatory without any hypoxia.  Does have very minimal expiratory wheeze on exam.  Will be given albuterol inhaler.  He is outside treatment window for Tamiflu.  Discussed symptomatic management.  He denies any chest pain, shortness of breath is tolerating p.o. intake.  Discussed symptomatic management which she is agreeable.  Close follow-up with PCP. He is agreeable  The patient has been appropriately medically screened and/or stabilized in the ED. I have low suspicion for any other emergent medical condition which would require further screening, evaluation or treatment in the ED or require inpatient management.  Patient is hemodynamically stable and in no acute distress.  Patient able to ambulate in department prior to ED.  Evaluation does not show acute pathology that would require ongoing or additional emergent interventions while in the emergency department or further inpatient treatment.  I have discussed the diagnosis with the patient and answered all questions.  Pain is been managed while in the emergency department and patient has no further complaints prior to discharge.  Patient is comfortable with plan discussed in room and is stable for discharge at this time.  I have discussed strict return precautions for returning to  the emergency department.  Patient was encouraged to follow-up with PCP/specialist refer to at discharge.     MDM Rules/Calculators/A&P                            Final Clinical Impression(s) / ED Diagnoses Final diagnoses:  Influenza    Rx / DC Orders ED Discharge Orders          Ordered    benzonatate (TESSALON) 100 MG capsule  Every 8 hours        05/22/21 0809    fluticasone (FLONASE) 50 MCG/ACT nasal spray  Daily,   Status:  Discontinued        05/22/21 0809    fluticasone (FLONASE) 50 MCG/ACT nasal spray  Daily        05/22/21 0812             Norissa Bartee A, PA-C 05/22/21 0823    Horton, Clabe Seal, DO 05/22/21 1054

## 2021-05-22 NOTE — Discharge Instructions (Addendum)
Flu test positive  Tessalon pearls for cough  Flonase for runny nose  Follow up with PCP in 2-3 days for re-evaluation  Return for new or worsening symptoms

## 2021-05-22 NOTE — ED Provider Notes (Signed)
MSE was initiated and I personally evaluated the patient and placed orders (if any) at  12:04 AM on May 22, 2021.  Patient with 5 days of productive cough, like his annual bronchitis, but not going away with usual treatments. No fever. Nonsmoker. Using family members nebulizer with relief.   Today's Vitals   05/21/21 2144 05/21/21 2348  BP: (!) 178/94   Pulse: 90   Resp: 20   Temp: 99.6 F (37.6 C)   TempSrc: Oral   SpO2: 91%   PainSc:  0-No pain   There is no height or weight on file to calculate BMI.  Insp/exp wheezing with rhonchi bilaterally O2 sat 91% RRR  CXR, COVID/flu, Albuterol  The patient appears stable so that the remainder of the MSE may be completed by another provider.   Elpidio Anis, PA-C 05/22/21 0006    Linwood Dibbles, MD 05/26/21 1023

## 2023-04-27 ENCOUNTER — Encounter: Payer: 59 | Admitting: Nurse Practitioner

## 2023-05-03 DIAGNOSIS — Z0289 Encounter for other administrative examinations: Secondary | ICD-10-CM

## 2023-05-04 ENCOUNTER — Encounter: Payer: Self-pay | Admitting: Bariatrics

## 2023-05-04 ENCOUNTER — Ambulatory Visit (INDEPENDENT_AMBULATORY_CARE_PROVIDER_SITE_OTHER): Payer: BC Managed Care – PPO | Admitting: Bariatrics

## 2023-05-04 VITALS — BP 150/90 | HR 85 | Temp 97.3°F | Ht 67.5 in | Wt 358.0 lb

## 2023-05-04 DIAGNOSIS — R7303 Prediabetes: Secondary | ICD-10-CM

## 2023-05-04 DIAGNOSIS — Z6841 Body Mass Index (BMI) 40.0 and over, adult: Secondary | ICD-10-CM | POA: Diagnosis not present

## 2023-05-04 DIAGNOSIS — E66813 Obesity, class 3: Secondary | ICD-10-CM

## 2023-05-04 DIAGNOSIS — I10 Essential (primary) hypertension: Secondary | ICD-10-CM | POA: Diagnosis not present

## 2023-05-04 NOTE — Progress Notes (Signed)
Office: [phone]  /  Fax: [phone]   Initial Visit  [name] was seen in clinic today to evaluate for obesity. He is interested in losing weight to improve overall health and reduce the risk of weight related complications. He presents today to review program treatment options, initial physical assessment, and evaluation.     He was referred by: PCP  When asked what else they would like to accomplish? He states: Adopt healthier eating patterns, Improve energy levels and physical activity, Improve existing medical conditions, Reduce risk for a surgery, and Improve quality of life  When asked how has your weight affected you? He states: Contributed to medical problems, Contributed to orthopedic problems or mobility issues, Having fatigue, and Having poor endurance  Some associated conditions: Hypertension, Arthritis:kness, and Prediabetes  Contributing factors: Family history, Nutritional, and Reduced physical activity  Weight promoting medications identified: None  Current nutrition plan: Portion control / smart choices  Current level of physical activity: None and Limited due to chronic pain or orthopedic problems  Current or previous pharmacotherapy: GLP-1  Response to medication: Was cost prohibitive or lost coverage for AOM.   Past medical history includes:   Past Medical History:  Diagnosis Date   Bronchitis    Cellulitis and abscess of lower extremity 04/11/2015   Diabetes mellitus without complication (HCC)    Hypertension    Obesity    Skin infection      Objective:   BP (!) 165/119   Pulse 85   Temp (!) 97.3 F (36.3 C)   Ht 5' 7.5" (1.715 m)   Wt (!) 358 lb (162.4 kg)   SpO2 98%   BMI 55.24 kg/m  He was weighed on the bioimpedance scale: Body mass index is 55.24 kg/m.  Peak Weight:540 lbs , Body Fat%:49.1 % , Visceral Fat Rating:40, Weight trend over the last 12 months: Unchanged  General:  Alert, oriented and cooperative. Patient is in no  acute distress.  Respiratory: Normal respiratory effort, no problems with respiration noted  Extremities: Normal range of motion.    Mental Status: Normal mood and affect. Normal behavior. Normal judgment and thought content.   DIAGNOSTIC DATA REVIEWED:  BMET    Component Value Date/Time   NA 137 06/18/2015 1930   K 4.0 06/18/2015 1930   CL 100 (L) 06/18/2015 1930   CO2 28 06/18/2015 1930   GLUCOSE 120 (H) 06/18/2015 1930   BUN 8 06/18/2015 1930   CREATININE 0.82 06/18/2015 1930   CALCIUM 9.6 06/18/2015 1930   GFRNONAA >60 06/18/2015 1930   GFRAA >60 06/18/2015 1930   Lab Results  Component Value Date   HGBA1C 5.4 09/04/2015   HGBA1C 6.0 (H) 04/14/2014   No results found for: "INSULIN" CBC    Component Value Date/Time   WBC 10.4 06/18/2015 1930   RBC 4.39 06/18/2015 1930   HGB 13.0 06/18/2015 1930   HCT 39.3 06/18/2015 1930   PLT 157 06/18/2015 1930   MCV 89.5 06/18/2015 1930   MCH 29.6 06/18/2015 1930   MCHC 33.1 06/18/2015 1930   RDW 13.7 06/18/2015 1930   Iron/TIBC/Ferritin/ %Sat No results found for: "IRON", "TIBC", "FERRITIN", "IRONPCTSAT" Lipid Panel     Component Value Date/Time   CHOL 187 04/14/2015 0439   TRIG 151 (H) 04/14/2015 0439   HDL 29 (L) 04/14/2015 0439   CHOLHDL 6.4 04/14/2015 0439   VLDL 30 04/14/2015 0439   LDLCALC 128 (H) 04/14/2015 0439   Hepatic Function Panel  Component Value Date/Time   PROT 6.9 04/13/2015 1254   ALBUMIN 2.9 (L) 04/13/2015 1254   AST 33 04/13/2015 1254   ALT 28 04/13/2015 1254   ALKPHOS 58 04/13/2015 1254   BILITOT 0.6 04/13/2015 1254     Assessment and Plan:   Hypertension He has not taken his medications for 4 days, but did take this am. He has medications.  Hypertension control uncertain, needs further observation, needs improvement, patient poorly compliant, and needs to follow diet more regularly.  Medication(s): Amlodipine 5 mg 1 daily  and Lisinopril 20 mg  BP Readings from Last 3 Encounters:   05/04/23 (!) 150/90  05/22/21 98/65  05/23/17 (!) 142/70   Lab Results  Component Value Date   CREATININE 0.82 06/18/2015   CREATININE 0.75 04/15/2015   CREATININE 0.71 04/14/2015   No results found for: "GFR"  Plan: Continue all antihypertensives at current dosages.Marland Kitchen He will take his medications on a regular basis.  No added salt. Will keep sodium content to 1,500 mg or less per day.    Prediabetes Last A1c was 5.4  Medication(s): Metformin  Metformin 500 mg once daily breakfast Lab Results  Component Value Date   HGBA1C 5.4 09/04/2015   HGBA1C 6.2 (H) 04/13/2015   HGBA1C 6.0 (H) 04/14/2014   No results found for: "INSULIN"  Plan: Will minimize all refined carbohydrates both sweets and starches.  Will work on the plan and exercise.  Consider both aerobic and resistance training.  Will keep protein, water, and fiber intake high.  Will continue medications.    Morbid Obesity: Current BMI 55.4    Obesity Treatment / Action Plan:  Patient will work on garnering support from family and friends to begin weight loss journey. Will work on eliminating or reducing the presence of highly palatable, calorie dense foods in the home. Will complete provided nutritional and psychosocial assessment questionnaire before the next appointment. Will be scheduled for indirect calorimetry to determine resting energy expenditure in a fasting state.  This will allow [place] to create a reduced calorie, high-protein meal plan to promote loss of fat mass while preserving muscle mass. Will work on managing stress via relaxation methods as this may result in unhealthy eating patterns. Counseled on the health benefits of losing 5%-15% of total body weight. Was counseled on nutritional approaches to weight loss and benefits of reducing processed foods and consuming plant-based foods and high quality protein as part of nutritional weight management. Was counseled on pharmacotherapy and role as an adjunct  in weight management.   Obesity Education Performed Today:  He was weighed on the bioimpedance scale and results were discussed and documented in the synopsis.  We discussed obesity as a disease and the importance of a more detailed evaluation of all the factors contributing to the disease.  We discussed the importance of long term lifestyle changes which include nutrition, exercise and behavioral modifications as well as the importance of customizing this to his specific health and social needs.  We discussed the benefits of reaching a healthier weight to alleviate the symptoms of existing conditions and reduce the risks of the biomechanical, metabolic and psychological effects of obesity.  Discussed New Patient/Late Arrival, and Cancellation Policies. Patient voiced understanding and allowed to ask questions.   NOHE KUCZMARSKI appears to be in the action stage of change and states they are ready to start intensive lifestyle modifications and behavioral modifications.  30 minutes was spent today on this visit including the above counseling, pre-visit chart  review, and post-visit documentation.  Reviewed by clinician on day of visit: allergies, medications, problem list, medical history, surgical history, family history, social history, and previous encounter notes.    Ramsay Bognar A. Lorretta HarpO.

## 2023-05-06 LAB — COLOGUARD: COLOGUARD: NEGATIVE

## 2023-05-06 LAB — EXTERNAL GENERIC LAB PROCEDURE: COLOGUARD: NEGATIVE

## 2023-05-19 ENCOUNTER — Encounter (INDEPENDENT_AMBULATORY_CARE_PROVIDER_SITE_OTHER): Payer: Self-pay | Admitting: *Deleted

## 2023-05-25 ENCOUNTER — Ambulatory Visit (INDEPENDENT_AMBULATORY_CARE_PROVIDER_SITE_OTHER): Payer: BC Managed Care – PPO | Admitting: Family Medicine

## 2023-05-25 VITALS — BP 165/83 | HR 86 | Temp 97.9°F | Ht 67.5 in | Wt 357.0 lb

## 2023-05-25 DIAGNOSIS — R7303 Prediabetes: Secondary | ICD-10-CM

## 2023-05-25 DIAGNOSIS — Z1331 Encounter for screening for depression: Secondary | ICD-10-CM | POA: Diagnosis not present

## 2023-05-25 DIAGNOSIS — M1990 Unspecified osteoarthritis, unspecified site: Secondary | ICD-10-CM | POA: Diagnosis not present

## 2023-05-25 DIAGNOSIS — R0602 Shortness of breath: Secondary | ICD-10-CM | POA: Diagnosis not present

## 2023-05-25 DIAGNOSIS — Z6841 Body Mass Index (BMI) 40.0 and over, adult: Secondary | ICD-10-CM

## 2023-05-25 DIAGNOSIS — Z9189 Other specified personal risk factors, not elsewhere classified: Secondary | ICD-10-CM

## 2023-05-25 DIAGNOSIS — R5383 Other fatigue: Secondary | ICD-10-CM | POA: Diagnosis not present

## 2023-05-25 DIAGNOSIS — I10 Essential (primary) hypertension: Secondary | ICD-10-CM

## 2023-05-25 NOTE — Progress Notes (Signed)
Carlye Grippe, D.O.  ABFM, ABOM Specializing in Clinical Bariatric Medicine Office located at: [address] [place], [place]  [address]   Assessment and Plan:   FOR THE DISEASE OF OBESITY:  Recommended Dietary Goals [name] is currently in the action stage of change. As such, his goal is to continue weight management plan. He has agreed to: begin Category 3 meal plan with only 200 snack calories.   Behavioral Intervention We discussed the following Behavioral Modification Strategies today: begin to work on maintaining a reduced calorie state, getting the recommended amount of protein, incorporating whole foods, making healthy choices, staying well hydrated and practicing mindfulness when eating..  Additional resources provided today: Category 3 meal plan handout.   Evidence-based interventions for health behavior change were utilized today including the discussion of self monitoring techniques, problem-solving barriers and SMART goal setting techniques.   Regarding patient's less desirable eating habits and patterns, we employed the technique of small changes.   Pt will specifically work on: n/a   Recommended Physical Activity Goals [name] has been advised to work up to 150 minutes of moderate intensity aerobic activity a week and strengthening exercises 2-3 times per week for cardiovascular health, weight loss maintenance and preservation of muscle mass.   He has agreed to : Continue current level of physical activity    Pharmacotherapy We discussed various medication options to help [name] with his weight loss efforts and we both agreed to : continue current anti-obesity medication regimen   FOR ASSOCIATED CONDITIONS ADDRESSED TODAY:  Fatigue Assessment & Plan: [name] does feel that his weight is causing his energy to be lower than it should be. Fatigue may be related to obesity, depression or many other causes. he does not appear to have any red flag symptoms and  this appears to most likely be related to his current lifestyle habits and dietary intake.  Epworth sleepiness scale is 8 and  appears to be within normal limits.  [name] admits to occasional daytime somnolence and denies waking up still tired. [name] generally gets  8+  hours of sleep per night, and states that he has generally restful sleep. Snoring is present. Apneic episodes are not present.   ECG: Performed and reviewed/ interpreted independently. Normal sinus rhythm, rate 78 bpm; reassuring without any acute abnormalities, will continue to monitor for symptoms.   Orders: -     VITAMIN D 25 Hydroxy (Vit-D Deficiency, Fractures) -     Folate -     Vitamin B12 -     EKG 12-Lead   Shortness of breath on exertion Assessment & Plan: [name] does feel that he gets out of breath more easily than he used to when he exercises and seems to be worsening over time with weight gain.  This has gotten worse recently. [name] denies shortness of breath at rest or orthopnea. [name]'s shortness of breath appears to be obesity related and exercise induced, as they do not appear to have any "red flag" symptoms/ concerns today.  Also, this condition appears to be related to a state of poor cardiovascular conditioning   Obtain labs today and will be reviewed with him at their next office visit in two weeks.  Indirect Calorimeter completed today to help guide our dietary regimen. It shows a VO2 of 310 and a REE of 2146.  His calculated basal metabolic rate is 9528 thus his measured basal metabolic rate is worse than expected.  Patient agreed to work on weight loss at this  time.  As [name] progresses through our weight loss program, we will gradually increase exercise as tolerated to treat his current condition.   If [name] follows our recommendations and loses 5-10% of their weight without improvement of his shortness of breath or if at any time, symptoms become more concerning, they agree to urgently follow up with their  PCP/ specialist for further consideration/ evaluation.   [name] verbalizes agreement with this plan.    Depression screen [Z13.31] Assessment & Plan: His depression screening was negative at 2 - no concerns. Denies any issues with emotional eating.    At risk for cardiovascular event  Assessment & Plan: Due to [name]'s current state of health and medical condition(s), he is at a higher risk for heart disease.  This puts the patient at much greater risk to subsequently develop cardiopulmonary conditions that can significantly affect patient's quality of life in a negative manner as well.    At least 20 minutes were spent on counseling [name] about these concerns today, and we discussed the importance of reversing risks factors of obesity, especially truncal and visceral fat, hypertension,and pre-diabetes.  The initial goal is to lose at least 5-10% of starting weight to help reduce these risk factors.    Counseling: Intensive lifestyle modifications were discussed with [name] as the most appropriate first line of treatment. He will begin  to work on diet, exercise, and weight loss efforts.  We will continue to reassess these conditions on a fairly regular basis in an attempt to decrease the patient's overall morbidity and mortality.   Hypertension, unspecified type Assessment & Plan: Last 3 blood pressure readings in our office are as follows: BP Readings from Last 3 Encounters:  05/25/23 (!) 165/83  05/04/23 (!) 150/90  05/22/21 98/65   HTN treated with Hydrodiruil 25 mg daily & Cozaar 100 mg daily - pt reports not consistently taking both antihypertensives. BP above target; pt is completely asymptomatic. He does not check blood pressure at home.   Pt encouraged to take both antihypertensives more consisently. Check blood pressure at home 2-3 times a week. Additionally, lifestyle changes such as following our low salt, heart healthy meal plan and engaging in a regular  exercise program discussed.   Orders: -     CBC with Differential/Platelet -     Comprehensive metabolic panel -     Lipid Panel With LDL/HDL Ratio -     T4, free -     TSH   Prediabetes Assessment & Plan: Most recent A1c:  Lab Results  Component Value Date   HGBA1C 5.4 09/04/2015   HGBA1C 6.2 (H) 04/13/2015   HGBA1C 6.0 (H) 04/14/2014    Pt reports being diagnosed with prediabetes roughly 8-9 years ago. Endorses that he has been on Metformin 500 mg once daily for several years now. He reports being "borderline diabetic".   Pt advised to maintain with Metformin at current dose. Begin to decrease simple carbs/ sugars; increase fiber and proteins -> follow his meal plan.   Orders: -     CBC with Differential/Platelet -     Hemoglobin A1c -     Insulin, random -     Lipid Panel With LDL/HDL Ratio   Generalized arthritis of both knees  Assessment & Plan: Pt reports having bilateral chronic knee pain. He had imaging done and was noted to have OA in bilateral knees with cartilage loss in L knee. He sees both an orthopedist and pain  management specialist. His orthopedist has him on Meloxicam 7.5 mg daily. He is additionally on oxycodone -acetaminophen prn. Once his BMI is under 40, pt plans to have knee surgery.   Continue with both medications per specialists. Begin weight loss therapy via reduced calorie nutritional plan.    BMI 50.0-59.9, adult (HCC)  Morbid obesity with BMI of 50.0-59.9, adult [facility]) Assessment & Plan: See obesity treatment note.    FOLLOW UP:   Follow up in 2 weeks. He was informed of the importance of frequent follow up visits to maximize his success with intensive lifestyle modifications for his multiple health conditions.  [name] is aware that we will review all of his lab results at our next visit.  He is aware that if anything is critical/ life threatening with the results, we will be contacting him via MyChart prior to the office visit to  discuss management.    Chief Complaint:   OBESITY [name] (MR# [id number]) is a pleasant 62 y.o. male who presents for evaluation and treatment of obesity and related comorbidities. Current BMI is Body mass index is 55.09 kg/m. [name] has been struggling with his weight for many years and has been unsuccessful in either losing weight, maintaining weight loss, or reaching his healthy weight goal.  [name] is currently in the action stage of change and ready to dedicate time achieving and maintaining a healthier weight. [name] is interested in becoming our patient and working on intensive lifestyle modifications including (but not limited to) diet and exercise for weight loss.  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 Patient is married to [place] (3rd wife). He lives with his wife and their 3 sons (6, 70 , & 10 y.o).   Mr. Whittenberg started gaining at 62 y.o after his 1st marriage.   He desires to be under 300 lbs in 4-5 months for upcoming knee surgery.   He has never been on a formal meal plan.  Yrs ago he cut back on fried foods, soda, and sweets - lost 200 lbs, later regained.   Eats fast food or take out 4-5 times a week and eats outside the home daily.   Craves: "whatever my wife cooks".   Snacks on popcorn, ice cream, and chips.   Sometimes skips lunch and or eats 1-2 meals per day. Reports he "can go all day without eating".   Drinks juice and tea with sugar and sweeteners.   Worst food habit: fried foods.   Subjective:   This is the patient's first visit at [facility].  The patient's NEW PATIENT PACKET that they filled out prior to today's office visit was reviewed at length and information from that paperwork was included within the following office visit note.    Included in the packet: current and past health history, medications, allergies, ROS, gynecologic history (women only), surgical history,  family history, social history, weight history, weight loss surgery history (for those that have had weight loss surgery), nutritional evaluation, mood and food questionnaire along with a depression screening (PHQ9) on all patients, an Epworth questionnaire, sleep habits questionnaire, patient life and health improvement goals questionnaire. These will all be scanned into the patient's chart under the "media" tab.   Review of Systems: Please refer to new patient packet scanned into media. Pertinent positives were addressed with patient today.  Reviewed by clinician on day of visit: allergies, medications, problem  list, medical history, surgical history, family history, social history, and previous encounter notes.  During the visit, I independently reviewed the patient's EKG, bioimpedance scale results, and indirect calorimeter results. I used this information to tailor a meal plan for the patient that will help [name] to lose weight and will improve his obesity-related conditions going forward.  I performed a medically necessary appropriate examination and/or evaluation. I discussed the assessment and treatment plan with the patient. The patient was provided an opportunity to ask questions and all were answered. The patient agreed with the plan and demonstrated an understanding of the instructions. Labs were ordered today (unless patient declined them) and will be reviewed with the patient at our next visit unless more critical results need to be addressed immediately. Clinical information was updated and documented in the EMR.   Objective:   PHYSICAL EXAM: Blood pressure (!) 165/83, pulse 86, temperature 97.9 F (36.6 C), height 5' 7.5" (1.715 m), weight (!) 357 lb (161.9 kg), SpO2 95%. Body mass index is 55.09 kg/m.  General: Well Developed, well nourished, and in no acute distress.  HEENT: Normocephalic, atraumatic; EOMI, sclerae are anicteric. Skin: Warm and dry, good turgor Chest:   Normal excursion, shape, no gross ABN Respiratory: No conversational dyspnea; speaking in full sentences NeuroM-Sk:  Normal gross ROM * 4 extremities  Psych: A and O *3, insight adequate, mood- full   Anthropometric Measurements Height: 5' 7.5" (1.715 m) Weight: (!) 357 lb (161.9 kg) BMI (Calculated): 55.06 Weight at Last Visit: na Weight Lost Since Last Visit: na Weight Gained Since Last Visit: na Starting Weight: 357lb Total Weight Loss (lbs): 0 lb (0 kg) Peak Weight: 500 Waist Measurement : 60 inches   Body Composition  Body Fat %: 48.5 % Fat Mass (lbs): 173.2 lbs Muscle Mass (lbs): 175 lbs Visceral Fat Rating : 39   Other Clinical Data RMR: 2146 Fasting: Yes Labs: Yes Today's Visit #: 1 Starting Date: 05/25/23 Comments: first visit   DIAGNOSTIC DATA REVIEWED:  BMET    Component Value Date/Time   NA 137 06/18/2015 1930   K 4.0 06/18/2015 1930   CL 100 (L) 06/18/2015 1930   CO2 28 06/18/2015 1930   GLUCOSE 120 (H) 06/18/2015 1930   BUN 8 06/18/2015 1930   CREATININE 0.82 06/18/2015 1930   CALCIUM 9.6 06/18/2015 1930   GFRNONAA >60 06/18/2015 1930   GFRAA >60 06/18/2015 1930   Lab Results  Component Value Date   HGBA1C 5.4 09/04/2015   HGBA1C 6.0 (H) 04/14/2014   No results found for: "INSULIN" No results found for: "TSH" CBC    Component Value Date/Time   WBC 10.4 06/18/2015 1930   RBC 4.39 06/18/2015 1930   HGB 13.0 06/18/2015 1930   HCT 39.3 06/18/2015 1930   PLT 157 06/18/2015 1930   MCV 89.5 06/18/2015 1930   MCH 29.6 06/18/2015 1930   MCHC 33.1 06/18/2015 1930   RDW 13.7 06/18/2015 1930   Iron Studies No results found for: "IRON", "TIBC", "FERRITIN", "IRONPCTSAT" Lipid Panel     Component Value Date/Time   CHOL 187 04/14/2015 0439   TRIG 151 (H) 04/14/2015 0439   HDL 29 (L) 04/14/2015 0439   CHOLHDL 6.4 04/14/2015 0439   VLDL 30 04/14/2015 0439   LDLCALC 128 (H) 04/14/2015 0439   Hepatic Function Panel     Component Value  Date/Time   PROT 6.9 04/13/2015 1254   ALBUMIN 2.9 (L) 04/13/2015 1254   AST 33 04/13/2015 1254  ALT 28 04/13/2015 1254   ALKPHOS 58 04/13/2015 1254   BILITOT 0.6 04/13/2015 1254   No results found for: "TSH" Nutritional No results found for: "VD25OH"  Attestation Statements:   I, Special Puri, acting as a Stage manager for [employer], DO., have compiled all relevant documentation for today's office visit on behalf of Thomasene Lot, DO, while in the presence of [employer], DO.  Reviewed by clinician on day of visit: allergies, medications, problem list, medical history, surgical history, family history, social history, and previous encounter notes pertinent to patient's obesity diagnosis. I have spent 60 minutes in the care of the patient today including: preparing to see patient (e.g. review and interpretation of tests, old notes ), obtaining and/or reviewing separately obtained history, performing a medically appropriate examination or evaluation, counseling and educating the patient, ordering medications, test or procedures, documenting clinical information in the electronic or other health care record, and independently interpreting results and communicating results to the patient, family, or caregiver   I have reviewed the above documentation for accuracy and completeness, and I agree with the above. Carlye Grippe, D.O.  The 21st Century Cures Act was signed into law in 2016 which includes the topic of electronic health records.  This provides immediate access to information in MyChart.  This includes consultation notes, operative notes, office notes, lab results and pathology reports.  If you have any questions about what you read please let us know at your next visit so we can discuss your concerns and take corrective action if need be.  We are right here with you.

## 2023-05-26 LAB — CBC WITH DIFFERENTIAL/PLATELET
Basophils Absolute: 0 10*3/uL (ref 0.0–0.2)
Basos: 1 %
EOS (ABSOLUTE): 0.1 10*3/uL (ref 0.0–0.4)
Eos: 2 %
Hematocrit: 42.4 % (ref 37.5–51.0)
Hemoglobin: 13.7 g/dL (ref 13.0–17.7)
Immature Grans (Abs): 0 10*3/uL (ref 0.0–0.1)
Immature Granulocytes: 0 %
Lymphocytes Absolute: 2 10*3/uL (ref 0.7–3.1)
Lymphs: 34 %
MCH: 29.2 pg (ref 26.6–33.0)
MCHC: 32.3 g/dL (ref 31.5–35.7)
MCV: 90 fL (ref 79–97)
Monocytes Absolute: 0.5 10*3/uL (ref 0.1–0.9)
Monocytes: 8 %
Neutrophils Absolute: 3.2 10*3/uL (ref 1.4–7.0)
Neutrophils: 55 %
Platelets: 141 10*3/uL — ABNORMAL LOW (ref 150–450)
RBC: 4.69 x10E6/uL (ref 4.14–5.80)
RDW: 13.3 % (ref 11.6–15.4)
WBC: 5.8 10*3/uL (ref 3.4–10.8)

## 2023-05-26 LAB — COMPREHENSIVE METABOLIC PANEL
ALT: 24 [IU]/L (ref 0–44)
AST: 21 [IU]/L (ref 0–40)
Albumin: 3.9 g/dL (ref 3.8–4.9)
Alkaline Phosphatase: 75 [IU]/L (ref 44–121)
BUN/Creatinine Ratio: 12 (ref 10–24)
BUN: 11 mg/dL (ref 8–27)
Bilirubin Total: 0.5 mg/dL (ref 0.0–1.2)
CO2: 24 mmol/L (ref 20–29)
Calcium: 9.6 mg/dL (ref 8.6–10.2)
Chloride: 103 mmol/L (ref 96–106)
Creatinine, Ser: 0.93 mg/dL (ref 0.76–1.27)
Globulin, Total: 3.5 g/dL (ref 1.5–4.5)
Glucose: 88 mg/dL (ref 70–99)
Potassium: 4.2 mmol/L (ref 3.5–5.2)
Sodium: 143 mmol/L (ref 134–144)
Total Protein: 7.4 g/dL (ref 6.0–8.5)
eGFR: 94 mL/min/{1.73_m2} (ref >59)

## 2023-05-26 LAB — INSULIN, RANDOM: INSULIN: 11.1 u[IU]/mL (ref 2.6–24.9)

## 2023-05-26 LAB — HEMOGLOBIN A1C
Est. average glucose Bld gHb Est-mCnc: 126 mg/dL
Hgb A1c MFr Bld: 6 % — ABNORMAL HIGH (ref 4.8–5.6)

## 2023-05-26 LAB — LIPID PANEL WITH LDL/HDL RATIO
Cholesterol, Total: 218 mg/dL — ABNORMAL HIGH (ref 100–199)
HDL: 48 mg/dL (ref >39)
LDL Chol Calc (NIH): 152 mg/dL — ABNORMAL HIGH (ref 0–99)
LDL/HDL Ratio: 3.2 ratio (ref 0.0–3.6)
Triglycerides: 100 mg/dL (ref 0–149)
VLDL Cholesterol Cal: 18 mg/dL (ref 5–40)

## 2023-05-26 LAB — T4, FREE: Free T4: 1.28 ng/dL (ref 0.82–1.77)

## 2023-05-26 LAB — TSH: TSH: 2.15 u[IU]/mL (ref 0.450–4.500)

## 2023-05-26 LAB — FOLATE: Folate: 7.5 ng/mL

## 2023-05-26 LAB — VITAMIN B12: Vitamin B-12: 539 pg/mL (ref 232–1245)

## 2023-05-26 LAB — VITAMIN D 25 HYDROXY (VIT D DEFICIENCY, FRACTURES): Vit D, 25-Hydroxy: 12.1 ng/mL — ABNORMAL LOW (ref 30.0–100.0)

## 2023-06-08 ENCOUNTER — Ambulatory Visit (INDEPENDENT_AMBULATORY_CARE_PROVIDER_SITE_OTHER): Payer: BC Managed Care – PPO | Admitting: Family Medicine

## 2023-06-08 ENCOUNTER — Encounter (INDEPENDENT_AMBULATORY_CARE_PROVIDER_SITE_OTHER): Payer: Self-pay | Admitting: Family Medicine

## 2023-06-08 VITALS — BP 144/86 | HR 72 | Temp 98.0°F | Ht 67.5 in | Wt 348.0 lb

## 2023-06-08 DIAGNOSIS — Z6841 Body Mass Index (BMI) 40.0 and over, adult: Secondary | ICD-10-CM

## 2023-06-08 DIAGNOSIS — I10 Essential (primary) hypertension: Secondary | ICD-10-CM

## 2023-06-08 DIAGNOSIS — E7849 Other hyperlipidemia: Secondary | ICD-10-CM | POA: Diagnosis not present

## 2023-06-08 DIAGNOSIS — E559 Vitamin D deficiency, unspecified: Secondary | ICD-10-CM

## 2023-06-08 DIAGNOSIS — R7303 Prediabetes: Secondary | ICD-10-CM | POA: Diagnosis not present

## 2023-06-08 MED ORDER — VITAMIN D (ERGOCALCIFEROL) 1.25 MG (50000 UNIT) PO CAPS: 50000.0000 [IU] | ORAL_CAPSULE | ORAL | 0 refills | Status: DC

## 2023-06-08 NOTE — Patient Instructions (Signed)
The 10-year ASCVD risk score (Arnett DK, et al., 2019) is: 30%   Values used to calculate the score:     Age: 62 years     Sex: Male     Is Non-Hispanic African American: Yes     Diabetic: Yes     Tobacco smoker: No     Systolic Blood Pressure: 144 mmHg     Is BP treated: Yes     HDL Cholesterol: 48 mg/dL     Total Cholesterol: 218 mg/dL

## 2023-06-08 NOTE — Progress Notes (Signed)
[name], D.O.  ABFM, ABOM Clinical Bariatric Medicine Physician  Office located at: [address] [place], [place]  [address]   Assessment and Plan:   FOR THE DISEASE OF OBESITY: BMI 50.0-59.9, adult (HCC)-current 53.67 Morbid obesity with BMI of 50.0-59.9, adult (HCC)-starting bmi 05/25/23 55.06 Assessment & Plan: Since last office visit on 05/25/23 patient's muscle mass has increased by 1.8 lb. Fat mass has decreased by 10.4 lb. Total body water is 145.4 lbs. Counseling done on how various foods will affect these numbers and how to maximize success  Total lbs lost to date: 9 lbs  Total weight loss percentage to date: 2.52%    Recommended Dietary Goals [name] is currently in the action stage of change. As such, his goal is to continue weight management plan.  He has agreed to: continue current plan   Behavioral Intervention We discussed the following today: Ocean Spray Juices, Laughing Cow Light cheese wedges, continue to work on maintaining a reduced calorie state, getting the recommended amount of protein, incorporating whole foods, making healthy choices, staying well hydrated and practicing mindfulness when eating.  Additional resources provided today:  Healthy [employer]  Evidence-based interventions for health behavior change were utilized today including the discussion of self monitoring techniques, problem-solving barriers and SMART goal setting techniques.   Regarding patient's less desirable eating habits and patterns, we employed the technique of small changes.   Pt will specifically work on: n/a   Recommended Physical Activity Goals [name] has been advised to work up to 150 minutes of moderate intensity aerobic activity a week and strengthening exercises 2-3 times per week for cardiovascular health, weight loss maintenance and preservation of muscle mass.   He has agreed to : Continue current level of physical activity     Pharmacotherapy We both agreed to : continue with nutritional and behavioral strategies   FOR ASSOCIATED CONDITIONS ADDRESSED TODAY:  Other hyperlipidemia  Assessment & Plan: Lab Results  Component Value Date   CHOL 218 (H) 05/25/2023   HDL 48 05/25/2023   LDLCALC 152 (H) 05/25/2023   TRIG 100 05/25/2023   CHOLHDL 6.4 04/14/2015   The 10-year ASCVD risk score (Arnett DK, et al., 2019) is: 30%   Values used to calculate the score:     Age: 62 years     Sex: Male     Is Non-Hispanic African American: Yes     Diabetic: Yes     Tobacco smoker: No     Systolic Blood Pressure: 144 mmHg     Is BP treated: Yes     HDL Cholesterol: 48 mg/dL     Total Cholesterol: 218 mg/dL  No current meds. Diet/exercise approach. LDL is elevated at 152 - prior LDL was 128 on 04/2015. Additionally on 04/2015, his TRIG were elevated at 151 - it is stable at 100 now.   I encouraged pt to discuss with PCP about initiating statin therapy at next OV. Additionally, I stressed the importance that patient continue with our prudent nutritional plan that is low in saturated and trans fats, and low in fatty carbs to improve these numbers. We recommend: aerobic activity with eventual goal of a minimum of 150+ min wk plus 2 days/ week of resistance or strength training.     Hypertension, unspecified type Assessment & Plan: Last 3 blood pressure readings in our office are as follows: BP Readings from Last 3 Encounters:  06/08/23 (!) 144/86  05/25/23 (!) 165/83  05/04/23 Marland Kitchen)  150/90   HTN treated with Hydrochlorothiazide and Losartan. Blood pressure has improved since LOV, however is still above target. Pt is completely asymptomatic. He does not check BP at home.   Continue with all antihypertensives at current doses. Recommended pt to purchase a blood pressure cuff (e.g Omron Series 3) and check bp 2-3 times a week at home. Continue with low sodium diet plan.    Prediabetes Assessment & Plan: Lab  Results  Component Value Date   HGBA1C 6.0 (H) 05/25/2023   HGBA1C 5.4 09/04/2015   HGBA1C 6.2 (H) 04/13/2015   INSULIN 11.1 05/25/2023   Lab Results  Component Value Date   CREATININE 0.93 05/25/2023   BUN 11 05/25/2023   NA 143 05/25/2023   K 4.2 05/25/2023   CL 103 05/25/2023   CO2 24 05/25/2023      Component Value Date/Time   PROT 7.4 05/25/2023 1029   ALBUMIN 3.9 05/25/2023 1029   AST 21 05/25/2023 1029   ALT 24 05/25/2023 1029   ALKPHOS 75 05/25/2023 1029   BILITOT 0.5 05/25/2023 1029    Lab Results  Component Value Date   WBC 5.8 05/25/2023   HGB 13.7 05/25/2023   HCT 42.4 05/25/2023   MCV 90 05/25/2023   PLT 141 (L) 05/25/2023   Lab Results  Component Value Date   TSH 2.150 05/25/2023   FREET4 1.28 05/25/2023   Lab Results  Component Value Date   VITAMINB12 539 05/25/2023   FOLATE 7.5 05/25/2023    No current meds. Diet/exercise approach. Hemoglobin A1c has worsened from 5.4 to 6.0. Fasting insulin is roughly 2 times normal. Kidney function, electrolytes, and liver enzymes are within normal limits. Blood counts are stable and do not indicate anemia. No concerns with thyroid levels, B12, and folate.   I recommended the patient to drink approximately half of their body weight in ounces of water daily. Additionally, have an extra bottle of water for every 30 minutes of exercise. Continue to decrease simple carbs/ sugars; increase fiber and proteins -> follow his meal plan.     Vitamin D deficiency - new onset Assessment & Plan: Lab Results  Component Value Date   VD25OH 12.1 (L) 05/25/2023   Vitamin D levels are sub-optimal at 12.1; Goal 50 to 70-80.   I discussed the importance of vitamin D to the patient's health and well-being as well as to their ability to lose weight. I reviewed possible symptoms of low Vitamin D:  low energy, depressed mood, muscle aches, joint aches, osteoporosis etc. with patient. It has been show that administration of vitamin D  supplementation leads to improved satiety and a decrease in inflammatory markers.  Hence, low Vitamin D levels may be linked to an increased risk of cardiovascular events and even increased risk of cancers- such as colon and breast. Pt is agreeable to starting ERGO 50,000 units once a week.  Orders: -     Vitamin D (Ergocalciferol); Take 1 capsule (50,000 Units total) by mouth every 7 (seven) days.  Dispense: 5 capsule; Refill: 0   FOLLOW UP:   Return 06/21/23. He was informed of the importance of frequent follow up visits to maximize his success with intensive lifestyle modifications for his multiple health conditions.  Subjective:   Chief complaint: Obesity Serjio is here to discuss his progress with his obesity treatment plan. He is on the Category 3 Plan with only 200 snack calories and states he is following his eating plan approximately 90-100 % of the time.  He states he is not exercising.   Interval History:  [name] is here today for his first follow-up office visit since starting the program with [place]. Since last office visit he is down 7 lbs. He endorses strong adherence to his prudent nutritional plan. Hunger and cravings are controlled when he eats all the protein on his meal plan. He does not feel bored with any of the foods.   All blood work/ lab tests that were recently ordered by myself or an outside provider were reviewed with patient today per their request. Extended time was spent counseling him on all new disease processes that were discovered or preexisting ones that are affected by BMI.  he understands that many of these abnormalities will need to monitored regularly along with the current treatment plan of prudent dietary changes, in which we are making each and every office visit, to improve these health parameters.  Pharmacotherapy for weight loss: He is not currently taking any anti-obesity medication.   Review of Systems:  Pertinent positives were addressed with  patient today.  Reviewed by clinician on day of visit: allergies, medications, problem list, medical history, surgical history, family history, social history, and previous encounter notes.  Weight Summary and Biometrics   Weight Lost Since Last Visit: 9lb  Weight Gained Since Last Visit: 0lb   Vitals Temp: 98 F (36.7 C) BP: (!) 144/86 Pulse Rate: 72 SpO2: 97 %   Anthropometric Measurements Height: 5' 7.5" (1.715 m) Weight: (!) 348 lb (157.9 kg) BMI (Calculated): 53.67 Weight at Last Visit: 357lb Weight Lost Since Last Visit: 9lb Weight Gained Since Last Visit: 0lb Starting Weight: 357lb Total Weight Loss (lbs): 9 lb (4.082 kg) Peak Weight: 500lb   Body Composition  Body Fat %: 46.7 % Fat Mass (lbs): 162.8 lbs Muscle Mass (lbs): 176.8 lbs Total Body Water (lbs): 145.4 lbs Visceral Fat Rating : 37   Other Clinical Data Fasting: no Labs: no Today's Visit #: 2 Starting Date: 05/25/23   Objective:   PHYSICAL EXAM:  Blood pressure (!) 144/86, pulse 72, temperature 98 F (36.7 C), height 5' 7.5" (1.715 m), weight (!) 348 lb (157.9 kg), SpO2 97%. Body mass index is 53.7 kg/m.  General: he is overweight, cooperative and in no acute distress.   HEENT: EOMI, sclerae are anicteric. Lungs: Normal breathing effort, no conversational dyspnea. M-Sk:  Normal gross ROM * 4 extremities  PSYCH: Has normal mood, affect and thought process. Neurologic: No gross sensory or motor deficits. Well developed, A and O * 3  DIAGNOSTIC DATA REVIEWED:  BMET    Component Value Date/Time   NA 143 05/25/2023 1029   K 4.2 05/25/2023 1029   CL 103 05/25/2023 1029   CO2 24 05/25/2023 1029   GLUCOSE 88 05/25/2023 1029   GLUCOSE 120 (H) 06/18/2015 1930   BUN 11 05/25/2023 1029   CREATININE 0.93 05/25/2023 1029   CALCIUM 9.6 05/25/2023 1029   GFRNONAA >60 06/18/2015 1930   GFRAA >60 06/18/2015 1930   Lab Results  Component Value Date   HGBA1C 6.0 (H) 05/25/2023   HGBA1C 6.0  (H) 04/14/2014   Lab Results  Component Value Date   INSULIN 11.1 05/25/2023   Lab Results  Component Value Date   TSH 2.150 05/25/2023   CBC    Component Value Date/Time   WBC 5.8 05/25/2023 1029   WBC 10.4 06/18/2015 1930   RBC 4.69 05/25/2023 1029   RBC 4.39 06/18/2015 1930   HGB 13.7 05/25/2023 1029  HCT 42.4 05/25/2023 1029   PLT 141 (L) 05/25/2023 1029   MCV 90 05/25/2023 1029   MCH 29.2 05/25/2023 1029   MCH 29.6 06/18/2015 1930   MCHC 32.3 05/25/2023 1029   MCHC 33.1 06/18/2015 1930   RDW 13.3 05/25/2023 1029   Iron Studies No results found for: "IRON", "TIBC", "FERRITIN", "IRONPCTSAT" Lipid Panel     Component Value Date/Time   CHOL 218 (H) 05/25/2023 1029   TRIG 100 05/25/2023 1029   HDL 48 05/25/2023 1029   CHOLHDL 6.4 04/14/2015 0439   VLDL 30 04/14/2015 0439   LDLCALC 152 (H) 05/25/2023 1029   Hepatic Function Panel     Component Value Date/Time   PROT 7.4 05/25/2023 1029   ALBUMIN 3.9 05/25/2023 1029   AST 21 05/25/2023 1029   ALT 24 05/25/2023 1029   ALKPHOS 75 05/25/2023 1029   BILITOT 0.5 05/25/2023 1029      Component Value Date/Time   TSH 2.150 05/25/2023 1029   Nutritional Lab Results  Component Value Date   VD25OH 12.1 (L) 05/25/2023    Attestations:   Reviewed by clinician on day of visit: allergies, medications, problem list, medical history, surgical history, family history, social history, and previous encounter notes pertinent to patient's obesity diagnosis.   I have spent 50 minutes in the care of the patient today including: preparing to see patient (e.g. review and interpretation of tests, old notes ), obtaining and/or reviewing separately obtained history, performing a medically appropriate examination or evaluation, counseling and educating the patient, ordering medications, test or procedures, documenting clinical information in the electronic or other health care record, and independently interpreting results and  communicating results to the patient, family, or caregiver   I, Special Randolm Idol, acting as a Stage manager for Thomasene Lot, DO., have compiled all relevant documentation for today's office visit on behalf of Thomasene Lot, DO, while in the presence of [employer], DO.  I have reviewed the above documentation for accuracy and completeness, and I agree with the above. [name], D.O.  The 21st Century Cures Act was signed into law in 2016 which includes the topic of electronic health records.  This provides immediate access to information in MyChart.  This includes consultation notes, operative notes, office notes, lab results and pathology reports.  If you have any questions about what you read please let us know at your next visit so we can discuss your concerns and take corrective action if need be.  We are right here with you.

## 2023-06-21 ENCOUNTER — Ambulatory Visit (INDEPENDENT_AMBULATORY_CARE_PROVIDER_SITE_OTHER): Payer: BC Managed Care – PPO | Admitting: Family Medicine

## 2023-07-13 ENCOUNTER — Ambulatory Visit (INDEPENDENT_AMBULATORY_CARE_PROVIDER_SITE_OTHER): Payer: BC Managed Care – PPO | Admitting: Family Medicine

## 2023-08-21 ENCOUNTER — Other Ambulatory Visit (INDEPENDENT_AMBULATORY_CARE_PROVIDER_SITE_OTHER): Payer: Self-pay | Admitting: Family Medicine

## 2023-12-08 ENCOUNTER — Ambulatory Visit
Admission: EM | Admit: 2023-12-08 | Discharge: 2023-12-08 | Disposition: A | Discharge status: Home / Self Care | Attending: Family Medicine | Admitting: Family Medicine

## 2023-12-08 ENCOUNTER — Ambulatory Visit (INDEPENDENT_AMBULATORY_CARE_PROVIDER_SITE_OTHER)

## 2023-12-08 DIAGNOSIS — R030 Elevated blood-pressure reading, without diagnosis of hypertension: Secondary | ICD-10-CM | POA: Diagnosis not present

## 2023-12-08 DIAGNOSIS — M542 Cervicalgia: Secondary | ICD-10-CM

## 2023-12-08 DIAGNOSIS — M5030 Other cervical disc degeneration, unspecified cervical region: Secondary | ICD-10-CM | POA: Diagnosis not present

## 2023-12-08 MED ORDER — CYCLOBENZAPRINE HCL 5 MG PO TABS: 5.0000 mg | ORAL_TABLET | Freq: Three times a day (TID) | ORAL | 0 refills | Status: DC | PRN

## 2023-12-08 MED ORDER — PREDNISONE 10 MG (21) PO TBPK: ORAL_TABLET | Freq: Every day | ORAL | 0 refills | Status: DC

## 2023-12-08 NOTE — Discharge Instructions (Addendum)
 Your neck x-ray did not show any broken or dislocated bones.  You did have some age-related changes which likely are the cause of your neck pain.  Continue taking the meloxicam . Take the prednisone  with food.  Take the muscle relaxer Flexeril /cyclobenzaprine  at bedtime and then as needed throughout the day.  Do not drive operate heavy machinery while taking this medication.  Call your pain care provider and ask them for an alteration in your pain plan to include pain medication for new pain in your neck.

## 2023-12-08 NOTE — ED Provider Notes (Addendum)
 [facility]    CSN: [id number] Arrival date & time: 12/08/23  1612      History   Chief Complaint Chief Complaint  Patient presents with   Neck Pain    HPI  HPI [name] is a 63 y.o. male   [name] presents for neck pain that started this morning after waking up.  Had similar pain on Sunday.  Has "2 bad knees." His knees buckled in the shower and he fell down straight. He caught himself  on the side of the tub. He had a crook in his neck on Friday but that resolved until after the fall. No pain over the weekend.   He went to work today but left due to the pain. Applied some salve to his shoulder and neck. The salve helped the shoulder but not the neck. Takes oxycodone 10s but this isnt helping his neck pain. Pain worse with movement. Better when looking towards the left.     Past Medical History:  Diagnosis Date   Bronchitis    Cellulitis and abscess of lower extremity 04/11/2015   Diabetes mellitus without complication (HCC)    Hypertension    Obesity    Skin infection     Patient Active Problem List   Diagnosis Date Noted   Scrotal abscess 06/19/2015   Cellulitis, scrotum 06/19/2015   Perineal abscess 06/19/2015   Cellulitis and abscess of leg 04/13/2015   Prediabetes 04/23/2014   Morbid obesity with BMI of 50.0-59.9, adult (HCC) 04/12/2014   Cellulitis 04/09/2014   Cellulitis of left leg 04/09/2014   Sepsis (HCC) 04/09/2014   Hypertension    Obesity    Bronchitis     Past Surgical History:  Procedure Laterality Date   INCISION AND DRAINAGE ABSCESS N/A 06/19/2015   Procedure: INCISION AND DRAINAGE SCROTAL ABSCESS;  Surgeon: Osborn Blaze, MD;  Location: [facility] OR;  Service: Urology;  Laterality: N/A;       Home Medications    Prior to Admission medications   Medication Sig Start Date End Date Taking? Authorizing Provider  albuterol  (VENTOLIN  HFA) 108 (90 Base) MCG/ACT inhaler Inhale 1 puff into the lungs 4 (four) times daily. 09/08/23  Yes  [provider]  aspirin  EC 81 MG tablet Take 81 mg by mouth daily. Swallow whole.   Yes [provider]  cyclobenzaprine  (FLEXERIL ) 5 MG tablet Take 1 tablet (5 mg total) by mouth 3 (three) times daily as needed. 12/08/23  Yes Jusitn Salsgiver, DO  hydrochlorothiazide (HYDRODIURIL) 25 MG tablet Take 25 mg by mouth daily.   Yes [provider]  losartan (COZAAR) 100 MG tablet Take 100 mg by mouth daily.   Yes [provider]  meloxicam  (MOBIC ) 7.5 MG tablet Take 7.5 mg by mouth daily. 02/09/23  Yes [provider]  Naproxen Sodium (ALEVE PO) Take by mouth.   Yes [provider]  predniSONE  (STERAPRED UNI-PAK 21 TAB) 10 MG (21) TBPK tablet Take by mouth daily. Take 6 tabs by mouth daily for 1, then 5 tabs for 1 day, then 4 tabs for 1 day, then 3 tabs for 1 day, then 2 tabs for 1 day, then 1 tab for 1 day. 12/08/23  Yes Brynli Ollis, DO  olmesartan-hydrochlorothiazide (BENICAR HCT) 20-12.5 MG tablet Take 1 tablet by mouth daily. 11/30/23   [provider]  oxyCODONE-acetaminophen  (PERCOCET) 10-325 MG tablet Take 1 tablet by mouth 4 (four) times daily as needed. 05/18/23   [provider]  Vitamin D , Ergocalciferol , (  DRISDOL ) 1.25 MG (50000 UNIT) CAPS capsule Take 1 capsule (50,000 Units total) by mouth every 7 (seven) days. 06/08/23   Marceil Sensor, DO    Family History Family History  Problem Relation Age of Onset   Diabetes Mother    Alcohol abuse Father    Hypertension Brother    Asthma Sister     Social History Social History   Tobacco Use   Smoking status: Never   Smokeless tobacco: Never  Substance Use Topics   Alcohol use: Yes   Drug use: No     Allergies   Oxycodone   Review of Systems Review of Systems: egative unless otherwise stated in HPI.      Physical Exam Triage Vital Signs ED Triage Vitals  Encounter Vitals Group     BP 12/08/23 1705 (!) 171/108     Systolic BP Percentile --       Diastolic BP Percentile --      Pulse Rate 12/08/23 1705 86     Resp 12/08/23 1705 16     Temp 12/08/23 1705 98.3 F (36.8 C)     Temp Source 12/08/23 1705 Oral     SpO2 12/08/23 1705 96 %     Weight 12/08/23 1704 (!) 343 lb (155.6 kg)     Height 12/08/23 1704 5\' 10"  (1.778 m)     Head Circumference --      Peak Flow --      Pain Score 12/08/23 1711 10     Pain Loc --      Pain Education --      Exclude from Growth Chart --    No data found.  Updated Vital Signs BP (!) 150/100 (BP Location: Left Arm)   Pulse 86   Temp 98.3 F (36.8 C) (Oral)   Resp 16   Ht 5\' 10"  (1.778 m)   Wt (!) 155.6 kg   SpO2 96%   BMI 49.22 kg/m   Visual Acuity Right Eye Distance:   Left Eye Distance:   Bilateral Distance:    Right Eye Near:   Left Eye Near:    Bilateral Near:     Physical Exam GEN: well appearing male in no acute distress  CVS: well perfused  RESP: speaking in full sentences without pause, no respiratory distress  MSK: 2 walking canes present  Cervical spine: - Inspection: no gross deformity or asymmetry, swelling or ecchymosis. No skin changes  - Palpation: TTP over the mid to lower cervical spinous processes with right sided paraspinal muscle tenderness and hypertonicity,  - ROM: good active ROM of the cervical spine in flexion and extension but with limited rotation to the right - Strength: 5/5 strength of lower extremity in L4-S1 nerve root distributions b/l - Neuro: sensation intact in the upper extremities  SKIN: warm, dry, no overly skin rash or erythema    UC Treatments / Results  Labs (all labs ordered are listed, but only abnormal results are displayed) Labs Reviewed - No data to display  EKG   Radiology DG Cervical Spine Complete Result Date: 12/08/2023 CLINICAL DATA:  Pain after fall in the shower Dec 04, 2023 EXAM: CERVICAL SPINE - COMPLETE 4+ VIEW COMPARISON:  None Available. FINDINGS: No evidence of acute fracture. Straightening of normal lordosis,  no evidence of listhesis. Bulky anterior osteophytes at C4-C5 and C5-C6, lesser osteophytes at C6-C7. Mild C5-C6 disc space narrowing. Lateral masses of C1 well aligned on C2. There is no prevertebral soft tissue thickening. IMPRESSION: 1.  No fracture or subluxation of the cervical spine. 2. Degenerative disc disease at C4-C5 and C5-C6. Electronically Signed   By: Chadwick Colonel M.D.   On: 12/08/2023 18:27     Procedures Procedures (including critical care time)  Medications Ordered in UC Medications - No data to display  Initial Impression / Assessment and Plan / UC Course  I have reviewed the triage vital signs and the nursing notes.  Pertinent labs & imaging results that were available during my care of the patient were reviewed by me and considered in my medical decision making (see chart for details).      Pt is a 63 y.o.  male with history of osteoarthritis presents for intermittent neck pain for the past week.  Noting that he did fall in the shower about a week ago.  On exam, he has midline  tenderness to the mid to lower cervical spine.  Obtained cervical plain films.  Xray personally interpreted by me were unremarkable for fracture, or significant malalignment.  Radiologist report reviewed and notes degenerative disc disease at C4-C5, C5-C6 and C6-C7.  With mild C5-C6 disc space narrowing.  Patient to gradually return to normal activities, as tolerated and continue ordinary activities within the limits permitted by pain. Prescribed prednisone  and muscle relaxer  for pain relief.  Advised patient to avoid other NSAIDs while taking meloxicam .. Tylenol  and Lidocaine  patches PRN for multimodal pain relief. Counseled patient on red flag symptoms and when to seek immediate care.  No red flags suggesting cauda equina syndrome or progressive major motor weakness. Patient to follow up with orthopedic provider if symptoms do not improve with conservative treatment.  Return and ED precautions  given.   Lerone is hypertensive here.  BP 171/108 then after sitting was 150/100.  He has known hypertension and also complicated by acute pain..  Takes losartan 100 mg.  Denies needing refills. Recommended monitor his blood pressures while taking prednisone .  He is to check his blood pressure and follow up with his primary care provider in the next 2 weeks.   Discussed MDM, treatment plan and plan for follow-up with patient who agrees with plan.   Final Clinical Impressions(s) / UC Diagnoses   Final diagnoses:  Neck pain  DDD (degenerative disc disease), cervical  Elevated blood pressure reading     Discharge Instructions      Your neck x-ray did not show any broken or dislocated bones.  You did have some age-related changes which likely are the cause of your neck pain.  Continue taking the meloxicam . Take the prednisone  with food.  Take the muscle relaxer Flexeril /cyclobenzaprine  at bedtime and then as needed throughout the day.  Do not drive operate heavy machinery while taking this medication.  Call your pain care provider and ask them for an alteration in your pain plan to include pain medication for new pain in your neck.    ED Prescriptions     Medication Sig Dispense Auth. Provider   cyclobenzaprine  (FLEXERIL ) 5 MG tablet Take 1 tablet (5 mg total) by mouth 3 (three) times daily as needed. 30 tablet Ryna Beckstrom, DO   predniSONE  (STERAPRED UNI-PAK 21 TAB) 10 MG (21) TBPK tablet Take by mouth daily. Take 6 tabs by mouth daily for 1, then 5 tabs for 1 day, then 4 tabs for 1 day, then 3 tabs for 1 day, then 2 tabs for 1 day, then 1 tab for 1 day. 21 tablet Devona Holmes, DO      I  have reviewed the PDMP during this encounter.     Cecilee Rosner, DO 12/08/23 1923

## 2023-12-08 NOTE — ED Triage Notes (Signed)
 Pt c/o L sided neck pain x5 days. States he thinks he jerked his neck when coming out of the tub. Pain worsened today. Has tried percocet which he takes for his knees w/o relief.

## 2024-04-05 ENCOUNTER — Emergency Department (HOSPITAL_COMMUNITY): Admission: EM | Admit: 2024-04-05 | Discharge: 2024-04-06 | Disposition: A | Discharge status: Home / Self Care

## 2024-04-05 ENCOUNTER — Emergency Department (HOSPITAL_COMMUNITY)

## 2024-04-05 DIAGNOSIS — R079 Chest pain, unspecified: Secondary | ICD-10-CM | POA: Diagnosis present

## 2024-04-05 DIAGNOSIS — Z7982 Long term (current) use of aspirin: Secondary | ICD-10-CM | POA: Insufficient documentation

## 2024-04-05 LAB — CBC
HCT: 43.7 % (ref 39.0–52.0)
Hemoglobin: 14.1 g/dL (ref 13.0–17.0)
MCH: 30 pg (ref 26.0–34.0)
MCHC: 32.3 g/dL (ref 30.0–36.0)
MCV: 93 fL (ref 80.0–100.0)
Platelets: 156 K/uL (ref 150–400)
RBC: 4.7 MIL/uL (ref 4.22–5.81)
RDW: 12.5 % (ref 11.5–15.5)
WBC: 7.8 K/uL (ref 4.0–10.5)
nRBC: 0 % (ref 0.0–0.2)

## 2024-04-05 LAB — BASIC METABOLIC PANEL WITH GFR
Anion gap: 12 (ref 5–15)
BUN: 12 mg/dL (ref 8–23)
CO2: 24 mmol/L (ref 22–32)
Calcium: 9.4 mg/dL (ref 8.9–10.3)
Chloride: 102 mmol/L (ref 98–111)
Creatinine, Ser: 0.79 mg/dL (ref 0.61–1.24)
GFR, Estimated: >60 mL/min (ref >60)
Glucose, Bld: 90 mg/dL (ref 70–99)
Potassium: 4.3 mmol/L (ref 3.5–5.1)
Sodium: 138 mmol/L (ref 135–145)

## 2024-04-05 LAB — TROPONIN I (HIGH SENSITIVITY): Troponin I (High Sensitivity): 11 ng/L (ref <18)

## 2024-04-05 NOTE — ED Triage Notes (Signed)
 Pt fell out of bed while sleeping and hit side table next to bed. Pt complains of lower lip pain. Hit lip on side table and hit chest on floor. Denies LOC. Denies Blood thinners. Complains of chest pain worse with movement.

## 2024-04-05 NOTE — ED Provider Notes (Signed)
 [place] [facility] DEPARTMENT AT [facility] Provider Note   CSN: [id number] Arrival date & time: 04/05/24  1109     Patient presents with: [name] is a 63 y.o. male.    Fall Pertinent negatives include no chest pain, no abdominal pain and no shortness of breath.   Endorses mechanical fall.  Patient states that he was asleep last night.  Subsequently rolled out of bed and landed face first onto the floor.  Since then, has been having pain in the middle of his chest whenever he moves certain directions.  Did not have any kind of chest pain before the event.  No exertional shortness of breath or chest pain.  No pleuritic chest pain or hemoptysis.  Denies any ACS history.  Patient states that he is fomites at rest but whenever he moves certain ways such as whenever he put his arms behind his back he subsequently has pain midsternal area.  This and present since the fall.  Otherwise, did not lose consciousness.  Has some pain in lower lip.  No midline neck pain.  No thoracic or lumbar pain.    Prior to Admission medications   Medication Sig Start Date End Date Taking? Authorizing Provider  albuterol  (VENTOLIN  HFA) 108 (90 Base) MCG/ACT inhaler Inhale 1 puff into the lungs 4 (four) times daily. 09/08/23   [provider]  aspirin  EC 81 MG tablet Take 81 mg by mouth daily. Swallow whole.    [provider]  cyclobenzaprine  (FLEXERIL ) 5 MG tablet Take 1 tablet (5 mg total) by mouth 3 (three) times daily as needed. 12/08/23   Brimage, Vondra, DO  hydrochlorothiazide (HYDRODIURIL) 25 MG tablet Take 25 mg by mouth daily.    [provider]  losartan (COZAAR) 100 MG tablet Take 100 mg by mouth daily.    [provider]  meloxicam  (MOBIC ) 7.5 MG tablet Take 7.5 mg by mouth daily. 02/09/23   [provider]  Naproxen Sodium (ALEVE PO) Take by mouth.    [provider]  olmesartan-hydrochlorothiazide (BENICAR HCT) 20-12.5 MG  tablet Take 1 tablet by mouth daily. 11/30/23   [provider]  oxyCODONE-acetaminophen  (PERCOCET) 10-325 MG tablet Take 1 tablet by mouth 4 (four) times daily as needed. 05/18/23   [provider]  predniSONE  (STERAPRED UNI-PAK 21 TAB) 10 MG (21) TBPK tablet Take by mouth daily. Take 6 tabs by mouth daily for 1, then 5 tabs for 1 day, then 4 tabs for 1 day, then 3 tabs for 1 day, then 2 tabs for 1 day, then 1 tab for 1 day. 12/08/23   Brimage, Vondra, DO  Vitamin D , Ergocalciferol , (DRISDOL ) 1.25 MG (50000 UNIT) CAPS capsule Take 1 capsule (50,000 Units total) by mouth every 7 (seven) days. 06/08/23   Midge Sober, DO    Allergies: Patient has no known allergies.    Review of Systems  Constitutional:  Negative for chills and fever.  HENT:  Negative for ear pain and sore throat.   Eyes:  Negative for pain and visual disturbance.  Respiratory:  Negative for cough and shortness of breath.   Cardiovascular:  Negative for chest pain and palpitations.  Gastrointestinal:  Negative for abdominal pain and vomiting.  Genitourinary:  Negative for dysuria and hematuria.  Musculoskeletal:  Negative for arthralgias and back pain.  Skin:  Negative for color change and rash.  Neurological:  Negative for seizures and syncope.  All other systems reviewed and are negative.  Updated Vital Signs BP (!) 159/91 (BP Location: Right Arm)   Pulse 78   Temp 98.5 F (36.9 C) (Oral)   Resp 20   Wt (!) 155 kg   SpO2 100%   BMI 49.03 kg/m   Physical Exam Vitals and nursing note reviewed.  Constitutional:      General: He is not in acute distress.    Appearance: He is well-developed.  HENT:     Head: Normocephalic and atraumatic.   Eyes:     Conjunctiva/sclera: Conjunctivae normal.  Cardiovascular:     Rate and Rhythm: Normal rate and regular rhythm.     Heart sounds: No murmur heard. Pulmonary:     Effort: Pulmonary effort is normal. No respiratory distress.     Breath sounds:  Normal breath sounds.  Abdominal:     Palpations: Abdomen is soft.     Tenderness: There is no abdominal tenderness.  Musculoskeletal:        General: No swelling.       Arms:     Cervical back: Neck supple.  Skin:    General: Skin is warm and dry.     Capillary Refill: Capillary refill takes less than 2 seconds.  Neurological:     Mental Status: He is alert.  Psychiatric:        Mood and Affect: Mood normal.     (all labs ordered are listed, but only abnormal results are displayed) Labs Reviewed  BASIC METABOLIC PANEL WITH GFR  CBC  TROPONIN I (HIGH SENSITIVITY)  TROPONIN I (HIGH SENSITIVITY)    EKG: None  Radiology: CT Chest Wo Contrast Result Date: 04/05/2024 EXAM: CT CHEST WITHOUT CONTRAST 04/05/2024 01:27:49 PM TECHNIQUE: CT of the chest was performed without the administration of intravenous contrast. Multiplanar reformatted images are provided for review. Automated exposure control, iterative reconstruction, and/or weight based adjustment of the mA/kV was utilized to reduce the radiation dose to as low as reasonably achievable. COMPARISON: None available. CLINICAL HISTORY: Chest trauma, blunt. Pt fell out of bed while sleeping and hit side table next to bed. Pt complains of lower lip pain. Hit lip on side table and hit chest on floor. Denies LOC. Denies Blood thinners. Complains of chest pain worse with movement. FINDINGS: MEDIASTINUM: Heart and pericardium are unremarkable. The central airways are clear. LYMPH NODES: No mediastinal, hilar or axillary lymphadenopathy. LUNGS AND PLEURA: No focal consolidation or pulmonary edema. No pleural effusion or pneumothorax. SOFT TISSUES/BONES: Findings of rib fractures are noted. UPPER ABDOMEN: Incidental note of a duodenal diverticulum is made. No inflammatory changes are associated. IMPRESSION: 1. No acute intrathoracic traumatic abnormality. 2. 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 3. 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 Electronically signed by: Lonni Necessary MD 04/05/2024 02:04 PM EDT RP Workstation: [id number]   CT Head Wo Contrast Result Date: 04/05/2024 CLINICAL DATA:  Head trauma, moderate-severe; Neck trauma, dangerous injury mechanism (Age 54-64y). Fall from bed. Hit lip on bedside table and chest on floor. EXAM: CT HEAD WITHOUT CONTRAST CT CERVICAL SPINE WITHOUT CONTRAST TECHNIQUE: Multidetector CT imaging of the head and cervical spine was performed following the standard protocol without intravenous contrast. Multiplanar CT image reconstructions of the cervical spine were also generated. RADIATION DOSE REDUCTION: This exam was performed according to the departmental dose-optimization program which includes automated exposure control, adjustment of the mA and/or kV according to patient size and/or use of iterative reconstruction technique. COMPARISON:  None Available. FINDINGS: CT HEAD FINDINGS Brain: There is no  evidence of an acute infarct, intracranial hemorrhage, mass, midline shift, or extra-axial fluid collection. Cerebral volume is normal. The ventricles are normal in size. Vascular: Calcified atherosclerosis at the skull base. No hyperdense vessel. Skull: No acute fracture or suspicious lesion. Sinuses/Orbits: Paranasal sinuses and mastoid air cells are clear. Unremarkable orbits. Other: Suspected mild scarring in the right occipital scalp. CT CERVICAL SPINE FINDINGS Alignment: Straightening/mild reversal of the normal cervical lordosis. No listhesis. Skull base and vertebrae: No acute fracture or suspicious lesion. Soft tissues and spinal canal: No prevertebral fluid or swelling. No visible canal hematoma. Disc levels: Cervical spondylosis, including large anterior vertebral osteophytes at C4-5 and C5-6. Congenitally narrow cervical spinal canal. Suspected moderate spinal stenosis at C3-4. Upper chest: Reported separately on today's dedicated  CT of the chest. Other: None. IMPRESSION: 1. No evidence of acute intracranial abnormality. 2. No acute cervical spine fracture or subluxation. Electronically Signed   By: Dasie Hamburg M.D.   On: 04/05/2024 13:47   CT Cervical Spine Wo Contrast Result Date: 04/05/2024 CLINICAL DATA:  Head trauma, moderate-severe; Neck trauma, dangerous injury mechanism (Age 29-64y). Fall from bed. Hit lip on bedside table and chest on floor. EXAM: CT HEAD WITHOUT CONTRAST CT CERVICAL SPINE WITHOUT CONTRAST TECHNIQUE: Multidetector CT imaging of the head and cervical spine was performed following the standard protocol without intravenous contrast. Multiplanar CT image reconstructions of the cervical spine were also generated. RADIATION DOSE REDUCTION: This exam was performed according to the departmental dose-optimization program which includes automated exposure control, adjustment of the mA and/or kV according to patient size and/or use of iterative reconstruction technique. COMPARISON:  None Available. FINDINGS: CT HEAD FINDINGS Brain: There is no evidence of an acute infarct, intracranial hemorrhage, mass, midline shift, or extra-axial fluid collection. Cerebral volume is normal. The ventricles are normal in size. Vascular: Calcified atherosclerosis at the skull base. No hyperdense vessel. Skull: No acute fracture or suspicious lesion. Sinuses/Orbits: Paranasal sinuses and mastoid air cells are clear. Unremarkable orbits. Other: Suspected mild scarring in the right occipital scalp. CT CERVICAL SPINE FINDINGS Alignment: Straightening/mild reversal of the normal cervical lordosis. No listhesis. Skull base and vertebrae: No acute fracture or suspicious lesion. Soft tissues and spinal canal: No prevertebral fluid or swelling. No visible canal hematoma. Disc levels: Cervical spondylosis, including large anterior vertebral osteophytes at C4-5 and C5-6. Congenitally narrow cervical spinal canal. Suspected moderate spinal stenosis at  C3-4. Upper chest: Reported separately on today's dedicated CT of the chest. Other: None. IMPRESSION: 1. No evidence of acute intracranial abnormality. 2. No acute cervical spine fracture or subluxation. Electronically Signed   By: Dasie Hamburg M.D.   On: 04/05/2024 13:47     Procedures   Medications Ordered in the ED - No data to display                                  Medical Decision Making Amount and/or Complexity of Data Reviewed Labs: ordered.    Endorses mechanical fall.  Patient states that he was asleep last night.  Subsequently rolled out of bed and landed face first onto the floor.  Since then, has been having pain in the middle of his chest whenever he moves certain directions.  Did not have any kind of chest pain before the event.  No exertional shortness of breath or chest pain.  No pleuritic chest pain or hemoptysis.  Denies any ACS history.  Patient states that he  is fomites at rest but whenever he moves certain ways such as whenever he put his arms behind his back he subsequently has pain midsternal area.  This and present since the fall.  Otherwise, did not lose consciousness.  Has some pain in lower lip.  No midline neck pain.  No thoracic or lumbar pain.   I think this is most likely musculoskeletal but given patient's chest pain, did obtain EKG.  EKG shows some nonspecific T wave inversions.  This may have been seen in the past but maybe little bit more pronounced than baseline.   He does have risk factors for ACS, therefore, we will obtain troponin x 2.  He does endorse some slight orthopnea as well.  No pleural effusion.   Otherwise, I do think this most likely musculoskeletal given hurts with certain movements.  No concerns or PE.  No risk factors for this.  No concerns for Of aortic pathology.   Signed out pending Troponin repeat      Final diagnoses:  Chest pain, unspecified type    ED Discharge Orders          Ordered    Ambulatory referral to  Cardiology        04/05/24 2230               Simon Lavonia SAILOR, MD 04/05/24 [phone]

## 2024-04-05 NOTE — Discharge Instructions (Addendum)
 You do have a duodenal diverticulum.  This can be followed up with gastroenterology.  I do think this is likely musculoskeletal given reproducibility of the pain.  Nevertheless, please follow-up with cardiology as well.  They will give you a call to establish care. It is important that you do this because of your risk factors.   If you have any, worsening chest pain or shortness of breath and please come back to the ED for further evaluation.

## 2024-04-05 NOTE — ED Provider Triage Note (Signed)
 Emergency Medicine Provider Triage Evaluation Note  JOSEEDUARDO BRIX , a 63 y.o. male  was evaluated in triage.  Pt complains of fell out of bed at 5 am now having neck pain and chest pain. Fell onto his chest and had hyperextension of his neck, face/ lip hit against the bed stand. No LOC, no BT. Neck pain worse with extension, no midline tenderness and no radicular symptoms. Chest pain over sternum and right chest.   Review of Systems  Positive: Neck pain, CP Negative: LOC   Physical Exam  BP (!) 153/90   Pulse 85   Temp 98.5 F (36.9 C) (Oral)   Resp 20   Wt (!) 155 kg   SpO2 96%   BMI 49.03 kg/m  Gen:   Awake, no distress   Resp:  Normal effort  MSK:   Moves extremities without difficulty  Other:  Mild TTP over sternum.   Medical Decision Making  Medically screening exam initiated at 1:17 PM.  Appropriate orders placed.  STACE PEACE was informed that the remainder of the evaluation will be completed by another provider, this initial triage assessment does not replace that evaluation, and the importance of remaining in the ED until their evaluation is complete.  Since patient is going to CT scanner will obtain chest CT to rule out occult fracture.    Shermon Warren SAILOR, PA-C 04/05/24 1321

## 2024-04-06 LAB — TROPONIN I (HIGH SENSITIVITY): Troponin I (High Sensitivity): 9 ng/L (ref <18)

## 2024-04-06 NOTE — ED Provider Notes (Signed)
 Patient signed out pending repeat troponin.  Initially presented for fall.  Also endorses some chest pain.  Repeat troponin is negative.  Will discharge per Dr. Delane discharge instructions.   Bari Charmaine FALCON, MD 04/06/24 [phone]

## 2024-05-15 NOTE — Progress Notes (Unsigned)
 Cardiology Office Note    Date:  05/16/2024  ID:  EMRAN MOLZAHN, DOB 08-Dec-1960, MRN [id number] PCP:  [facility]  Cardiologist:  None - New  Chief Complaint: evaluate chest pain, abnormal eKG  History of Present Illness: .    [name] is a 63 y.o. male with visit-pertinent history of HTN, HLD, morbid obesity, ?DM vs pre-DM seen for evaluation of chest pain at the request of Dr. Bari. Has no prior cardiac history.   He was seen in the ED 04/05/24 with mechanical fall and subsequent positionally-related chest pain. CT head/c-spine/chest without acute abnormalities (though report comments rib fractures noted), with incidental diffuse idiopathic skeletal hyperostosis (DISH). Troponins were negative. EKG showed NSR 77bpm, ST depression/TWI inferiorly, V5-V6 with ST upsloping I, avL, with similar abnormalities seen 05/2023 except V5-V6 changes were new.  He reports that around the time of his fall, he had on/off chest pain for about a week that was specifically reproducible with minimal movements including bending/twisting. There was no exertional component. Once his neck/back issues improved, the chest pain did too and it has not recurred. He denies any dyspnea, orthopnea, PND, palpitations or syncope. Activity is very limited due to chronic knee pain, walks with cane. Has been told he can't have surgery on this until he loses more weight. There is some confusion around his medication list. He believes he is taking losartan 100mg  and hydrochlorothiazide 25mg  daily, but at some point someone wanted him to switch to olmesartan 20mg /hydrochlorothiazide 12.5mg  dose which he never did because he wanted to use up his other prescription first. The olmesartan/hydrochlorothiazide was on his list as of 11/2023. However, he is not entirely sure what he is taking. He did not take any of his medication today. BP 148/98, recheck 146/96. Feeling fine today. Also reports at some point he was on a  cholesterol medicine that didn't get refilled and he had not yet touched base with PCP about this. Does not have a physical scheduled anytime soon. + Snoring. Thinks he had a sleep study many years ago but does not recall the result.   Family History: mother with HTN Tobacco: none Alcohol: extremely rare  Drug use: none Social: has 4 children ranging 2 months to 33 years old, 2 step children  Labwork independently reviewed: 03/2024 trops neg x 2, CBC BMET wnl K 4.3 05/2023 TSH, free T4 ok, trig 100, LDL 152  ROS: .    Please see the history of present illness.  All other systems are reviewed and otherwise negative.  Studies Reviewed: SABRA    EKG:  EKG is ordered today, personally reviewed, demonstrating:  EKG Interpretation Date/Time:  Tuesday May 16 2024 09:09:54 EST Ventricular Rate:  85 PR Interval:  146 QRS Duration:  100 QT Interval:  368 QTC Calculation: 437 R Axis:   -9  Text Interpretation: Normal sinus rhythm Moderate voltage criteria for LVH, may be normal variant ( R in aVL , Cornell product ) Nonspecific ST and T wave abnormality inferiorly, V5-V6 similar to prior Confirmed by [name] [phone]) on 05/16/2024 9:12:52 AM   04/05/2024 -  EKG showed NSR 77bpm, ST depression/TWI inferiorly, V5-V6 with ST upsloping I, avL, with similar abnormalities seen 05/2023 except V5-V6 changes were new.  CV Studies: Cardiac studies reviewed are outlined and summarized above. Otherwise please see EMR for full report.   Current Reported Medications:.    Current Meds  Medication Sig   albuterol  (VENTOLIN  HFA) 108 (90 Base) MCG/ACT inhaler  Inhale 1 puff into the lungs 4 (four) times daily.   aspirin  EC 81 MG tablet Take 81 mg by mouth daily. Swallow whole.   cyclobenzaprine  (FLEXERIL ) 5 MG tablet Take 1 tablet (5 mg total) by mouth 3 (three) times daily as needed.   meloxicam  (MOBIC ) 7.5 MG tablet Take 7.5 mg by mouth daily.   Naproxen Sodium (ALEVE PO) Take by mouth.    oxyCODONE-acetaminophen  (PERCOCET) 10-325 MG tablet Take 1 tablet by mouth 4 (four) times daily as needed.   Unclear whether he is taking   Physical Exam:    VS:  BP (!) 146/96   Pulse 81   Ht 5' 10 (1.778 m)   Wt (!) 339 lb (153.8 kg)   SpO2 94%   BMI 48.64 kg/m    Wt Readings from Last 3 Encounters:  05/16/24 (!) 339 lb (153.8 kg)  04/05/24 (!) 341 lb 11.4 oz (155 kg)  12/08/23 (!) 343 lb (155.6 kg)    GEN: Well nourished, well developed in no acute distress NECK: No JVD; No carotid bruits CARDIAC: RRR, no murmurs, rubs, gallops RESPIRATORY:  Clear to auscultation without rales, wheezing or rhonchi  ABDOMEN: Soft, non-tender, non-distended EXTREMITIES:  No edema; No acute deformity   Asessement and Plan:.    1. Atypical chest pain, nonspecific EKG changes - EKG is suspicious for changes of left ventricular hypertrophy, potentially due to uncontrolled HTN +/- OSA over time. His chest pain sounds musculoskeletal around the time of his fall. Troponins were reassuring. It has not recurred since he recovered from his fall. Denies exertional angina or dyspnea, however, activity is limited by chronic knee pain. Will start with echocardiogram given his abnormal EKG. If LVEF is abnormal, will need to pursue consideration of more definitive testing such as cardiac catheterization. If LVEF is normal, will plan coronary CTA. He is a poor candidate for any treadmill based tests given his knee issues. Need to work on risk factor modification as below. I asked if he would like us  to help steer that ship versus get back with primary care and he would like our assistance.  2. Essential HTN - uncontrolled, BP 148/98, recheck 146/96. As above, unclear what he is actually taking. He agrees to check his med list at home and call us /MyChart us  today with the list. He did not take any medications today. He thinks he is on losartan 100mg  / hydrochlorothiazide 25mg  daily with a prior plan to switch to  olmesartan 20/hydrochlorothiazide 12.5mg  daily but that does not really make sense as the switch would be a transition to a lower overall equivalent dose. Will await current med list to make further recommendations. We discussed as above that EKG changes are suspicious for LVH potentially secondary to uncontrolled HTN over time. Discussed CV risks if left uncontrolled. TSH in 05/2023 with similar BP was normal.  3. Hyperlipidemia - reports he was previously on medicine for this but does not know what happened to it. Med rec is challenging at this time. Unable to see [facility] records. He will review his medication list and call us  with an update. Will recheck CMET and lipid when he comes for echocardiogram. It's unclear if he has formal DM or pre-DM; prior A1cs <6.5. Anticipate likely initiation of statin tailored to updated lipid results.  4. Morbid obesity with sleep disordered breathing - previously followed with [facility], encouraged to return there. High risk for untreated OSA. STOPBANG 6. Will pursue sleep study. He is  agreeable to home [place].    Disposition: F/u with me 6-8 weeks. He states he sees his pain doctor tomorrow. I encouraged him to let them know about the finding of DISH seen on CT (wrote on AVS).   Signed, Keyna Blizard N Margeret Stachnik, PA-C

## 2024-05-16 ENCOUNTER — Encounter: Payer: Self-pay | Admitting: Physician Assistant

## 2024-05-16 ENCOUNTER — Ambulatory Visit: Attending: Physician Assistant | Admitting: Physician Assistant

## 2024-05-16 VITALS — BP 146/96 | HR 81 | Ht 70.0 in | Wt 339.0 lb

## 2024-05-16 DIAGNOSIS — G4730 Sleep apnea, unspecified: Secondary | ICD-10-CM

## 2024-05-16 DIAGNOSIS — I10 Essential (primary) hypertension: Secondary | ICD-10-CM

## 2024-05-16 DIAGNOSIS — E785 Hyperlipidemia, unspecified: Secondary | ICD-10-CM | POA: Diagnosis not present

## 2024-05-16 DIAGNOSIS — R0789 Other chest pain: Secondary | ICD-10-CM

## 2024-05-16 DIAGNOSIS — R9431 Abnormal electrocardiogram [ECG] [EKG]: Secondary | ICD-10-CM | POA: Diagnosis not present

## 2024-05-16 NOTE — Patient Instructions (Addendum)
 Medication Instructions:  Your physician recommends that you continue on your current medications as directed. Please refer to the Current Medication list given to you today.  *If you need a refill on your cardiac medications before your next appointment, please call your pharmacy*  Lab Work: WHEN YOU COME FOR THE EHCOCARDIOGRAM, COME FASTING AND GO TO THE LAB FOR:  CMET & LIPID  If you have labs (blood work) drawn today and your tests are completely normal, you will receive your results only by: MyChart Message (if you have MyChart) OR A paper copy in the mail If you have any lab test that is abnormal or we need to change your treatment, we will call you to review the results.  Testing/Procedures: Your physician has requested that you have an echocardiogram. Echocardiography is a painless test that uses sound waves to create images of your heart. It provides your doctor with information about the size and shape of your heart and how well your heart's chambers and valves are working. This procedure takes approximately one hour. There are no restrictions for this procedure. Please do NOT wear cologne, perfume, aftershave, or lotions (deodorant is allowed). Please arrive 15 minutes prior to your appointment time.  Please note: We ask at that you not bring children with you during ultrasound (echo/ vascular) testing. Due to room size and safety concerns, children are not allowed in the ultrasound rooms during exams. Our front office staff cannot provide observation of children in our lobby area while testing is being conducted. An adult accompanying a patient to their appointment will only be allowed in the ultrasound room at the discretion of the ultrasound technician under special circumstances. We apologize for any inconvenience.   Your physician has recommended that you have a sleep study. This test records several body functions during sleep, including: brain activity, eye movement, oxygen and  carbon dioxide blood levels, heart rate and rhythm, breathing rate and rhythm, the flow of air through your mouth and nose, snoring, body muscle movements, and chest and belly movement.   This will be mailed to you.   Follow-Up: At [facility], you and your health needs are our priority.  As part of our continuing mission to provide you with exceptional heart care, our providers are all part of one team.  This team includes your primary Cardiologist (physician) and Advanced Practice Providers or APPs (Physician Assistants and Nurse Practitioners) who all work together to provide you with the care you need, when you need it.  Your next appointment:   6-8  week(s)  Provider:   Raphael Bring, PA-C          We recommend signing up for the patient portal called MyChart.  Sign up information is provided on this After Visit Summary.  MyChart is used to connect with patients for Virtual Visits (Telemedicine).  Patients are able to view lab/test results, encounter notes, upcoming appointments, etc.  Non-urgent messages can be sent to your provider as well.   To learn more about what you can do with MyChart, go to [email or web address].   Other Instructions Please review your medication bottles when you get home. Call us  or send a mychart message with your complete list so we can make sure what we have is accurate.  In the ER, your CT scan picked up a condition of the skeleton called diffuse idiopathic skeletal hyperostosis (DISH) - please talk to your pain doctor/primary care provider about this.

## 2024-05-17 ENCOUNTER — Telehealth: Payer: Self-pay | Admitting: Physician Assistant

## 2024-05-17 DIAGNOSIS — I10 Essential (primary) hypertension: Secondary | ICD-10-CM

## 2024-05-17 NOTE — Telephone Encounter (Signed)
 Patient called to report the medications he is taking as requested by D.Dunn, PA  aspirin  EC 81 MG tablet  meloxicam  (MOBIC ) 7.5 MG tablet  olmesartan-hydrochlorothiazide (BENICAR HCT) 20-12.5 MG tablet  oxyCODONE-acetaminophen  (PERCOCET) 10-325 MG tablet  [facility]

## 2024-05-17 NOTE — Telephone Encounter (Signed)
 Medication list reviewed with patient and udpated. Forwarded to [employer].

## 2024-05-17 NOTE — Telephone Encounter (Addendum)
 Thank you.  Please increase olmesartan/hydrochlorothiazide - have him take 2 of his 20/12.5mg  tablets until used up then start new 40/25mg  tablet. BMET 1 week.  Needs to get an arm BP cuff (not a wrist one) and keep a log of his blood pressure every day at least 3 hours after AM dose of medication. Relay blood pressure readings 10 days after increasing dose.

## 2024-05-18 ENCOUNTER — Other Ambulatory Visit (HOSPITAL_COMMUNITY): Payer: Self-pay

## 2024-05-18 MED ORDER — OLMESARTAN MEDOXOMIL-HCTZ 40-25 MG PO TABS
1.0000 | ORAL_TABLET | Freq: Every day | ORAL | 3 refills | Status: AC
Start: 2024-05-18 — End: ?
  Filled 2024-05-18: qty 90, 90d supply, fill #0

## 2024-05-18 MED ORDER — OMRON 3 SERIES BP MONITOR DEVI
0 refills | Status: AC
  Filled 2024-05-18 – 2024-07-18 (×2): qty 1, 30d supply, fill #0

## 2024-05-18 NOTE — Addendum Note (Signed)
 Addended by: DRENA MARTINIS, Vestal Crandall L on: 05/18/2024 09:54 AM   Modules accepted: Orders

## 2024-05-18 NOTE — Telephone Encounter (Signed)
 Called patient back about [name]'s advisement. Patient will increase is Benicar to 40/25 mg daily. Placed order for BP monitor for patient to pick up at [facility] pharmacy. Patient will get lab the end of next week. He will keep a daily log after he gets BP monitor. Patient verbalized understanding.

## 2024-05-30 ENCOUNTER — Other Ambulatory Visit (HOSPITAL_COMMUNITY): Payer: Self-pay

## 2024-06-20 ENCOUNTER — Ambulatory Visit (HOSPITAL_COMMUNITY)

## 2024-07-14 ENCOUNTER — Telehealth: Payer: Self-pay | Admitting: Physician Assistant

## 2024-07-14 NOTE — Telephone Encounter (Signed)
 Pt would like a c/b regarding [name] Study. Pt states that his phone is not compatible with machine, so there he cannot get it to work. Please advise

## 2024-07-17 NOTE — Telephone Encounter (Signed)
**Note De-Identified Damaya Channing Obfuscation** No answer so I left a message on the pts VM asking him to call Macario back at [employer], PA-c's office at [facility] at [phone].

## 2024-07-17 NOTE — Progress Notes (Signed)
 " Cardiology Office Note   Date:  07/18/2024  ID:  [name], DOB 15-Jun-1961, MRN [id number] PCP: [facility]  [facility] Providers Cardiologist:  None     History of Present Illness [name] is a 64 y.o. male with history of hypertension, obesity, prediabetes, and hyperlipidemia.  He was seen in the ED 04/01/2024 with mechanical fall and subsequent positionally related chest pain.  CT head/C-spine/chest without acute abnormalities (rib fractures noted), with incidental diffuse idiopathic skeletal hyperostosis (DISH).  Troponins were negative.  EKG showed normal sinus rhythm, ST depressions/TWI inferiorly, V5 through V6 with ST upsloping 1, aVL, with similar abnormality seen 05/2023.After his fall he had on/off chest pain for about a week that was specifically reproducible with minimal movements including bending/twisting.  This resolved once fall injuries healed.  He was initially seen in office by Christopher Bring, PA-C 05/16/2024 for evaluation of chest pain that occurred after falling out of the bed. He had no prior cardiac history.  EKG suspicious for changes of LVH potentially due to uncontrolled hypertension +/- OSA over time.  He was unsure of medication regimen at the time of visit. Benicar  increased to 40-25 mg after medication regimen sent in via telephone.  Echo recommended with LVEF guiding treatment of cardiac catheterization versus coronary CTA.    He presents today for 6 to 8-week follow-up in the setting of hypertension. 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 He has noted some SOB when getting up in the AM and getting out of the car. 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 He has not been checking his BP at home as he does not have a BP cuff. He does have chronic knee pain and he is working to lose weight to qualify for surgery. He was on Wegovy, but had to stop it due to insurance coverage. Some trace edema noted on exam. He had at home sleep  study ordered and was unable to work the app on his phone. He denies chest pain, fatigue, palpitations, melena, hematuria, hemoptysis, diaphoresis, weakness, presyncope, syncope, orthopnea, and PND.  ROS: All systems negative unless otherwise indicated in HPI.  Studies Reviewed EKG Interpretation Date/Time:  Tuesday July 18 2024 10:57:53 EST Ventricular Rate:  80 PR Interval:  150 QRS Duration:  100 QT Interval:  378 QTC Calculation: 435 R Axis:   5  Text Interpretation: Normal sinus rhythm Minimal voltage criteria for LVH, may be normal variant ( R in aVL ) T wave abnormality, consider inferior ischemia When compared with ECG of 16-May-2024 09:09, T wave inversion more evident in Inferior leads Confirmed by [name] [id number]) on 07/18/2024 11:05:24 AM        Risk Assessment/Calculations       STOP-Bang Score:  6      Physical Exam VS:  BP 138/88   Pulse 80   Ht 5' 10 (1.778 m)   Wt (!) 354 lb (160.6 kg)   SpO2 95%   BMI 50.79 kg/m        Wt Readings from Last 3 Encounters:  07/18/24 (!) 354 lb (160.6 kg)  05/16/24 (!) 339 lb (153.8 kg)  04/05/24 (!) 341 lb 11.4 oz (155 kg)    GEN: Well nourished, well developed in no acute distress NECK: No JVD; No carotid bruits CARDIAC: RRR, no murmurs, rubs, gallops RESPIRATORY:  Clear to auscultation without rales, wheezing or rhonchi  ABDOMEN: Soft, non-tender, non-distended EXTREMITIES:  Bilateral LE trace  edema; No deformity   ASSESSMENT AND PLAN  Hypertension- BP today was 138/88. He does not check BP at home, as he does not have BP cuff. He denies CP and palpitations today. Encouraged him to get BP cuff from downstairs. Discussed to monitor BP at home at least 2 hours after medications and sitting for 5-10 minutes. Educated on low salt diet and increase exercise as tolerated. Continue Benicar  40-25 mg.   DOE- He notes some SOB when getting out of bed in the AM and when getting out of the car. He attributes this to  deconditioning. He had bilateral LE trace edema noted on exam. He does have chronic bilateral knee pain. EKG today unchanged from 05/2024 showing NSR with TWI inferiorly. He is currently trying to lose weight to qualify for surgery. Will obtain echo for further evaluation. If EF okay, can consider coronary CTA is symptoms persist.   Hyperlipidemia- Most recent LDL 152 05/25/23. Lipid panel order already placed. Encouraged lab work.  Prediabetes-  Last A1C 6.0 05/25/2023. Continue to follow with PCP.   OSA- He did have at home sleep study order placed, and was unable to work the app on his phone. [facility] ordered an in office sleep study. Advised patient to pursue the route he feels would be most convenient for him.        Dispo: Follow up in 2-3 months following echo.   Signed, Mardy KATHEE Pizza, FNP  "

## 2024-07-18 ENCOUNTER — Ambulatory Visit: Attending: General Practice

## 2024-07-18 ENCOUNTER — Ambulatory Visit: Admitting: Physician Assistant

## 2024-07-18 ENCOUNTER — Other Ambulatory Visit (HOSPITAL_COMMUNITY): Payer: Self-pay

## 2024-07-18 ENCOUNTER — Encounter: Payer: Self-pay | Admitting: General Practice

## 2024-07-18 VITALS — BP 138/88 | HR 80 | Ht 70.0 in | Wt 354.0 lb

## 2024-07-18 DIAGNOSIS — R0609 Other forms of dyspnea: Secondary | ICD-10-CM | POA: Diagnosis not present

## 2024-07-18 DIAGNOSIS — E785 Hyperlipidemia, unspecified: Secondary | ICD-10-CM

## 2024-07-18 DIAGNOSIS — R7303 Prediabetes: Secondary | ICD-10-CM | POA: Diagnosis not present

## 2024-07-18 DIAGNOSIS — I10 Essential (primary) hypertension: Secondary | ICD-10-CM | POA: Diagnosis not present

## 2024-07-18 DIAGNOSIS — G4733 Obstructive sleep apnea (adult) (pediatric): Secondary | ICD-10-CM | POA: Diagnosis not present

## 2024-07-18 NOTE — Patient Instructions (Addendum)
 Medication Instructions:  Your physician recommends that you continue on your current medications as directed. Please refer to the Current Medication list given to you today.  *If you need a refill on your cardiac medications before your next appointment, please call your pharmacy*  Lab Work: None  If you have labs (blood work) drawn today and your tests are completely normal, you will receive your results only by: MyChart Message (if you have MyChart) OR A paper copy in the mail If you have any lab test that is abnormal or we need to change your treatment, we will call you to review the results.  Testing/Procedures: Echocardiogram Your physician has requested that you have an echocardiogram. Echocardiography is a painless test that uses sound waves to create images of your heart. It provides your doctor with information about the size and shape of your heart and how well your hearts chambers and valves are working. This procedure takes approximately one hour. There are no restrictions for this procedure. Please do NOT wear cologne, perfume, aftershave, or lotions (deodorant is allowed). Please arrive 15 minutes prior to your appointment time.  Please note: We ask at that you not bring children with you during ultrasound (echo/ vascular) testing. Due to room size and safety concerns, children are not allowed in the ultrasound rooms during exams. Our front office staff cannot provide observation of children in our lobby area while testing is being conducted. An adult accompanying a patient to their appointment will only be allowed in the ultrasound room at the discretion of the ultrasound technician under special circumstances. We apologize for any inconvenience.   Follow-Up: At [facility], you and your health needs are our priority.  As part of our continuing mission to provide you with exceptional heart care, our providers are all part of one team.  This team includes your primary  Cardiologist (physician) and Advanced Practice Providers or APPs (Physician Assistants and Nurse Practitioners) who all work together to provide you with the care you need, when you need it.  Your next appointment:   2 - 3 months, to be scheduled after the echocardiogram  Provider:   Josefa Beauvais, NP     Other Instructions  Check blood pressure daily, log and send readings to the office.  [employer]  Youth Worker for [employer] Mailing Address:  [address];   [address]  [place], [place] [address]  Tel [phone] Fx [phone]     High Quality Legwear for Today's Active Lifestyles Maximum Compression at the ankle. Compression lessens gradually up the leg.   We manufacture a wide range of compression hosiery for men and women in  different styles, constructions and levels of support.  How Compression Hosiery Works Regulatory Affairs Officer, [employer]. compression hosiery works by applying graduated pressure to the  muscles and veins in the legs.  When the calf muscle contracts such as during walking  the compression hosiery will give and then return to its original position. By doing so  the hosiery is assists your body's circulatory wellness.  The result is increased leg health and vitality.   Maximum Compression at the ankle Compression lessens gradually up the leg  We Offer: Sheer & Opaque Stockings       COLORS:  Nude, black, white and misc. prints Below Knee Thigh High Pantyhose  High Quality Legwear for Today's Active Lifestyles We manufacture a wide range of compression hosiery for men and women in  different styles, construction sand levels of support.  Socks:                     Sheer & Opaque   Compression Levels Include:                                  Stockings Men's               Below Knee                8-15 mmHg   Women's         Thigh High                 15-20 mmHg  Unisex             Pantyhose                  20-30 mmHg                                                                      30-40 mmHg  4 Simple Ways to Order   Email  [email or web address] Mail/Email orders are subject to processing and handling charges. Allow 7-10 days for receipt.  Phone [phone]  Please allow 24 hours for return call.   In Person  We recommend calling prior to your visit to confirm store hours as they may change due to holiday, weather, and maintenance.   By Mail When placing an order, please have the following information available. Our representatives are available to assist.     Measurements    THIGH      in.   CALF        in.   ANKLE     in.    Compression  8-15 mmHg 15-20 mmHg**   20-30 mmHg 30-40 mmHg   WOMEN'S MEN'S  Shoe Size Sock Size Shoe Size Sock Size  4 - 5 Small 7.5 and Under Small  5.5 - 7.5 Medium 8 - 10 Medium  8 - 10 Large 10.5 - 12 Large  10.5 and Over X-Large 12.5 and Over X-Large   Knee High Size Chart  Length from CALF MEASUREMENT  floor to bend   in knee. 11 12 13 14 15 16 17 18 19 20 21 22  14  S S S S M M L L L XL XL XL  15 S S S M M L L L XL XL XXL XXL  16 S S M M M L L L XL XXL XXL XXL  17 S M M M M L L XL XL XXL XXL XXL  18 M M M M L L L XL XL XXL XXL XXL  19 M M M M L L XL XL XL XXL XXL XXL   Thigh High Circumference Sizing Chart                 S M L XL XXL  ANKLE 6.5 - 8 8 - 9.5 9.5 - 11 11 - 12.5 12.5 - 14  CALF 10.5 - 14.5 11.5 - 15.5 12.5 - 17 13.5 - 17.5 14.5 - 19.5  THIGH 15.5 - 22 17.5 - 24 19.5 - 26 22 - 28 26 - 32  HIP UP TO 40 UP TO 44 UP TO 48' UP TO 52 UP TO 56   Pantyhose Size Chart  Height Petite Medium Tall X-Tall Queen Queen +   Weight Weight Weight Weight Weight Weight  4'11 95-130 135      5'0 95-125 130-145   170-185   5'1 90-120 125-155 160-165  170-195   5'2 90-115 120-145 150-165  170-195   5'3 90-110 115-140 145-165  170-200 200-225  5'4 100-105 110-135 140-160 165  170-200 200-225  5'5 100 105-130 135-160 165 170-200 200-225  5'6  110-125 130-155 160-165 170-200 200-225  5'7  110-120 125-150 155-165 170-200 195-225  5'8   120-145 150-165 170-200 190-225  5'9   125-140 145-170 175-190 185-220  5'10   125-135 140-185  185-215  5'11   130-135 140-185  190-210

## 2024-07-28 ENCOUNTER — Other Ambulatory Visit (HOSPITAL_COMMUNITY): Payer: Self-pay

## 2024-07-31 NOTE — Telephone Encounter (Signed)
**Note De-Identified Negar Sieler Obfuscation** I called the pt but received a message to try my call again later so I have sent him a [facility] message concerning the [employer]-HST.

## 2024-08-22 ENCOUNTER — Ambulatory Visit (HOSPITAL_COMMUNITY)

## 2024-10-31 ENCOUNTER — Ambulatory Visit: Admitting: General Practice
